# Patient Record
Sex: Female | Born: 1960 | Race: Black or African American | Hispanic: No | Marital: Married | Smoking: Former smoker
Health system: Southern US, Community
[De-identification: ages and names within clinical notes are randomized; demographics above are authoritative.]

## PROBLEM LIST (undated history)

## (undated) DIAGNOSIS — T7840XA Allergy, unspecified, initial encounter: Secondary | ICD-10-CM

## (undated) DIAGNOSIS — Z8701 Personal history of pneumonia (recurrent): Secondary | ICD-10-CM

## (undated) DIAGNOSIS — Z202 Contact with and (suspected) exposure to infections with a predominantly sexual mode of transmission: Secondary | ICD-10-CM

## (undated) DIAGNOSIS — D126 Benign neoplasm of colon, unspecified: Secondary | ICD-10-CM

## (undated) DIAGNOSIS — I38 Endocarditis, valve unspecified: Secondary | ICD-10-CM

## (undated) DIAGNOSIS — R21 Rash and other nonspecific skin eruption: Secondary | ICD-10-CM

## (undated) DIAGNOSIS — I10 Essential (primary) hypertension: Secondary | ICD-10-CM

## (undated) DIAGNOSIS — R609 Edema, unspecified: Secondary | ICD-10-CM

## (undated) DIAGNOSIS — L293 Anogenital pruritus, unspecified: Secondary | ICD-10-CM

## (undated) DIAGNOSIS — J189 Pneumonia, unspecified organism: Secondary | ICD-10-CM

## (undated) DIAGNOSIS — M1990 Unspecified osteoarthritis, unspecified site: Secondary | ICD-10-CM

## (undated) DIAGNOSIS — G4730 Sleep apnea, unspecified: Secondary | ICD-10-CM

## (undated) DIAGNOSIS — K219 Gastro-esophageal reflux disease without esophagitis: Secondary | ICD-10-CM

## (undated) DIAGNOSIS — K5730 Diverticulosis of large intestine without perforation or abscess without bleeding: Secondary | ICD-10-CM

## (undated) DIAGNOSIS — D649 Anemia, unspecified: Secondary | ICD-10-CM

## (undated) DIAGNOSIS — N949 Unspecified condition associated with female genital organs and menstrual cycle: Secondary | ICD-10-CM

## (undated) HISTORY — DX: Essential (primary) hypertension: I10

## (undated) HISTORY — DX: Edema, unspecified: R60.9

## (undated) HISTORY — DX: Benign neoplasm of colon, unspecified: D12.6

## (undated) HISTORY — PX: ROTATOR CUFF REPAIR: SHX139

## (undated) HISTORY — DX: Endocarditis, valve unspecified: I38

## (undated) HISTORY — DX: Unspecified condition associated with female genital organs and menstrual cycle: N94.9

## (undated) HISTORY — DX: Anogenital pruritus, unspecified: L29.3

## (undated) HISTORY — DX: Contact with and (suspected) exposure to infections with a predominantly sexual mode of transmission: Z20.2

## (undated) HISTORY — DX: Rash and other nonspecific skin eruption: R21

## (undated) HISTORY — DX: Allergy, unspecified, initial encounter: T78.40XA

## (undated) HISTORY — DX: Gastro-esophageal reflux disease without esophagitis: K21.9

## (undated) HISTORY — PX: KNEE ARTHROSCOPY: SUR90

## (undated) HISTORY — PX: WRIST SURGERY: SHX841

## (undated) HISTORY — DX: Sleep apnea, unspecified: G47.30

## (undated) HISTORY — PX: DIAGNOSTIC LAPAROSCOPY: SUR761

## (undated) HISTORY — DX: Unspecified osteoarthritis, unspecified site: M19.90

## (undated) HISTORY — PX: PARTIAL HYSTERECTOMY: SHX80

## (undated) HISTORY — PX: POLYPECTOMY: SHX149

## (undated) HISTORY — DX: Personal history of pneumonia (recurrent): Z87.01

## (undated) HISTORY — DX: Diverticulosis of large intestine without perforation or abscess without bleeding: K57.30

## (undated) HISTORY — DX: Anemia, unspecified: D64.9

---

## 1996-03-08 HISTORY — PX: CARDIAC VALVE SURGERY: SHX40

## 1998-06-11 ENCOUNTER — Ambulatory Visit (HOSPITAL_COMMUNITY): Admission: RE | Admit: 1998-06-11 | Discharge: 1998-06-11 | Payer: Self-pay | Admitting: *Deleted

## 1998-07-24 ENCOUNTER — Ambulatory Visit (HOSPITAL_COMMUNITY): Admission: RE | Admit: 1998-07-24 | Discharge: 1998-07-24 | Payer: Self-pay | Admitting: Gastroenterology

## 1998-08-08 ENCOUNTER — Encounter: Payer: Self-pay | Admitting: Gastroenterology

## 1998-08-08 ENCOUNTER — Ambulatory Visit (HOSPITAL_COMMUNITY): Admission: RE | Admit: 1998-08-08 | Discharge: 1998-08-08 | Payer: Self-pay | Admitting: Gastroenterology

## 1999-03-29 ENCOUNTER — Encounter: Payer: Self-pay | Admitting: Emergency Medicine

## 1999-03-29 ENCOUNTER — Emergency Department (HOSPITAL_COMMUNITY): Admission: EM | Admit: 1999-03-29 | Discharge: 1999-03-29 | Payer: Self-pay | Admitting: Emergency Medicine

## 2000-11-22 ENCOUNTER — Encounter: Payer: Self-pay | Admitting: Family Medicine

## 2000-11-22 ENCOUNTER — Ambulatory Visit (HOSPITAL_COMMUNITY): Admission: RE | Admit: 2000-11-22 | Discharge: 2000-11-22 | Payer: Self-pay | Admitting: Family Medicine

## 2001-07-16 ENCOUNTER — Emergency Department (HOSPITAL_COMMUNITY): Admission: EM | Admit: 2001-07-16 | Discharge: 2001-07-16 | Payer: Self-pay | Admitting: Emergency Medicine

## 2001-09-05 HISTORY — PX: COLONOSCOPY: SHX174

## 2001-09-15 ENCOUNTER — Encounter: Payer: Self-pay | Admitting: Internal Medicine

## 2001-09-15 ENCOUNTER — Emergency Department (HOSPITAL_COMMUNITY): Admission: EM | Admit: 2001-09-15 | Discharge: 2001-09-15 | Payer: Self-pay | Admitting: Emergency Medicine

## 2002-10-30 ENCOUNTER — Encounter: Payer: Self-pay | Admitting: Family Medicine

## 2002-10-30 ENCOUNTER — Ambulatory Visit (HOSPITAL_COMMUNITY): Admission: RE | Admit: 2002-10-30 | Discharge: 2002-10-30 | Payer: Self-pay | Admitting: Family Medicine

## 2002-12-07 HISTORY — PX: KNEE SURGERY: SHX244

## 2005-02-01 ENCOUNTER — Ambulatory Visit: Payer: Self-pay | Admitting: Family Medicine

## 2005-02-08 ENCOUNTER — Ambulatory Visit: Payer: Self-pay | Admitting: Family Medicine

## 2005-04-15 ENCOUNTER — Ambulatory Visit: Payer: Self-pay | Admitting: Family Medicine

## 2005-04-15 ENCOUNTER — Encounter: Payer: Self-pay | Admitting: Family Medicine

## 2005-04-15 ENCOUNTER — Other Ambulatory Visit: Admission: RE | Admit: 2005-04-15 | Discharge: 2005-04-15 | Payer: Self-pay | Admitting: Family Medicine

## 2005-04-15 LAB — CONVERTED CEMR LAB: Pap Smear: NORMAL

## 2005-04-19 ENCOUNTER — Ambulatory Visit (HOSPITAL_COMMUNITY): Admission: RE | Admit: 2005-04-19 | Discharge: 2005-04-19 | Payer: Self-pay | Admitting: Family Medicine

## 2005-07-09 ENCOUNTER — Ambulatory Visit: Payer: Self-pay | Admitting: Family Medicine

## 2005-10-06 ENCOUNTER — Ambulatory Visit: Payer: Self-pay | Admitting: Gastroenterology

## 2005-10-06 HISTORY — PX: RECTAL POLYPECTOMY: SHX2309

## 2005-10-26 ENCOUNTER — Ambulatory Visit: Payer: Self-pay | Admitting: Gastroenterology

## 2005-10-26 ENCOUNTER — Encounter (INDEPENDENT_AMBULATORY_CARE_PROVIDER_SITE_OTHER): Payer: Self-pay | Admitting: Gastroenterology

## 2005-10-26 LAB — HM COLONOSCOPY

## 2005-12-25 ENCOUNTER — Ambulatory Visit: Payer: Self-pay | Admitting: Family Medicine

## 2006-03-15 ENCOUNTER — Ambulatory Visit: Payer: Self-pay | Admitting: Family Medicine

## 2006-03-22 ENCOUNTER — Ambulatory Visit: Payer: Self-pay | Admitting: Family Medicine

## 2006-03-22 LAB — CONVERTED CEMR LAB
Albumin: 3.7 g/dL (ref 3.5–5.2)
BUN: 11 mg/dL (ref 6–23)
Chloride: 103 meq/L (ref 96–112)
Creatinine, Ser: 1 mg/dL (ref 0.4–1.2)
GFR calc non Af Amer: 64 mL/min
MCHC: 33.3 g/dL (ref 30.0–36.0)
Monocytes Absolute: 0.4 10*3/uL (ref 0.2–0.7)
Monocytes Relative: 6.2 % (ref 3.0–11.0)
Phosphorus: 3.7 mg/dL (ref 2.3–4.6)
RBC: 4.23 M/uL (ref 3.87–5.11)
RDW: 12 % (ref 11.5–14.6)
TSH: 0.75 microintl units/mL (ref 0.35–5.50)

## 2006-04-18 ENCOUNTER — Ambulatory Visit: Payer: Self-pay | Admitting: Family Medicine

## 2006-04-18 LAB — CONVERTED CEMR LAB
Ferritin: 112.4 ng/mL (ref 10.0–291.0)
Hemoglobin: 12.8 g/dL (ref 12.0–15.0)
RBC: 4.34 M/uL (ref 3.87–5.11)

## 2006-04-20 ENCOUNTER — Ambulatory Visit (HOSPITAL_COMMUNITY): Admission: RE | Admit: 2006-04-20 | Discharge: 2006-04-20 | Payer: Self-pay | Admitting: Family Medicine

## 2006-05-16 ENCOUNTER — Ambulatory Visit: Payer: Self-pay | Admitting: Family Medicine

## 2006-10-26 ENCOUNTER — Encounter: Payer: Self-pay | Admitting: Family Medicine

## 2006-10-26 DIAGNOSIS — Z860100 Personal history of colon polyps, unspecified: Secondary | ICD-10-CM | POA: Insufficient documentation

## 2006-10-26 DIAGNOSIS — K5730 Diverticulosis of large intestine without perforation or abscess without bleeding: Secondary | ICD-10-CM

## 2006-10-26 DIAGNOSIS — K219 Gastro-esophageal reflux disease without esophagitis: Secondary | ICD-10-CM | POA: Insufficient documentation

## 2006-10-26 DIAGNOSIS — D649 Anemia, unspecified: Secondary | ICD-10-CM | POA: Insufficient documentation

## 2006-10-26 DIAGNOSIS — I10 Essential (primary) hypertension: Secondary | ICD-10-CM | POA: Insufficient documentation

## 2006-10-27 ENCOUNTER — Telehealth (INDEPENDENT_AMBULATORY_CARE_PROVIDER_SITE_OTHER): Payer: Self-pay | Admitting: *Deleted

## 2006-11-03 ENCOUNTER — Telehealth (INDEPENDENT_AMBULATORY_CARE_PROVIDER_SITE_OTHER): Payer: Self-pay | Admitting: *Deleted

## 2007-01-17 ENCOUNTER — Ambulatory Visit: Payer: Self-pay | Admitting: Family Medicine

## 2007-01-17 DIAGNOSIS — N949 Unspecified condition associated with female genital organs and menstrual cycle: Secondary | ICD-10-CM | POA: Insufficient documentation

## 2007-01-17 DIAGNOSIS — J019 Acute sinusitis, unspecified: Secondary | ICD-10-CM

## 2007-02-01 ENCOUNTER — Telehealth: Payer: Self-pay | Admitting: Family Medicine

## 2007-04-03 ENCOUNTER — Encounter (INDEPENDENT_AMBULATORY_CARE_PROVIDER_SITE_OTHER): Payer: Self-pay | Admitting: *Deleted

## 2007-05-19 ENCOUNTER — Telehealth: Payer: Self-pay | Admitting: Family Medicine

## 2007-09-25 ENCOUNTER — Telehealth (INDEPENDENT_AMBULATORY_CARE_PROVIDER_SITE_OTHER): Payer: Self-pay | Admitting: *Deleted

## 2007-10-03 ENCOUNTER — Ambulatory Visit: Payer: Self-pay | Admitting: Family Medicine

## 2007-10-03 ENCOUNTER — Ambulatory Visit: Payer: Self-pay | Admitting: *Deleted

## 2007-10-03 DIAGNOSIS — L293 Anogenital pruritus, unspecified: Secondary | ICD-10-CM

## 2007-10-03 LAB — CONVERTED CEMR LAB: KOH Prep: NEGATIVE

## 2007-10-04 LAB — CONVERTED CEMR LAB: GC Probe Amp, Genital: NEGATIVE

## 2007-11-16 ENCOUNTER — Telehealth (INDEPENDENT_AMBULATORY_CARE_PROVIDER_SITE_OTHER): Payer: Self-pay | Admitting: *Deleted

## 2008-10-11 ENCOUNTER — Ambulatory Visit: Payer: Self-pay | Admitting: Family Medicine

## 2008-10-11 DIAGNOSIS — R609 Edema, unspecified: Secondary | ICD-10-CM

## 2008-10-18 ENCOUNTER — Ambulatory Visit: Payer: Self-pay | Admitting: Family Medicine

## 2009-11-06 ENCOUNTER — Ambulatory Visit: Payer: Self-pay | Admitting: Family Medicine

## 2009-11-06 DIAGNOSIS — R21 Rash and other nonspecific skin eruption: Secondary | ICD-10-CM | POA: Insufficient documentation

## 2010-02-04 ENCOUNTER — Encounter (INDEPENDENT_AMBULATORY_CARE_PROVIDER_SITE_OTHER): Payer: Self-pay | Admitting: *Deleted

## 2010-02-04 ENCOUNTER — Ambulatory Visit: Payer: Self-pay | Admitting: Family Medicine

## 2010-02-04 ENCOUNTER — Telehealth: Payer: Self-pay | Admitting: Internal Medicine

## 2010-02-04 DIAGNOSIS — K921 Melena: Secondary | ICD-10-CM

## 2010-02-05 ENCOUNTER — Ambulatory Visit: Payer: Self-pay | Admitting: Gastroenterology

## 2010-02-05 DIAGNOSIS — K625 Hemorrhage of anus and rectum: Secondary | ICD-10-CM

## 2010-02-05 LAB — CONVERTED CEMR LAB
Alkaline Phosphatase: 46 units/L (ref 39–117)
Basophils Absolute: 0 10*3/uL (ref 0.0–0.1)
Bilirubin, Direct: 0.1 mg/dL (ref 0.0–0.3)
Calcium: 9 mg/dL (ref 8.4–10.5)
Creatinine, Ser: 1 mg/dL (ref 0.4–1.2)
Eosinophils Absolute: 0.1 10*3/uL (ref 0.0–0.7)
GFR calc non Af Amer: 78.23 mL/min (ref >60)
HCT: 33.9 % — ABNORMAL LOW (ref 36.0–46.0)
Lymphs Abs: 3.4 10*3/uL (ref 0.7–4.0)
MCHC: 34 g/dL (ref 30.0–36.0)
MCV: 85.4 fL (ref 78.0–100.0)
Monocytes Absolute: 0.1 10*3/uL (ref 0.1–1.0)
Monocytes Relative: 1 % — ABNORMAL LOW (ref 3.0–12.0)
Platelets: 257 10*3/uL (ref 150.0–400.0)
RDW: 12.7 % (ref 11.5–14.6)
Sodium: 141 meq/L (ref 135–145)
Total Bilirubin: 0.4 mg/dL (ref 0.3–1.2)

## 2010-02-06 ENCOUNTER — Ambulatory Visit: Payer: Self-pay | Admitting: Gastroenterology

## 2010-02-10 ENCOUNTER — Encounter: Payer: Self-pay | Admitting: Gastroenterology

## 2010-04-07 NOTE — Miscellaneous (Signed)
Summary: anusol supp  Clinical Lists Changes  Medications: Added new medication of ANUSOL-HC 25 MG  SUPP (HYDROCORTISONE ACETATE) use daily as needed - Signed Rx of ANUSOL-HC 25 MG  SUPP (HYDROCORTISONE ACETATE) use daily as needed;  #10 x 1;  Signed;  Entered by: Oda Cogan RN;  Authorized by: Louis Meckel MD;  Method used: [facility] to [facility] [phone]*, [address], [place], [place], [place]  [address], Ph: [phone] or [phone], Fax: [phone]    Prescriptions: ANUSOL-HC 25 MG  SUPP (HYDROCORTISONE ACETATE) use daily as needed  #10 x 1   Entered by:   Oda Cogan RN   Authorized by:   Louis Meckel MD   Signed by:   Oda Cogan RN on 02/06/2010   Method used:   [facility]ronically to        [facility] [phone]* (retail)       [address]       [place], [place]  [address]       Ph: [phone] or [phone]       Fax: [phone]   RxID:   [id number]

## 2010-04-07 NOTE — Letter (Signed)
Summary: [facility] Instructions  [facility]  [address] [place], [place] [address]   Phone: [phone]  Fax: [phone]       Crystal Tucker    1960-08-02    MRN: [id number]        Procedure Day /Date: 02-06-2010     Arrival Time: 8:00 AM      Procedure Time: 9:00 AM     Location of Procedure:                    X      [facility] (4th Floor)                       PREPARATION FOR COLONOSCOPY WITH MOVIPREP     THE DAY BEFORE YOUR PROCEDURE         DATE: 02-05-2010 DAY: Thursday  1.  Drink clear liquids the entire day-NO SOLID FOOD  2.  Do not drink anything colored red or purple.  Avoid juices with pulp.  No orange juice.  3.  Drink at least 64 oz. (8 glasses) of fluid/clear liquids during the day to prevent dehydration and help the prep work efficiently.  CLEAR LIQUIDS INCLUDE: Water Jello Ice Popsicles Tea (sugar ok, no milk/cream) Powdered fruit flavored drinks Coffee (sugar ok, no milk/cream) Gatorade Juice: apple, white grape, white cranberry  Lemonade Clear bullion, consomm, broth Carbonated beverages (any kind) Strained chicken noodle soup Hard Candy                             4.  In the morning, mix first dose of MoviPrep solution:    Empty 1 Pouch A and 1 Pouch B into the disposable container    Add lukewarm drinking water to the top line of the container. Mix to dissolve    Refrigerate (mixed solution should be used within 24 hrs)  5.  Begin drinking the prep at 5:00 p.m. The MoviPrep container is divided by 4 marks.   Every 15 minutes drink the solution down to the next mark (approximately 8 oz) until the full liter is complete.   6.  Follow completed prep with 16 oz of clear liquid of your choice (Nothing red or purple).  Continue to drink clear liquids until bedtime.  7.  Before going to bed, mix second dose of MoviPrep solution:    Empty 1 Pouch A and 1 Pouch B into the disposable container    Add  lukewarm drinking water to the top line of the container. Mix to dissolve    Refrigerate  THE DAY OF YOUR PROCEDURE      DATE: 02-06-2010 DAY: Friday  Beginning at 5:30 AM  (5 hours before procedure):         1. Every 15 minutes, drink the solution down to the next mark (approx 8 oz) until the full liter is complete.  2. Follow completed prep with 16 oz. of clear liquid of your choice.    3. You may drink clear liquids until 8:30 AM  (2 HOURS BEFORE PROCEDURE).    MEDICATION INSTRUCTIONS  Unless otherwise instructed, you should take regular prescription medications with a small sip of water   as early as possible the morning of your procedure.         OTHER INSTRUCTIONS  You will need a responsible adult at least 50 years of age to accompany you and drive  you home.   This person must remain in the waiting room during your procedure.  Wear loose fitting clothing that is easily removed.  Leave jewelry and other valuables at home.  However, you may wish to bring a book to read or  an iPod/MP3 player to listen to music as you wait for your procedure to start.  Remove all body piercing jewelry and leave at home.  Total time from sign-in until discharge is approximately 2-3 hours.  You should go home directly after your procedure and rest.  You can resume normal activities the  day after your procedure.  The day of your procedure you should not:   Drive   Make legal decisions   Operate machinery   Drink alcohol   Return to work  You will receive specific instructions about eating, activities and medications before you leave.    The above instructions have been reviewed and explained to me by   _______________________    I fully understand and can verbalize these instructions _____________________________ Date _________  Appended Document: Colonoscopy     Procedures Next Due Date:    Colonoscopy: 02/2015

## 2010-04-07 NOTE — Letter (Signed)
Summary: Patient Notice- Polyp Results  [facility]  [address] [place], [place] [address]   Phone: [phone]  Fax: [phone]        February 10, 2010 MRN: [id number]    CAMARIE MCTIGUE [address] [place], [place]  [address]    Dear Ms. Dipierro,  I am pleased to inform you that the colon polyp(s) removed during your recent colonoscopy was (were) found to be benign (no cancer detected) upon pathologic examination.  I recommend you have a repeat colonoscopy examination in 5_ years to look for recurrent polyps, as having colon polyps increases your risk for having recurrent polyps or even colon cancer in the future.  Should you develop new or worsening symptoms of abdominal pain, bowel habit changes or bleeding from the rectum or bowels, please schedule an evaluation with either your primary care physician or with me.  Additional information/recommendations:  __ No further action with gastroenterology is needed at this time. Please      follow-up with your primary care physician for your other healthcare      needs.  __ Please call [phone] to schedule a return visit to review your      situation.  __ Please keep your follow-up visit as already scheduled.  __x Continue treatment plan as outlined the day of your exam.  Please call us if you are having persistent problems or have questions about your condition that have not been fully answered at this time.  Sincerely,  Louis Meckel MD  This letter has been electronically signed by your physician.  Appended Document: Patient Notice- Polyp Results Letter mailed

## 2010-04-07 NOTE — Progress Notes (Signed)
Summary: Doc of the day   Phone Note Call from Patient   Caller: [facility]  [phone] Call For: Dr. Marina Goodell Summary of Call: 1 day rectal bleeding and blood in stool. Hx of polyps. Requesting pt. be seen in 1-2 days Initial call taken by: Karna Christmas,  February 04, 2010 12:16 PM  Follow-up for Phone Call        Pt's last colon done in Aug2007 by Dr.Kress . Message left for Shirlee Limerick to call back and we can get pt. in with N.P. tomorrow and the assigned supervising Dr.will become her dr. Follow-up by: Teryl Lucy RN,  February 04, 2010 1:36 PM  Additional Follow-up for Phone Call Additional follow up Details #1::        Pt. given appt. with N.P. for tomorrow. Shirlee Limerick will notify pt. Additional Follow-up by: Teryl Lucy RN,  February 04, 2010 2:28 PM

## 2010-04-07 NOTE — Assessment & Plan Note (Signed)
Summary: BLOOD IN STOOL/CLE   Vital Signs:  Patient profile:   50 year old female Height:      67.5 inches Weight:      227.50 pounds BMI:     35.23 Temp:     98.4 degrees F oral Pulse rate:   76 / minute Pulse rhythm:   regular BP sitting:   160 / 92  (left arm) Cuff size:   large  Vitals Entered By: Selena Batten Dance CMA Kaiea Esselman Dull) (February 04, 2010 11:25 AM) CC: Blood in stool Comments Noted bright red blood this A.M.   History of Present Illness: CC: blood in stool  This am noted bright red blood in stool.  + clots, + stool, looser stool than normal.  No black tarry stool.  Blood all over commode.  Had second stool and still blood, + blood with wiping.  No pain with stooling.  Never had anything like this before.  Slight constipation 1 wk ago with minimal blood with wiping, nothing like today.    No change in diet recently.  + gaining weight.  No fevers/chills, NS, abd pain, n/v.  No melena.  No recent NSAID use.  no feelings of incomplete emptying.  h/o rectal polyp (2007) and colon polyps, last colonoscopy pt unsure but in chart seems to be 2003.  Current Medications (verified): 1)  Omeprazole 20 Mg  Cpdr (Omeprazole) .Marland Kitchen.. 1 By Mouth Every Day. 2)  Nasonex 50 Mcg/act  Susp (Mometasone Furoate) .... Use As Directed 3)  Hydrochlorothiazide 25 Mg  Tabs (Hydrochlorothiazide) .... Take One By Mouth Daily 4)  Tylenol Arthritis Pain 650 Mg Cr-Tabs (Acetaminophen) .... Otc As Directed. 5)  Potassium Gluconate 595 Mg Tabs (Potassium Gluconate) .... Otc Takes One Daily  Allergies: 1)  ! 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  Physical Exam  General:  Well-developed,well-nourished,in no acute distress; alert,appropriate and cooperative throughout examination Eyes:  PERRLA, EOMI, + injection, no conjunctival pallor Mouth:  MMM, no pharyngeal erythema Lungs:  Normal respiratory effort, chest expands symmetrically. Lungs are clear to auscultation, no crackles or wheezes. Heart:  Normal rate and regular rhythm. S1 and S2 normal without gallop, murmur, click, rub or other extra sounds. Abdomen:  Bowel sounds positive,abdomen soft and non-tender without masses, organomegaly or hernias noted. Rectal:  Normal sphincter tone. No rectal masses or tenderness. + inferior external hemorrhoid, noninflammed.  No stool in vault.  + apparent dry red blood on gloved finger on rectal exam Pulses:  2+ rad pulses Extremities:  no pedal edema Skin:  Intact without suspicious lesions or rashes   Impression & Recommendations:  Problem # 1:  HEMATOCHEZIA (ICD-578.1) in 50 yo with h/o colon polyps, rectal polyp, diverticulosis.  no fever/abd pain to suggest diverticulitis.  Looks stable currently, vitals stable (hypertensive but anxious).  referral to GI for eval, seems to  be due for colonoscopy.  Advised red flags to go to ER.  Check CBC, if Hgb low, send to ER.  rectal with ext hemorrhoid, but no bleeding evident on exam.  ? AVM vs diverticulosis bleed.  Orders: TLB-CBC Platelet - w/Differential (85025-CBCD) TLB-BMP (Basic Metabolic Panel-BMET) (80048-METABOL) TLB-Hepatic/Liver Function Pnl  (80076-HEPATIC) Gastroenterology Referral (GI)  Complete Medication List: 1)  Omeprazole 20 Mg Cpdr (Omeprazole) .Marland Kitchen.. 1 by mouth every day. 2)  Nasonex 50 Mcg/act Susp (Mometasone furoate) .... Use as directed 3)  Hydrochlorothiazide 25 Mg Tabs (Hydrochlorothiazide) .... Take one by mouth daily 4)  Tylenol Arthritis Pain 650 Mg Cr-tabs (Acetaminophen) .... Otc as directed. 5)  Potassium Gluconate 595 Mg Tabs (Potassium gluconate) .... Otc takes one daily  Patient Instructions: 1)  We will send you to see GI within next few days. 2)  Blood work today. 3)  Pass by Marion's office to make appointment. 4)  If bleeding continues, or you start feeling ill, dizzy, short of breath or fatigued, please go to ER for evaluation. 5)  Good to meet you today.   Orders Added: 1)  TLB-CBC Platelet - w/Differential [85025-CBCD] 2)  TLB-BMP (Basic Metabolic Panel-BMET) [80048-METABOL] 3)  TLB-Hepatic/Liver Function Pnl [80076-HEPATIC] 4)  Est. Patient Level IV [54098] 5)  Gastroenterology Referral [GI]    Current Allergies (reviewed today): ! * CITRUS FRUIT AND WATERMELON

## 2010-04-07 NOTE — Procedures (Signed)
Summary: Colonoscopy    Colonoscopy  Procedure date:  10/26/2005  Findings:      Results: Polyp.  Results: Diverticulosis.       Location:  [facility].    Comments:      Repeat colonoscopy in 5 years.   Procedures Next Due Date:    Colonoscopy: 11/2010   Patient Name: Crystal Tucker, Crystal Tucker. MRN:  Procedure Procedures: Colonoscopy CPT: [phone].  Personnel: Endoscopist: Ulyess Mort, MD.  Exam Location: Exam performed in Outpatient Clinic. Outpatient  Patient Consent: Procedure, Alternatives, Risks and Benefits discussed, consent obtained, from patient. Consent was obtained by the RN.  Indications  Evaluation of: Anemia  Surveillance of: Adenomatous Polyp(s).  History  Current Medications: Patient is not currently taking Coumadin.  Pre-Exam Physical: Performed Sep 28, 2001. Entire physical exam was normal. Rectal exam, Abdominal exam, Extremity exam, Mental status exam WNL.  Comments: Pt. history reviewed/updated, physical exam performed prior to initiation of sedation? Exam Exam: Extent of exam reached: Cecum, extent intended: Cecum.  The cecum was identified by appendiceal orifice and IC valve. Colon retroflexion performed. Images taken. ASA Classification: II. Tolerance: good.  Monitoring: Pulse and BP monitoring, Oximetry used. Supplemental O2 given.  Sedation Meds: Patient assessed and found to be appropriate for moderate (conscious) sedation. Fentanyl given IV. Versed given IV.  Findings - DIVERTICULOSIS: Transverse Colon to Sigmoid Colon. ICD9: Diverticulosis, Colon: 562.10. Comments: mild.  POLYP: Rectum, Maximum size: 6 mm. sessile polyp. Procedure:  snare with cautery, removed, retrieved, Polyp sent to pathology. ICD9: Colon Polyps: 211.3.   Assessment Abnormal examination, see findings above.  Diagnoses: 211.3: Colon Polyps.  562.10: Diverticulosis, Colon.   Events  Unplanned Interventions: No intervention was required.    Unplanned Events: There were no complications. Plans Medication Plan: Await pathology. Continue current medications.  Patient Education: Patient given standard instructions for: Polyps. Diverticulosis. Patient instructed to get routine colonoscopy every 5 years.  Disposition: After procedure patient sent to recovery. After recovery patient sent home.    cc: Roxy Manns, MD  This report was created from the original endoscopy report, which was reviewed and signed by the above listed endoscopist.

## 2010-04-07 NOTE — Assessment & Plan Note (Signed)
Summary: RASH   Vital Signs:  Patient profile:   50 year old female Temp:     98.1 degrees F oral Pulse rate:   82 / minute Pulse rhythm:   regular BP sitting:   138 / 80  (left arm) Cuff size:   large  Vitals Entered By: Selena Batten Dance CMA (AAMA) (November 06, 2009 1:48 PM) CC: Rash/ ? chicken pox   History of Present Illness: No known h/o varicella.  Rash started/noticed on Sunday.  Was working and church and had some tea with lemon- h/o fruit allergy.  This was differnent from typical reaction per patient. Not SOB.   No FCNAV.  Itches, worse if patient gets hot.  Using calamine with some relief.  No new soaps, detergents.  No others with similar symptoms.   Current Medications (verified): 1)  Omeprazole 20 Mg  Cpdr (Omeprazole) .... 1 By Mouth Every Day. 2)  Nasonex 50 Mcg/act  Susp (Mometasone Furoate) .... Use As Directed 3)  Hydrochlorothiazide 25 Mg  Tabs (Hydrochlorothiazide) .... Take One By Mouth Daily 4)  Tylenol Arthritis Pain 650 Mg Cr-Tabs (Acetaminophen) .... Otc As Directed. 5)  Potassium Gluconate 595 Mg Tabs (Potassium Gluconate) .... Otc Takes One Daily  Allergies: 1)  ! * Citrus Fruit and Watermelon  Review of Systems       See HPI.  Otherwise negative.    Physical Exam  General:  GEN: nad, alert and oriented HEENT: mucous membranes moist NECK: supple w/o LA CV: rrr.  midline scar noted PULM: ctab, no inc wob ABD: soft, +bs EXT: no edema SKIN: excoriated lesion on arms/legs.  I lesion with blister intact on dorum of r foot between 4th and 5th digit.      Impression & Recommendations:  Problem # 1:  SKIN RASH (ICD-782.1) LIkely scabies.  Will treat presumptively and have patient follow up as needed .  Routine instructions given re:the meds and laundry/clothes.  She agrees/understands.   Complete Medication List: 1)  Omeprazole 20 Mg Cpdr (Omeprazole) .... 1 by mouth every day. 2)  Nasonex 50 Mcg/act Susp (Mometasone furoate) .... Use as directed 3)   Hydrochlorothiazide 25 Mg Tabs (Hydrochlorothiazide) .... Take one by mouth daily 4)  Tylenol Arthritis Pain 650 Mg Cr-tabs (Acetaminophen) .... Otc as directed. 5)  Potassium Gluconate 595 Mg Tabs (Potassium gluconate) .... Otc takes one daily 6)  Permethrin 5 % Crea (Permethrin) .... Massage into skin from head to feet, make sure to get medicine between the toes and fingers. may repeat in 14 days  Patient Instructions: 1)  Take benadryl for the itching and use the cream from head to toe.  Wash it off after 12 hours.  Wash your sheets and recently worn clothing in the washing machine.  You can repeat the cream in about 10-14 days if needed.  Prescriptions: PERMETHRIN 5 % CREA (PERMETHRIN) massage into skin from head to feet, make sure to get medicine between the toes and fingers. may repeat in 14 days  #60g x 1   Entered and Authorized by:   Graham Duncan MD   Signed by:   Graham Duncan MD on 11/06/2009   Method used:   Electronically to        [facility]  [address] (retail)       [address]       [place], [place]  [address]       Ph: [phone]  Fax: [phone]   RxID:   [id number]   Current Allergies (reviewed today): ! * CITRUS FRUIT AND WATERMELON

## 2010-04-09 NOTE — Procedures (Signed)
Summary: Colonoscopy  Patient: Crystal Tucker Note: All result statuses are Final unless otherwise noted.  Tests: (1) Colonoscopy (COL)   COL Colonoscopy           DONE     [facility]     [address]     [place], [place]  [address]           COLONOSCOPY PROCEDURE REPORT           PATIENT:  Crystal Tucker, Crystal Tucker  MR#:  [id number]     BIRTHDATE:  Jul 28, 1960, 49 yrs. old  GENDER:  female           ENDOSCOPIST:  Barbette Hair. Arlyce Dice, MD     Referred by:           PROCEDURE DATE:  02/06/2010     PROCEDURE:  Colonoscopy with snare polypectomy     ASA CLASS:  Class II     INDICATIONS:  1) rectal bleeding           MEDICATIONS:   Fentanyl 75 mcg IV, Versed 8 mg IV           DESCRIPTION OF PROCEDURE:   After the risks benefits and     alternatives of the procedure were thoroughly explained, informed     consent was obtained.  Digital rectal exam was performed and     revealed external hemorrhoids.   The LB 180AL [id number] endoscope     was introduced through the anus and advanced to the cecum, which     was identified by both the appendix and ileocecal valve, without     limitations.  The quality of the prep was good, using MoviPrep.     The instrument was then slowly withdrawn as the colon was fully     examined.     <<PROCEDUREIMAGES>>           FINDINGS:  A sessile polyp was found in the ascending colon. It     was 3 mm in size. Polyp was snared without cautery. Retrieval was     successful (see image2). snare polyp  A sessile polyp was found in     the descending colon. It was 3 mm in size. Polyp was snared     without cautery. Retrieval was successful (see image11). snare     polyp  Moderate diverticulosis was found in the ascending colon     (see image8).  Moderate diverticulosis was found in the sigmoid     colon (see image7).  This was otherwise a normal examination of     the colon (see image4, image10, and image15).   Retroflexed views     in the rectum revealed no  abnormalities.    The time to cecum =     6.0  minutes. The scope was then withdrawn (time =  6.25  min) from     the patient and the procedure completed.           COMPLICATIONS:  None           ENDOSCOPIC IMPRESSION:     1) 3 mm sessile polyp in the ascending colon     2) 3 mm sessile polyp in the descending colon     3) Moderate diverticulosis in the ascending colon     4) Moderate diverticulosis in the sigmoid colon     5) Otherwise normal examination           Rectal bleeding secondary to  hemorrhoids           RECOMMENDATIONS:     1) If the polyp(s) removed today are proven to be adenomatous     (pre-cancerous) polyps, you will need a repeat colonoscopy in 5     years. Otherwise you should continue to follow colorectal cancer     screening guidelines for "routine risk" patients with colonoscopy     in 10 years.     2) Anusol HC supp. prn     3) Sedation with MAC for future procedures           REPEAT EXAM:   You will receive a letter from Dr. Arlyce Dice in 1-2     weeks, after reviewing the final pathology, with followup     recommendations.           ______________________________     Barbette Hair Arlyce Dice, MD           CC: Judy Pimple, MD           n.     Rosalie DoctorBarbette Hair. Kaplan at 02/06/2010 11:46 AM           Metta Clines, [id number]  Note: An exclamation mark (!) indicates a result that was not dispersed into the flowsheet. Document Creation Date: 02/06/2010 11:47 AM _______________________________________________________________________  (1) Order result status: Final Collection or observation date-time: 02/06/2010 11:34 Requested date-time:  Receipt date-time:  Reported date-time:  Referring Physician:   Ordering Physician: Melvia Heaps [phone]) Specimen Source:  Source: Launa Grill Order Number: [id number] Lab site:   Appended Document: Colonoscopy     Procedures Next Due Date:    Colonoscopy: 02/2015

## 2010-04-09 NOTE — Assessment & Plan Note (Signed)
Summary: rectal bleeding xiday and blood in stool    History of Present Illness Visit Type: new patient  Primary GI MD: Melvia Heaps MD [facility] Primary Provider: Judith Part MD Requesting Provider: na Chief Complaint: BRB when patient wipes and in the stool after BMs, bloating, and GERD  History of Present Illness:   Last seen in 2007 for colonoscopy which she had done for history of polyps. She is is for evaluation of painless rectal bleeding. Yesterday she had two episodes of bleeding within 15 minutes. Her stool was soft and she had not been constipated at any point prior. Weight is up. She takes Tylenol Arthritis.  She also complains of frequent GERD symptoms. Takes Omeprazole OTC when she can afftord it. Heartburn usually depends on what she eats.   GI Review of Systems    Reports acid reflux, bloating, and  heartburn.      Denies abdominal pain, belching, chest pain, dysphagia with liquids, dysphagia with solids, loss of appetite, nausea, vomiting, vomiting blood, weight loss, and  weight gain.      Reports rectal bleeding.     Denies anal fissure, black tarry stools, change in bowel habit, constipation, diarrhea, diverticulosis, fecal incontinence, heme positive stool, hemorrhoids, irritable bowel syndrome, jaundice, light color stool, liver problems, and  rectal pain.    Current Medications (verified): 1)  Cvs Omeprazole 20 Mg Tbec (Omeprazole) .... One By Mouth As Needed 2)  Nasonex 50 Mcg/act  Susp (Mometasone Furoate) .... Use As Directed 3)  Hydrochlorothiazide 25 Mg  Tabs (Hydrochlorothiazide) .... Take One By Mouth Daily 4)  Tylenol Arthritis Pain 650 Mg Cr-Tabs (Acetaminophen) .... Otc As Directed. 5)  Potassium Gluconate 595 Mg Tabs (Potassium Gluconate) .... Otc Takes One Daily  Allergies (verified): 1)  ! * Citrus Fruit and Watermelon  Past History:  Past Medical History: Anemia-NOS GERD Hypertension 211.3: Colon Polyps 562.10: Diverticulosis, Colon SKIN  RASH (ICD-782.1) DEPENDENT EDEMA, LEGS, BILATERAL (ICD-782.3) SEXUALLY TRANSMITTED DISEASE, EXPOSURE TO (ICD-V01.6) PRURITUS, VAGINAL (ICD-698.1) UNSPEC SYMPTOM ASSOC W/FEMALE GENITAL ORGANS (ICD-625.9) SINUSITIS- ACUTE-NOS (ICD-461.9) HYPERTENSION (ICD-401.9) Arthritis Hx of Pneumonia   Past Surgical History: Reviewed history from 10/26/2006 and no changes required. 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  Alcohol Use - yes: 1 daily  Daily Caffeine Use: 1 daily  Illicit Drug Use - no Drug Use:  no  Review of Systems       The patient complains of allergy/sinus, arthritis/joint pain, back pain, headaches-new, night sweats, sleeping problems, sore throat, and swelling of feet/legs.  The patient denies anemia, anxiety-new, blood in urine, breast changes/lumps, change in vision, confusion, cough, coughing up blood, depression-new, fainting, fatigue, fever, hearing problems, heart murmur, heart rhythm changes, itching, menstrual pain, muscle pains/cramps, nosebleeds, pregnancy symptoms, shortness of breath, skin rash, swollen lymph glands, thirst - excessive , urination - excessive , urination changes/pain, urine leakage, vision changes, and voice change.    Vital Signs:  Patient profile:   50 year old female Height:      67.5 inches Weight:      227 pounds BMI:     35.16 BSA:     2.15 Pulse rate:   96 / minute Pulse rhythm:   regular BP sitting:  142 / 84  (left arm) Cuff size:   large  Vitals Entered By: Ok Anis CMA (February 05, 2010 8:55 AM)  Physical Exam  General:  Well developed, well nourished, no acute distress. Head:  Normocephalic and  atraumatic. Eyes:  Conjunctiva pink, no icterus.  Neck:  no obvious masses  Lungs:  Clear throughout to auscultation. Heart:  Regular rate and rhythm; no murmurs, rubs,  or bruits. Abdomen:  Soft, nontender and nondistended. No masses, hepatosplenomegaly or hernias noted. Normal bowel sounds. Rectal:  Soft, dark red stool in vautl. Unable to see much on anoscopy because of the stool  Msk:  Symmetrical with no gross deformities. Normal posture. Extremities:  No palmar erythema, no edema.  Neurologic:  Alert and  oriented x4;  grossly normal neurologically. Skin:  Intact without significant lesions or rashes. Psych:  Alert and cooperative. Normal mood and affect.   Impression & Recommendations:  Problem # 1:  RECTAL BLEEDING (ICD-569.3) Assessment New Painless rectal bleeding, two episodes yesterday, none since. Dark red stool in vault. Could be internal hemorrhoids, unable to see anything anoscopically because of the stool. Could be diverticular or hemorrhoidal. For further evaluation the patient will be scheduled for a colonoscopy with biopsies/polypectomy (if indicated).  The risks and benefits of the procedure, as well as alternatives were discussed with the patient and she agrees to proceed.  Orders: Colonoscopy (Colon)  Problem # 2:  COLONIC POLYPS, HX OF (ICD-V12.72) Assessment: Comment Only  Problem # 3:  GERD (ICD-530.81) Samples of PPI given.   Patient Instructions: 1)  We have  given you a sample ofPrilosec OTC  once daily, 30 min prior to breakfast. 2)  We schedueld you for the Colonoscopy with Dr. Arlyce Dice, for tomorrow 02-06-10. 3)  Directions and brochure provided. 4)  [facility] Patient Information Guide given to patient. 5)  Copy sent to : Judith Part, MD 6)  The medication list was reviewed and reconciled.  All changed / newly prescribed medications were explained.  A complete medication list was provided to the patient / caregiver.

## 2010-06-03 ENCOUNTER — Other Ambulatory Visit: Payer: Self-pay | Admitting: Family Medicine

## 2010-06-05 ENCOUNTER — Other Ambulatory Visit: Payer: Self-pay | Admitting: *Deleted

## 2010-06-05 NOTE — Telephone Encounter (Signed)
Note opened in error.

## 2010-07-24 NOTE — Assessment & Plan Note (Signed)
[facility]                                   ON-CALL NOTE   NAME:Crystal Tucker, Crystal Tucker                       MRN:          [id number]  DATE:12/25/2005                            DOB:          1960-12-28    PRIMARY CARE DOCTOR:  Dr. Milinda Antis.   PHONE NUMBER:  [phone]   SUBJECTIVE:  The patient is shaking all over, light-headed.  It started this  morning.  The patient also feels dizzy and weak.  Has no headache.  No  fevers or chills.   ASSESSMENT AND PLAN:  The patient come in to [facility].       Kerby Nora, MD      AB/MedQ  DD:  12/26/2005  DT:  12/27/2005  Job #:  [id number]

## 2010-10-04 ENCOUNTER — Other Ambulatory Visit: Payer: Self-pay | Admitting: Family Medicine

## 2010-10-05 NOTE — Telephone Encounter (Signed)
[facility] request refill HCTZ 25mg  #30 x1 given with note pt needs to call for appt.

## 2010-11-05 ENCOUNTER — Encounter: Payer: Self-pay | Admitting: Family Medicine

## 2010-11-06 ENCOUNTER — Ambulatory Visit (INDEPENDENT_AMBULATORY_CARE_PROVIDER_SITE_OTHER): Payer: Self-pay | Admitting: Family Medicine

## 2010-11-06 ENCOUNTER — Encounter: Payer: Self-pay | Admitting: Family Medicine

## 2010-11-06 DIAGNOSIS — J019 Acute sinusitis, unspecified: Secondary | ICD-10-CM | POA: Insufficient documentation

## 2010-11-06 DIAGNOSIS — I10 Essential (primary) hypertension: Secondary | ICD-10-CM

## 2010-11-06 LAB — CBC WITH DIFFERENTIAL/PLATELET
Basophils Absolute: 0.1 10*3/uL (ref 0.0–0.1)
HCT: 38.1 % (ref 36.0–46.0)
Lymphocytes Relative: 55.1 % — ABNORMAL HIGH (ref 12.0–46.0)
Lymphs Abs: 3.4 10*3/uL (ref 0.7–4.0)
Monocytes Relative: 4.9 % (ref 3.0–12.0)
Neutrophils Relative %: 37.2 % — ABNORMAL LOW (ref 43.0–77.0)
Platelets: 255 10*3/uL (ref 150.0–400.0)
RDW: 13.9 % (ref 11.5–14.6)
WBC: 6.2 10*3/uL (ref 4.5–10.5)

## 2010-11-06 LAB — COMPREHENSIVE METABOLIC PANEL
ALT: 19 U/L (ref 0–35)
Albumin: 4.1 g/dL (ref 3.5–5.2)
Alkaline Phosphatase: 43 U/L (ref 39–117)
CO2: 29 mEq/L (ref 19–32)
GFR: 92.08 mL/min (ref >60.00)
Potassium: 3.4 mEq/L — ABNORMAL LOW (ref 3.5–5.1)
Sodium: 142 mEq/L (ref 135–145)
Total Bilirubin: 0.4 mg/dL (ref 0.3–1.2)
Total Protein: 7.1 g/dL (ref 6.0–8.3)

## 2010-11-06 LAB — LIPID PANEL
HDL: 62.3 mg/dL (ref >39.00)
Total CHOL/HDL Ratio: 3
VLDL: 20.2 mg/dL (ref 0.0–40.0)

## 2010-11-06 MED ORDER — AMOXICILLIN-POT CLAVULANATE 875-125 MG PO TABS: 1.0000 | ORAL_TABLET | Freq: Two times a day (BID) | ORAL | Status: AC

## 2010-11-06 MED ORDER — HYDROCHLOROTHIAZIDE 25 MG PO TABS: 25.0000 mg | ORAL_TABLET | Freq: Every day | ORAL | Status: DC

## 2010-11-06 NOTE — Patient Instructions (Signed)
Use nasal saline spray for congestion  Breathe steam  Drink lots of fluids Try mucinex for congestion Take augmentin for sinus infection- update me if not improving in a week Schedule follow up nurse bp check in 1 month please Lab today Keep up the good job with weight loss

## 2010-11-06 NOTE — Progress Notes (Signed)
Subjective:    Patient ID: Crystal Tucker, female    DOB: [birth year], 50 y.o.   MRN: [id number]  HPI Here for HTN follow up and also has sinus infection too  On Hctz Last labs nov 11  Sinus symptoms  Last Thursday - eyes felt runny and itchy with some discharge  Very irritated  Then started Saturday with some dizziness (mild room spinning)  Then post nasal drainage Now sluggish  Head aches -- pain in face between the eyes - now more in the back  Congestion with green d/c - and a little dry cough  Lots of sweats but no chills - ? If fever   Is having hot flashes and going through [employer] is down 2 lb  bp is 148/90 today Took her med Was higher than that first of the week  Fine until she got sick  No cp or palpitations or dizziness No more edema than usual  Patient Active Problem List  Diagnoses  . ANEMIA-NOS  . HYPERTENSION  . SINUSITIS- ACUTE-NOS  . GERD  . DIVERTICULOSIS, COLON  . RECTAL BLEEDING  . HEMATOCHEZIA  . UNSPEC SYMPTOM ASSOC W/FEMALE GENITAL ORGANS  . PRURITUS, VAGINAL  . SKIN RASH  . DEPENDENT EDEMA, LEGS, BILATERAL  . COLONIC POLYPS, HX OF  . Acute sinusitis   Past Medical History  Diagnosis Date  . Anemia     NOS  . GERD (gastroesophageal reflux disease)   . Hypertension   . Benign neoplasm of colon   . Diverticulosis of colon (without mention of hemorrhage)   . Rash and other nonspecific skin eruption   . Edema   . Contact with or exposure to venereal diseases   . Pruritus of genital organs   . Unspecified symptom associated with female genital organs   . Arthritis   . H/O: pneumonia    Past Surgical History  Procedure Date  . Knee arthroscopy     right  . Knee surgery 10/04  . Partial hysterectomy   . Cardiac valve surgery   . Rectal polypectomy 8/07  . Colonoscopy 7/03    polyps and tics   History  Substance Use Topics  . Smoking status: Former Games developer  . Smokeless tobacco: Not on file  . Alcohol Use: Yes     1  daily   Family History  Problem Relation Age of Onset  . Colon cancer Neg Hx    No Known Allergies Current Outpatient Prescriptions on File Prior to Visit  Medication Sig Dispense Refill  . acetaminophen (TYLENOL) 650 MG CR tablet Take 650 mg by mouth every 8 (eight) hours as needed.        . mometasone (NASONEX) 50 MCG/ACT nasal spray Place 2 sprays into the nose daily.        . potassium gluconate 595 MG TABS Take 595 mg by mouth daily.        . hydrocortisone (ANUSOL-HC) 25 MG suppository Place 25 mg rectally 2 (two) times daily.        Marland Kitchen omeprazole (PRILOSEC) 20 MG capsule Take 20 mg by mouth daily.           Review of Systems Review of Systems  Constitutional: Negative for fever, appetite change, fatigue and unexpected weight change.  Eyes: Negative for pain and visual disturbance.  ENT pos for congestion and sinus pain , neg for sore throat  Respiratory: Negative for  shortness of breath.  Pos for cough and throat clearing  Cardiovascular: Negative for cp or palpitations    Gastrointestinal: Negative for nausea, diarrhea and constipation.  Genitourinary: Negative for urgency and frequency.  Skin: Negative for pallor or rash   Neurological: Negative for weakness, light-headedness, numbness and pos for headache  Hematological: Negative for adenopathy. Does not bruise/bleed easily.  Psychiatric/Behavioral: Negative for dysphoric mood. The patient is not nervous/anxious.          Objective:   Physical Exam  Constitutional: She appears well-developed and well-nourished.  HENT:  Head: Normocephalic and atraumatic.  Right Ear: External ear normal.  Left Ear: External ear normal.  Mouth/Throat: Oropharynx is clear and moist.       Nares congested and injected with tenderness in frontal and maxillary sinuses   Eyes: Conjunctivae and EOM are normal. Pupils are equal, round, and reactive to light. Right eye exhibits no discharge. Left eye exhibits no discharge.  Neck: Normal  range of motion. Neck supple. No JVD present. Carotid bruit is not present. No thyromegaly present.  Cardiovascular: Normal rate, regular rhythm, normal heart sounds and intact distal pulses.  Exam reveals no gallop and no friction rub.   Pulmonary/Chest: Breath sounds normal. No respiratory distress. She has no wheezes.  Abdominal: Soft. Bowel sounds are normal. She exhibits no abdominal bruit.  Musculoskeletal: Normal range of motion. She exhibits edema. She exhibits no tenderness.       Trace pedal edema   Lymphadenopathy:    She has no cervical adenopathy.  Neurological: She is alert. No cranial nerve deficit.  Skin: Skin is warm and dry. No rash noted. No erythema. No pallor.  Psychiatric: She has a normal mood and affect.          Assessment & Plan:

## 2010-11-06 NOTE — Assessment & Plan Note (Signed)
bp up today from malaise and pseudoephedrine Adv to stop it  F/u 1 mo when better to re check it  Lab today Commended on wt loss !

## 2010-11-06 NOTE — Assessment & Plan Note (Signed)
After uri with facial pain and purulent d/c tx with augmentin Disc sympt care- see inst Update if not imp 1 wk

## 2010-11-10 ENCOUNTER — Telehealth: Payer: Self-pay | Admitting: *Deleted

## 2010-11-10 MED ORDER — FLUCONAZOLE 150 MG PO TABS: 150.0000 mg | ORAL_TABLET | Freq: Once | ORAL | Status: AC

## 2010-11-10 NOTE — Telephone Encounter (Signed)
Patient called and requested a rx for a yeast infection. Patient was given an antibiotic Friday and she always gets a yeast infection when she takes an antibiotic. Pharmacy-[facility]/[address]

## 2010-11-10 NOTE — Telephone Encounter (Signed)
Patient advised as instructed via telephone. 

## 2010-11-10 NOTE — Telephone Encounter (Signed)
Diflucan sent electronically  Please follow up if not improved

## 2010-11-11 ENCOUNTER — Other Ambulatory Visit: Payer: Self-pay | Admitting: Family Medicine

## 2010-11-11 DIAGNOSIS — E876 Hypokalemia: Secondary | ICD-10-CM

## 2010-12-08 ENCOUNTER — Ambulatory Visit (INDEPENDENT_AMBULATORY_CARE_PROVIDER_SITE_OTHER): Payer: Self-pay | Admitting: *Deleted

## 2010-12-08 ENCOUNTER — Ambulatory Visit: Payer: Self-pay

## 2010-12-08 VITALS — BP 160/82 | HR 74

## 2010-12-08 DIAGNOSIS — I10 Essential (primary) hypertension: Secondary | ICD-10-CM

## 2010-12-08 NOTE — Progress Notes (Signed)
Patient was here for nurse visit BP check.  BP was 160/82, patient stated that she is still having sinus pressure, drainage, headache and cough.  No fever, no ear pain.  She has already completed a course 12 day course of Amoxicillin.  Spoke with Dr. Milinda Antis and she wants patient to schedule a f/u appt with her, first available.  Advised patient to stop at front desk and schedule appt on her way out.

## 2010-12-15 ENCOUNTER — Encounter: Payer: Self-pay | Admitting: Family Medicine

## 2010-12-15 ENCOUNTER — Ambulatory Visit (INDEPENDENT_AMBULATORY_CARE_PROVIDER_SITE_OTHER): Payer: Self-pay | Admitting: Family Medicine

## 2010-12-15 ENCOUNTER — Other Ambulatory Visit: Payer: Self-pay

## 2010-12-15 DIAGNOSIS — I10 Essential (primary) hypertension: Secondary | ICD-10-CM

## 2010-12-15 DIAGNOSIS — E876 Hypokalemia: Secondary | ICD-10-CM

## 2010-12-15 DIAGNOSIS — J019 Acute sinusitis, unspecified: Secondary | ICD-10-CM

## 2010-12-15 LAB — POTASSIUM: Potassium: 3.5 mEq/L (ref 3.5–5.1)

## 2010-12-15 MED ORDER — LISINOPRIL 10 MG PO TABS: 10.0000 mg | ORAL_TABLET | Freq: Every day | ORAL | Status: DC

## 2010-12-15 MED ORDER — AMOXICILLIN-POT CLAVULANATE 875-125 MG PO TABS: 1.0000 | ORAL_TABLET | Freq: Two times a day (BID) | ORAL | Status: AC

## 2010-12-15 NOTE — Progress Notes (Signed)
Subjective:    Patient ID: Crystal Tucker, female    DOB: 25-Jan-1961, 50 y.o.   MRN: [id number]  HPI Here for f/u of elevated blood pressure  Last visit 160/82 on sudafed and sick Today 148/94 first check  Has a headache too -- and has been having them - nagging little  Has not been checking it  bmi 35- gained 3 lb (had lost some previously)  Thinks she still has a sinus infection  Still blowing green and spitting a lot of mucous  Headaches but no fever Improved but still lingering   Lots of stress Husband cheated / never apoligized  Needs to plan a divorce    K 3.4-- has been eating high K foods for that  No cramping or other symptoms Is on diuretic   Lipids LDL 111 Lab Results  Component Value Date   CHOL 193 11/06/2010   Lab Results  Component Value Date   HDL 62.30 11/06/2010   Lab Results  Component Value Date   LDLCALC 111* 11/06/2010   Lab Results  Component Value Date   TRIG 101.0 11/06/2010   Lab Results  Component Value Date   CHOLHDL 3 11/06/2010   No results found for this basename: LDLDIRECT   not bad -- is in exercise class/ x box -- different programs and kickboxing  and working on diet  Doing weigh ins and has health coach- 3 times per week    Gluc 100  tsh normal   No insurance  Wants to hold off on flu shot - perhaps get cheaper at the health dept   Patient Active Problem List  Diagnoses  . ANEMIA-NOS  . HYPERTENSION  . GERD  . DIVERTICULOSIS, COLON  . HEMATOCHEZIA  . DEPENDENT EDEMA, LEGS, BILATERAL  . COLONIC POLYPS, HX OF  . Acute sinusitis  . Hypokalemia   Past Medical History  Diagnosis Date  . Anemia     NOS  . GERD (gastroesophageal reflux disease)   . Hypertension   . Benign neoplasm of colon   . Diverticulosis of colon (without mention of hemorrhage)   . Rash and other nonspecific skin eruption   . Edema   . Contact with or exposure to venereal diseases   . Pruritus of genital organs   . Unspecified symptom  associated with female genital organs   . Arthritis   . H/O: pneumonia    Past Surgical History  Procedure Date  . Knee arthroscopy     right  . Knee surgery 10/04  . Partial hysterectomy   . Cardiac valve surgery   . Rectal polypectomy 8/07  . Colonoscopy 7/03    polyps and tics   History  Substance Use Topics  . Smoking status: Former Games developer  . Smokeless tobacco: Not on file  . Alcohol Use: Yes     1 daily   Family History  Problem Relation Age of Onset  . Colon cancer Neg Hx    No Known Allergies Current Outpatient Prescriptions on File Prior to Visit  Medication Sig Dispense Refill  . acetaminophen (TYLENOL) 650 MG CR tablet Take 650 mg by mouth every 8 (eight) hours as needed.        . hydrochlorothiazide 25 MG tablet Take 1 tablet (25 mg total) by mouth daily.  30 tablet  11  . mometasone (NASONEX) 50 MCG/ACT nasal spray Place 2 sprays into the nose daily.        Marland Kitchen omeprazole (PRILOSEC) 20 MG capsule Take  20 mg by mouth daily.        . potassium gluconate 595 MG TABS Take 595 mg by mouth daily.        . hydrocortisone (ANUSOL-HC) 25 MG suppository Place 25 mg rectally 2 (two) times daily.        [place] Lanius CAPS Take 1 capsule by mouth daily.            Review of Systems Review of Systems  Constitutional: Negative for fever, appetite change, fatigue and unexpected weight change.  Eyes: Negative for pain and visual disturbance.  ENT pos for nasal congestion and sinus pain - improved some, no ear pain or ST Respiratory: Negative for cough and shortness of breath.   Cardiovascular: Negative for cp or palpitations    Gastrointestinal: Negative for nausea, diarrhea and constipation.  Genitourinary: Negative for urgency and frequency.  Skin: Negative for pallor or rash   Neurological: Negative for weakness, light-headedness, numbness and pos for headaches   Hematological: Negative for adenopathy. Does not bruise/bleed easily.  Psychiatric/Behavioral: Negative for  dysphoric mood. The patient is not nervous/anxious.          Objective:   Physical Exam  Constitutional: She appears well-developed and well-nourished. No distress.       overwt and well appearing   HENT:  Head: Normocephalic and atraumatic.  Right Ear: External ear normal.  Left Ear: External ear normal.  Nose: Nose normal.  Mouth/Throat: Oropharynx is clear and moist.       Mild nasal congestion Mild maxillary sinus tenderness  Eyes: Conjunctivae and EOM are normal. Pupils are equal, round, and reactive to light. Right eye exhibits no discharge. Left eye exhibits no discharge.  Neck: Normal range of motion. Neck supple. No JVD present. Carotid bruit is not present. No thyromegaly present.  Cardiovascular: Normal rate, regular rhythm, normal heart sounds and intact distal pulses.   Pulmonary/Chest: Effort normal and breath sounds normal. No respiratory distress. She has no wheezes.  Abdominal: Soft. Bowel sounds are normal. She exhibits no abdominal bruit and no mass.  Lymphadenopathy:    She has no cervical adenopathy.  Neurological: She is alert. She has normal reflexes. No cranial nerve deficit. Coordination normal.  Skin: Skin is warm and dry. No rash noted. No erythema. No pallor.  Psychiatric:       Pt is tearful at times discussing problems with marriage and stressors Good eye contact and comm skills          Assessment & Plan:

## 2010-12-15 NOTE — Assessment & Plan Note (Signed)
Improved but not resolved Will tx with additional 7 days of augmentin and update if not improved  sympt care disc

## 2010-12-15 NOTE — Patient Instructions (Signed)
Take 7 more days of augmentin for sinus infection  Update me if that does not help  Continue hctz Checking potassium level today Start lisinopril 10 mg daily- update if any problems or side effects  Follow up in 4-6 weeks for hypertension visit  Get flu shot at the health department  I sent px to your pharmacy  Keep up the good diet and exercise

## 2010-12-15 NOTE — Assessment & Plan Note (Signed)
Still elevated  Urged to work further on lifestyle change Stress may also play a role - long disc about that - cannot afford counseling at this time  Will add ace and continue hct  Plan labs and f/u  Pt has no ins currently so will try to stay generic

## 2010-12-15 NOTE — Assessment & Plan Note (Signed)
Check this today - after inc K in diet Suspect from hct

## 2011-01-12 ENCOUNTER — Ambulatory Visit: Payer: Self-pay | Admitting: Family Medicine

## 2011-05-18 ENCOUNTER — Other Ambulatory Visit: Payer: Self-pay | Admitting: Family Medicine

## 2011-05-18 DIAGNOSIS — Z1231 Encounter for screening mammogram for malignant neoplasm of breast: Secondary | ICD-10-CM

## 2011-06-15 ENCOUNTER — Ambulatory Visit (HOSPITAL_COMMUNITY)
Admission: RE | Admit: 2011-06-15 | Discharge: 2011-06-15 | Disposition: A | Discharge status: Home / Self Care | Payer: BC Managed Care – PPO | Source: Ambulatory Visit | Attending: Family Medicine | Admitting: Family Medicine

## 2011-06-15 DIAGNOSIS — Z1231 Encounter for screening mammogram for malignant neoplasm of breast: Secondary | ICD-10-CM | POA: Insufficient documentation

## 2011-06-17 ENCOUNTER — Encounter: Payer: Self-pay | Admitting: *Deleted

## 2011-06-24 ENCOUNTER — Telehealth: Payer: Self-pay | Admitting: Family Medicine

## 2011-06-24 ENCOUNTER — Ambulatory Visit (INDEPENDENT_AMBULATORY_CARE_PROVIDER_SITE_OTHER): Payer: BC Managed Care – PPO | Admitting: Family Medicine

## 2011-06-24 ENCOUNTER — Encounter: Payer: Self-pay | Admitting: Family Medicine

## 2011-06-24 VITALS — BP 130/80 | HR 91 | Temp 98.7°F | Wt 227.0 lb

## 2011-06-24 DIAGNOSIS — R05 Cough: Secondary | ICD-10-CM

## 2011-06-24 MED ORDER — ALBUTEROL SULFATE HFA 108 (90 BASE) MCG/ACT IN AERS: 2.0000 | INHALATION_SPRAY | Freq: Four times a day (QID) | RESPIRATORY_TRACT | Status: DC | PRN

## 2011-06-24 NOTE — Telephone Encounter (Signed)
If severe symptoms or trouble breathing go to UC tonight - otherwise fit in with first avail tommorrow

## 2011-06-24 NOTE — Patient Instructions (Signed)
I think you have a virus.  This should get better.  Use the inhaler every 6 hours if needed for cough or wheeze.  Take care.

## 2011-06-24 NOTE — Progress Notes (Signed)
duration of symptoms: 2 weeks Rhinorrhea: yes Congestion: yes ear pain: no but stuffy sore throat: mild Cough: yes, minimal sputum in AM Myalgias:yes, diffuse other concerns: known sick contact Chest is aching.  Wheeze with the cough.    ROS: See HPI.  Otherwise negative.    Meds, vitals, and allergies reviewed.   GEN: nad, alert and oriented HEENT: mucous membranes moist, TM w/o erythema, nasal epithelium injected, OP with cobblestoning, B SOM noted.  NECK: supple w/o LA CV: rrr. PULM: ctab, no inc wob, dry cough better with SABA use here in office (sample) ABD: soft, +bs EXT: no edema

## 2011-06-24 NOTE — Telephone Encounter (Signed)
Caller: Crystal Tucker/Patient; PCP: Roxy Manns A.; CB#: [phone]; ; ; Call regarding Cough/Congestion;  Has cough since "last week". Has "slight" shortness of breath/wheezing. Cough is productive with green mucus. Afebrile. No available appointments. Office/Phyllis notified and advised appointment at 1545.

## 2011-06-25 DIAGNOSIS — R05 Cough: Secondary | ICD-10-CM | POA: Insufficient documentation

## 2011-06-25 NOTE — Assessment & Plan Note (Signed)
Likely viral, lungs clear and cough improved with SABA.  No need for abx.  F/u prn.  Nontoxic.  D/w pt.  Routine SABA use/instrutions d/w pt, understood.

## 2011-06-25 NOTE — Telephone Encounter (Signed)
Patient was seen by dr Para March yesterday

## 2011-07-13 ENCOUNTER — Telehealth: Payer: Self-pay | Admitting: Family Medicine

## 2011-07-13 NOTE — Telephone Encounter (Signed)
Caller: Sanskriti/Patient; Phone Number: [phone]; Message from caller: Has concerns with Snoring and wants to know what can be done for this.

## 2011-07-13 NOTE — Telephone Encounter (Signed)
Please have her f/u to discuss

## 2011-07-13 NOTE — Telephone Encounter (Signed)
Spoke with patient appt scheduled for her to see Dr. Milinda Antis on 07/16/2011.

## 2011-07-16 ENCOUNTER — Encounter: Payer: Self-pay | Admitting: Family Medicine

## 2011-07-16 ENCOUNTER — Ambulatory Visit (INDEPENDENT_AMBULATORY_CARE_PROVIDER_SITE_OTHER): Payer: BC Managed Care – PPO | Admitting: Family Medicine

## 2011-07-16 VITALS — BP 128/78 | HR 80 | Temp 98.2°F | Wt 230.5 lb

## 2011-07-16 DIAGNOSIS — R0989 Other specified symptoms and signs involving the circulatory and respiratory systems: Secondary | ICD-10-CM

## 2011-07-16 DIAGNOSIS — G4733 Obstructive sleep apnea (adult) (pediatric): Secondary | ICD-10-CM | POA: Insufficient documentation

## 2011-07-16 DIAGNOSIS — H6980 Other specified disorders of Eustachian tube, unspecified ear: Secondary | ICD-10-CM

## 2011-07-16 DIAGNOSIS — R0683 Snoring: Secondary | ICD-10-CM

## 2011-07-16 DIAGNOSIS — J309 Allergic rhinitis, unspecified: Secondary | ICD-10-CM

## 2011-07-16 NOTE — Progress Notes (Signed)
Subjective:    Patient ID: Crystal Tucker, female    DOB: 06-19-1960, 51 y.o.   MRN: [id number]  HPI Here for visit for snoring and ear problem   R ear has been hurting - and a lot of sinus congestion  Fluid behind it too  Post nasal drip  Has had a virus too - several times  Pollen allergy- worse after working in the yard  Had some wheezing   Has albuterol inhaler too   Is using nasonex nasal spray  Also saline spray Also vics   No antihistamines  Snoring issues- going on for a while  Husband complains about it a lot  Recently stayed with family- she kept them up all night long   Also has pauses in breathing  Also stays tired  Does not wake up very refreshed - does not think she sleeps well in general Tosses and turns Brain keeps going too  Has gained a lot of weight - this may play a role   Patient Active Problem List  Diagnoses  . ANEMIA-NOS  . HYPERTENSION  . GERD  . DIVERTICULOSIS, COLON  . HEMATOCHEZIA  . DEPENDENT EDEMA, LEGS, BILATERAL  . COLONIC POLYPS, HX OF  . Hypokalemia  . Cough  . ETD (eustachian tube dysfunction)  . Allergic rhinitis  . Snoring   Past Medical History  Diagnosis Date  . Anemia     NOS  . GERD (gastroesophageal reflux disease)   . Hypertension   . Benign neoplasm of colon   . Diverticulosis of colon (without mention of hemorrhage)   . Rash and other nonspecific skin eruption   . Edema   . Contact with or exposure to venereal diseases   . Pruritus of genital organs   . Unspecified symptom associated with female genital organs   . Arthritis   . H/O: pneumonia    Past Surgical History  Procedure Date  . Knee arthroscopy     right  . Knee surgery 10/04  . Partial hysterectomy   . Cardiac valve surgery   . Rectal polypectomy 8/07  . Colonoscopy 7/03    polyps and tics   History  Substance Use Topics  . Smoking status: Former Games developer  . Smokeless tobacco: Not on file  . Alcohol Use: Yes     1 daily   Family  History  Problem Relation Age of Onset  . Colon cancer Neg Hx    Allergies  Allergen Reactions  . Lisinopril     cough   Current Outpatient Prescriptions on File Prior to Visit  Medication Sig Dispense Refill  . acetaminophen (TYLENOL) 650 MG CR tablet Take 650 mg by mouth every 8 (eight) hours as needed.        Marland Kitchen albuterol (PROVENTIL HFA;VENTOLIN HFA) 108 (90 BASE) MCG/ACT inhaler Inhale 2 puffs into the lungs every 6 (six) hours as needed for wheezing.  1 Inhaler  0  . hydrochlorothiazide 25 MG tablet Take 1 tablet (25 mg total) by mouth daily.  30 tablet  11  . mometasone (NASONEX) 50 MCG/ACT nasal spray Place 2 sprays into the nose daily.        Marland Kitchen omeprazole (PRILOSEC) 20 MG capsule Take 20 mg by mouth daily.        . potassium gluconate 595 MG TABS Take 595 mg by mouth daily.               Review of Systems Review of Systems  Constitutional: Negative for fever,  appetite change, fatigue and unexpected weight change.  Eyes: Negative for pain and visual disturbance.  ENT pos for nasal cong/ sneezing and post nasal drip and snoring and plugged feeling ears  Respiratory: Negative for cough and shortness of breath.   Cardiovascular: Negative for cp or palpitations    Gastrointestinal: Negative for nausea, diarrhea and constipation.  Genitourinary: Negative for urgency and frequency.  Skin: Negative for pallor or rash   Neurological: Negative for weakness, light-headedness, numbness and headaches.  Hematological: Negative for adenopathy. Does not bruise/bleed easily.  Psychiatric/Behavioral: Negative for dysphoric mood. The patient is not nervous/anxious.          Objective:   Physical Exam  Constitutional: She appears well-developed and well-nourished. No distress.       Obese and well appearing   HENT:  Head: Normocephalic and atraumatic.  Mouth/Throat: Oropharynx is clear and moist. No oropharyngeal exudate.       Nares are injected and congested  bilat TM effusions,  no erythema  No sinus tenderness   Eyes: Conjunctivae and EOM are normal. Pupils are equal, round, and reactive to light. Right eye exhibits no discharge. Left eye exhibits no discharge.  Neck: Normal range of motion. Neck supple. No JVD present. Carotid bruit is not present. No thyromegaly present.  Cardiovascular: Normal rate, regular rhythm, normal heart sounds and intact distal pulses.  Exam reveals no gallop.   Pulmonary/Chest: Effort normal and breath sounds normal. No respiratory distress. She has no wheezes. She has no rales.  Abdominal: Soft. Bowel sounds are normal. She exhibits no distension, no abdominal bruit and no mass. There is no tenderness.  Musculoskeletal: She exhibits no edema.  Lymphadenopathy:    She has no cervical adenopathy.  Neurological: She is alert. She has normal reflexes. No cranial nerve deficit. She exhibits normal muscle tone. Coordination normal.  Skin: Skin is warm and dry. No rash noted. No erythema. No pallor.  Psychiatric: She has a normal mood and affect.          Assessment & Plan:

## 2011-07-16 NOTE — Patient Instructions (Signed)
Continue nasonex twice daily Add zyrtec 10 mg at bedtime though the allergy season  Ear should start to clear- if it does not , let me know  Work on weight loss with diet and exercise We will do referral to pulmonary at check out

## 2011-07-17 NOTE — Assessment & Plan Note (Signed)
Suspect from allergic rhinitis- will add zyrtec and update if worse or no further imp

## 2011-07-17 NOTE — Assessment & Plan Note (Signed)
For the heavy pollen season, recommended adding zyrtec to the nasonex daily  Update if not starting to improve in a week or if worsening  (or if s/s of bacterial inf) This may also help the snoring some

## 2011-07-17 NOTE — Assessment & Plan Note (Signed)
With day time somnolence/ witnessed apnea/ unrefreshed ams and obesity Suspect sleep apnea Ref to sleep clinic for further eval

## 2011-08-12 ENCOUNTER — Encounter: Payer: Self-pay | Admitting: Pulmonary Disease

## 2011-08-12 ENCOUNTER — Ambulatory Visit (INDEPENDENT_AMBULATORY_CARE_PROVIDER_SITE_OTHER): Payer: BC Managed Care – PPO | Admitting: Pulmonary Disease

## 2011-08-12 VITALS — BP 138/82 | HR 68 | Temp 97.9°F | Ht 69.0 in | Wt 230.4 lb

## 2011-08-12 DIAGNOSIS — R0683 Snoring: Secondary | ICD-10-CM

## 2011-08-12 DIAGNOSIS — R0989 Other specified symptoms and signs involving the circulatory and respiratory systems: Secondary | ICD-10-CM

## 2011-08-12 DIAGNOSIS — G4733 Obstructive sleep apnea (adult) (pediatric): Secondary | ICD-10-CM

## 2011-08-12 NOTE — Assessment & Plan Note (Signed)
The patient's history is very suggestive of clinically significant sleep apnea.  I have had a long discussion with her about the pathophysiology of sleep apnea, including its impact to her quality of life cardiovascular health.  At this point, I would highly recommend a sleep study for evaluation.  The patient is agreeable to this approach.  We'll arrange followup once the results are available.

## 2011-08-12 NOTE — Patient Instructions (Signed)
Will schedule for a sleep study, and arrange followup once results are available. Work on weight loss.  

## 2011-08-12 NOTE — Progress Notes (Signed)
  Subjective:    Patient ID: Crystal Tucker, female    DOB: 13-Sep-1960, 51 y.o.   MRN: [id number]  HPI The patient is a 50 year old female who I've been asked to see for possible obstructive sleep apnea.  The patient has been noted to have loud snoring, as well as an abnormal breathing pattern during sleep.  She has frequent awakenings at night, and does not feel rested upon arising in the mornings.  She notes slight pressure on occasion during the day with periods of inactivity, and will sometimes take naps during the day.  She also has some sleepiness in the evenings before 8 PM if she sits down to watch television or read.  She then gets her "second wind" for the rest of the night.  The patient denies any sleepiness with driving.  Of note, the patient's weight is up 40 pounds over the last 2 years, and her Epworth score today is only 7.  Sleep Questionnaire: What time do you typically go to bed?( Between what hours) 9-11 pm How long does it take you to fall asleep? 30 minutes How many times during the night do you wake up? 4 What time do you get out of bed to start your day? 0500 Do you drive or operate heavy machinery in your occupation? No How much has your weight changed (up or down) over the past two years? (In pounds) 40 lb (18.144 kg) Have you ever had a sleep study before? No Do you currently use CPAP? No Do you wear oxygen at any time? No    Review of Systems  Constitutional: Positive for unexpected weight change. Negative for fever.  HENT: Positive for ear pain, congestion, sore throat and sneezing. Negative for nosebleeds, rhinorrhea, trouble swallowing, dental problem, postnasal drip and sinus pressure.        Change in color of mucus  Eyes: Negative.  Negative for redness and itching.  Respiratory: Positive for shortness of breath. Negative for cough, chest tightness and wheezing.   Cardiovascular: Negative.  Negative for palpitations and leg swelling.  Gastrointestinal: Negative for  nausea and vomiting.       Heartburn Indigestion   Genitourinary: Negative.  Negative for dysuria.  Musculoskeletal: Positive for arthralgias. Negative for joint swelling.  Skin: Negative.  Negative for rash.  Neurological: Negative.  Negative for headaches.  Hematological: Negative.  Does not bruise/bleed easily.  Psychiatric/Behavioral: Negative.  Negative for dysphoric mood. The patient is not nervous/anxious.        Objective:   Physical Exam Constitutional:  Overweight female, no acute distress  HENT:  Nares patent without discharge, but large turbinates bilat.   Oropharynx without exudate, palate and uvula are elongated.  +tonsillar hypertrophy.  Eyes:  Perrla, eomi, no scleral icterus  Neck:  No JVD, no TMG  Cardiovascular:  Normal rate, regular rhythm, no rubs or gallops.  No murmurs        Intact distal pulses  Pulmonary :  Normal breath sounds, no stridor or respiratory distress   No rales, rhonchi, or wheezing  Abdominal:  Soft, nondistended, bowel sounds present.  No tenderness noted.   Musculoskeletal:  No lower extremity edema noted.  Lymph Nodes:  No cervical lymphadenopathy noted  Skin:  No cyanosis noted  Neurologic:  Alert, appropriate, moves all 4 extremities without obvious deficit.         Assessment & Plan:

## 2011-08-25 ENCOUNTER — Ambulatory Visit (HOSPITAL_BASED_OUTPATIENT_CLINIC_OR_DEPARTMENT_OTHER): Payer: BC Managed Care – PPO | Attending: Pulmonary Disease | Admitting: Radiology

## 2011-08-25 VITALS — Ht 69.0 in | Wt 230.0 lb

## 2011-08-25 DIAGNOSIS — G4733 Obstructive sleep apnea (adult) (pediatric): Secondary | ICD-10-CM | POA: Insufficient documentation

## 2011-08-25 DIAGNOSIS — R0683 Snoring: Secondary | ICD-10-CM

## 2011-09-11 DIAGNOSIS — G4733 Obstructive sleep apnea (adult) (pediatric): Secondary | ICD-10-CM

## 2011-09-11 NOTE — Procedures (Signed)
Crystal Tucker, MOSHIER NO.:  [id number]  MEDICAL RECORD NO.:  [id number]          PATIENT TYPE:  OUT  LOCATION:  SLEEP CENTER                 FACILITY:  [facility]  PHYSICIAN:  Barbaraann Share, MD,FCCPDATE OF BIRTH:  05/18/1960  DATE OF STUDY:  08/25/2011                           NOCTURNAL POLYSOMNOGRAM  REFERRING PHYSICIAN:  Barbaraann Share, MD,FCCP  REFERRING PHYSICIAN:  Barbaraann Share, MD,FCCP  INDICATION FOR STUDY:  Hypersomnia with sleep apnea.  EPWORTH SLEEPINESS SCORE:  10.  SLEEP ARCHITECTURE:  The patient had a total sleep time of 362 minutes with no slow-wave sleep and adequate quantity of REM.  Sleep onset latency was normal at 12 minutes, and REM onset was fairly rapid at 3 minutes.  Sleep efficiency was adequate at 91%.  RESPIRATORY DATA:  The patient was found to have 27 apneas and 33 obstructive hypopneas, giving her an apnea-hypopnea index of 10 events per hour.  The events occurred primarily in the supine position, and during REM, and there was moderate to loud snoring noted throughout.  OXYGEN DATA:  There were O2 desaturation as low as 82% with the patient's obstructive events.  CARDIAC DATA:  Rare PAC noted, but no clinically significant arrhythmias were seen.  MOVEMENTS/PARASOMNIA:  The patient had no significant leg jerks or other abnormal behaviors noted.  IMPRESSION/RECOMMENDATION: 1. Mild obstructive sleep apnea/hypopnea syndrome with an AHI of 10     events per hour and O2 desaturation as low as 82%.  Treatment for     this degree of sleep apnea can include a trial of weight loss     alone, upper airway surgery, dental appliance, and also CPAP.     Clinical correlation is suggested. 2. Rare PAC, but no clinically significant arrhythmias were seen.     Barbaraann Share, MD,FCCP Diplomate, American Board of Sleep Medicine   [facility]MED[facility]  D:  09/11/2011 15:30:10  T:  09/11/2011 21:35:12  Job:  [id number]

## 2011-09-16 ENCOUNTER — Telehealth: Payer: Self-pay | Admitting: Pulmonary Disease

## 2011-09-16 NOTE — Telephone Encounter (Signed)
Pt is scheduled to come in and see [facility] 09/23/11 at 3:15 for sleep results

## 2011-09-23 ENCOUNTER — Encounter: Payer: Self-pay | Admitting: Pulmonary Disease

## 2011-09-23 ENCOUNTER — Ambulatory Visit (INDEPENDENT_AMBULATORY_CARE_PROVIDER_SITE_OTHER): Payer: BC Managed Care – PPO | Admitting: Pulmonary Disease

## 2011-09-23 VITALS — BP 156/100 | HR 72 | Temp 98.1°F | Ht 69.0 in | Wt 231.6 lb

## 2011-09-23 DIAGNOSIS — G4733 Obstructive sleep apnea (adult) (pediatric): Secondary | ICD-10-CM

## 2011-09-23 NOTE — Assessment & Plan Note (Signed)
The patient has only mild obstructive sleep apnea, but is symptomatic.  This really is not severe enough to represent a significant increase in cardiovascular risk, that can negatively impact her quality of life.  I have discussed with her the options of weight loss alone, upper airway surgery, dental appliance, and also CPAP.  She is most interested in possible surgery, especially since her tonsils and uvula are quite large and bothersome to her.  She intends to work on weight loss, but feels CPAP is her last option.  She would like to be referred to ENT for an upper airway exam and to answer her questions.  I have also encouraged her to work aggressively on weight loss.

## 2011-09-23 NOTE — Patient Instructions (Addendum)
Will refer to ENT to consider surgery for treatment of your sleep apnea, large tonsils, and sinus issues. Work hard on losing weight. If you decide to treat your sleep apnea conservatively, please call so that we can start cpap until you lose weight.

## 2011-09-23 NOTE — Progress Notes (Signed)
  Subjective:    Patient ID: Crystal Tucker, female    DOB: 1960/06/26, 51 y.o.   MRN: [id number]  HPI The patient comes in today for followup of her recent sleep study.  She was found to have mild obstructive sleep apnea with an AHI of 10 events per hour, and transient oxygen desaturation as low as 82%.  I have reviewed this study with her in detail, and answered all of her questions.   Review of Systems  Constitutional: Negative for fever and unexpected weight change.  HENT: Positive for rhinorrhea, postnasal drip and sinus pressure. Negative for ear pain, nosebleeds, congestion, sore throat, sneezing, trouble swallowing and dental problem.   Eyes: Negative for redness and itching.  Respiratory: Negative for cough, chest tightness, shortness of breath and wheezing.   Cardiovascular: Positive for leg swelling. Negative for palpitations.  Gastrointestinal: Negative for nausea and vomiting.  Genitourinary: Negative for dysuria.  Musculoskeletal: Negative for joint swelling.  Skin: Negative for rash.  Neurological: Negative for headaches.  Hematological: Does not bruise/bleed easily.  Psychiatric/Behavioral: Negative for dysphoric mood. The patient is not nervous/anxious.   All other systems reviewed and are negative.       Objective:   Physical Exam Obese female in no acute distress Nose without purulent discharge noted Oropharynx with very large tonsils, and elongation of her uvula. Lower extremities without significant edema, no cyanosis Alert and oriented, moves all 4 extremities.       Assessment & Plan:

## 2011-11-01 ENCOUNTER — Telehealth: Payer: Self-pay | Admitting: Family Medicine

## 2011-11-01 DIAGNOSIS — I10 Essential (primary) hypertension: Secondary | ICD-10-CM

## 2011-11-01 DIAGNOSIS — Z0001 Encounter for general adult medical examination with abnormal findings: Secondary | ICD-10-CM | POA: Insufficient documentation

## 2011-11-01 DIAGNOSIS — E876 Hypokalemia: Secondary | ICD-10-CM

## 2011-11-01 DIAGNOSIS — Z0000 Encounter for general adult medical examination without abnormal findings: Secondary | ICD-10-CM

## 2011-11-01 DIAGNOSIS — K219 Gastro-esophageal reflux disease without esophagitis: Secondary | ICD-10-CM

## 2011-11-01 NOTE — Telephone Encounter (Signed)
Message copied by Judy Pimple on Mon Nov 01, 2011  9:49 PM ------      Message from: Alvina Chou      Created: Tue Oct 26, 2011 12:45 PM      Regarding: Lab orders for Wednesday, 8.28.13       appt is for fasting labs, a K+ is ordered. Please order whatever else you may want. Thanks, T

## 2011-11-03 ENCOUNTER — Other Ambulatory Visit (INDEPENDENT_AMBULATORY_CARE_PROVIDER_SITE_OTHER): Payer: BC Managed Care – PPO

## 2011-11-03 DIAGNOSIS — Z0000 Encounter for general adult medical examination without abnormal findings: Secondary | ICD-10-CM

## 2011-11-03 DIAGNOSIS — E876 Hypokalemia: Secondary | ICD-10-CM

## 2011-11-03 LAB — CBC WITH DIFFERENTIAL/PLATELET
Basophils Absolute: 0 10*3/uL (ref 0.0–0.1)
Eosinophils Relative: 2 % (ref 0.0–5.0)
Monocytes Relative: 5.6 % (ref 3.0–12.0)
Neutrophils Relative %: 40.6 % — ABNORMAL LOW (ref 43.0–77.0)
Platelets: 227 10*3/uL (ref 150.0–400.0)
RDW: 13.7 % (ref 11.5–14.6)
WBC: 5.2 10*3/uL (ref 4.5–10.5)

## 2011-11-03 LAB — LIPID PANEL
HDL: 61.3 mg/dL (ref >39.00)
LDL Cholesterol: 119 mg/dL — ABNORMAL HIGH (ref 0–99)
Total CHOL/HDL Ratio: 3
VLDL: 15.8 mg/dL (ref 0.0–40.0)

## 2011-11-03 LAB — COMPREHENSIVE METABOLIC PANEL
ALT: 20 U/L (ref 0–35)
AST: 19 U/L (ref 0–37)
Alkaline Phosphatase: 46 U/L (ref 39–117)
Chloride: 102 mEq/L (ref 96–112)
Creatinine, Ser: 0.9 mg/dL (ref 0.4–1.2)
Total Bilirubin: 0.6 mg/dL (ref 0.3–1.2)

## 2011-11-03 LAB — TSH: TSH: 1.4 u[IU]/mL (ref 0.35–5.50)

## 2011-11-10 ENCOUNTER — Ambulatory Visit (INDEPENDENT_AMBULATORY_CARE_PROVIDER_SITE_OTHER): Payer: BC Managed Care – PPO | Admitting: Family Medicine

## 2011-11-10 ENCOUNTER — Encounter: Payer: Self-pay | Admitting: Family Medicine

## 2011-11-10 VITALS — BP 120/68 | HR 79 | Temp 98.3°F | Ht 66.0 in | Wt 228.5 lb

## 2011-11-10 DIAGNOSIS — E669 Obesity, unspecified: Secondary | ICD-10-CM | POA: Insufficient documentation

## 2011-11-10 DIAGNOSIS — E876 Hypokalemia: Secondary | ICD-10-CM

## 2011-11-10 DIAGNOSIS — Z0000 Encounter for general adult medical examination without abnormal findings: Secondary | ICD-10-CM

## 2011-11-10 DIAGNOSIS — Z23 Encounter for immunization: Secondary | ICD-10-CM

## 2011-11-10 DIAGNOSIS — I10 Essential (primary) hypertension: Secondary | ICD-10-CM

## 2011-11-10 NOTE — Progress Notes (Signed)
Subjective:    Patient ID: Crystal Tucker, female    DOB: 1960-11-17, 51 y.o.   MRN: [id number]  HPI Here for health maintenance exam and to review chronic medical problems   Is feeling ok overall   A lot of sinus drainage lately  Also had sleep study- and apnea is not as bad as they thought Disc cpap  Thought tonsils were the bigger problem  Will see ENT when she has copay Also needs to work on wt loss -- needs 60 lb  Wants to check ears today- stays light headed occas   Wt is down 3 lb- is working  She may want to go to a weight loss lifestyle clinic  Not really exercising much right now    K 3.4- she is taking K gluconate    Chemistry      Component Value Date/Time   NA 138 11/03/2011 0839   K 3.4* 11/03/2011 0839   CL 102 11/03/2011 0839   CO2 28 11/03/2011 0839   BUN 15 11/03/2011 0839   CREATININE 0.9 11/03/2011 0839      Component Value Date/Time   CALCIUM 9.2 11/03/2011 0839   ALKPHOS 46 11/03/2011 0839   AST 19 11/03/2011 0839   ALT 20 11/03/2011 0839   BILITOT 0.6 11/03/2011 0839     bp is stable today  No cp or palpitations or headaches or edema  No side effects to medicines  BP Readings from Last 3 Encounters:  11/10/11 120/68  09/23/11 156/100  08/12/11 138/82      Partial hyst in past - uterine prolapse  Does not have gyn Is having hot flashes now - but doing ok with it    Tdap- needs today Flu-- will get this fall   mammo 4/13- normal  Self exam-- no lumps or changes   colonosc 2/11- and had polyp and hemorrhoids - re check 5 years   Lab Results  Component Value Date   CHOL 196 11/03/2011   CHOL 193 11/06/2010   Lab Results  Component Value Date   HDL 61.30 11/03/2011   HDL 78.29 11/06/2010   Lab Results  Component Value Date   LDLCALC 119* 11/03/2011   LDLCALC 111* 11/06/2010   Lab Results  Component Value Date   TRIG 79.0 11/03/2011   TRIG 101.0 11/06/2010   Lab Results  Component Value Date   CHOLHDL 3 11/03/2011   CHOLHDL 3  11/06/2010   No results found for this basename: LDLDIRECT   overall pretty good - could be better Is eating more fish and chicken lately    Patient Active Problem List  Diagnosis  . ANEMIA-NOS  . HYPERTENSION  . GERD  . DIVERTICULOSIS, COLON  . HEMATOCHEZIA  . DEPENDENT EDEMA, LEGS, BILATERAL  . COLONIC POLYPS, HX OF  . Hypokalemia  . Cough  . ETD (eustachian tube dysfunction)  . Allergic rhinitis  . OSA (obstructive sleep apnea)  . Routine general medical examination at a health care facility   Past Medical History  Diagnosis Date  . Anemia     NOS  . GERD (gastroesophageal reflux disease)   . Hypertension   . Benign neoplasm of colon   . Diverticulosis of colon (without mention of hemorrhage)   . Rash and other nonspecific skin eruption   . Edema   . Contact with or exposure to venereal diseases   . Pruritus of genital organs   . Unspecified symptom associated with female genital organs   .  Arthritis   . H/O: pneumonia    Past Surgical History  Procedure Date  . Knee arthroscopy     right  . Knee surgery 10/04  . Partial hysterectomy   . Cardiac valve surgery   . Rectal polypectomy 8/07  . Colonoscopy 7/03    polyps and tics   History  Substance Use Topics  . Smoking status: Former Smoker -- 0.8 packs/day for 30 years    Types: Cigarettes    Quit date: 03/01/2002  . Smokeless tobacco: Never Used  . Alcohol Use: Yes     1 daily   Family History  Problem Relation Age of Onset  . Colon cancer Neg Hx   . Heart disease Maternal Grandmother   . Cancer Paternal Aunt     type unknown   Allergies  Allergen Reactions  . Lisinopril     cough   Current Outpatient Prescriptions on File Prior to Visit  Medication Sig Dispense Refill  . acetaminophen (TYLENOL) 650 MG CR tablet Take 650 mg by mouth every 8 (eight) hours as needed.        Marland Kitchen albuterol (PROVENTIL HFA;VENTOLIN HFA) 108 (90 BASE) MCG/ACT inhaler Inhale 2 puffs into the lungs every 6 (six) hours  as needed for wheezing.  1 Inhaler  0  . hydrochlorothiazide 25 MG tablet Take 1 tablet (25 mg total) by mouth daily.  30 tablet  11  . mometasone (NASONEX) 50 MCG/ACT nasal spray Place 2 sprays into the nose daily.        Marland Kitchen omeprazole (PRILOSEC) 20 MG capsule Take 20 mg by mouth daily.        . potassium gluconate 595 MG TABS Take 595 mg by mouth daily.             Review of Systems Review of Systems  Constitutional: Negative for fever, appetite change, fatigue and unexpected weight change.  Eyes: Negative for pain and visual disturbance.  ENT pos for some sinus congestion  Respiratory: Negative for cough and shortness of breath.   Cardiovascular: Negative for cp or palpitations    Gastrointestinal: Negative for nausea, diarrhea and constipation.  Genitourinary: Negative for urgency and frequency.  Skin: Negative for pallor or rash   Neurological: Negative for weakness, , numbness and headaches.  Hematological: Negative for adenopathy. Does not bruise/bleed easily.  Psychiatric/Behavioral: Negative for dysphoric mood. The patient is not nervous/anxious.         Objective:   Physical Exam  Constitutional: She appears well-developed and well-nourished. No distress.       obese and well appearing   HENT:  Head: Normocephalic and atraumatic.  Mouth/Throat: Oropharynx is clear and moist.  Eyes: Conjunctivae and EOM are normal. Pupils are equal, round, and reactive to light. Right eye exhibits no discharge. Left eye exhibits no discharge.  Neck: Normal range of motion. Neck supple. No JVD present. Carotid bruit is not present. No thyromegaly present.  Cardiovascular: Normal rate, regular rhythm, normal heart sounds and intact distal pulses.  Exam reveals no gallop.   Pulmonary/Chest: Effort normal and breath sounds normal. No respiratory distress. She has no wheezes.  Abdominal: Soft. Bowel sounds are normal. She exhibits no distension, no abdominal bruit and no mass. There is no  tenderness.  Genitourinary: No breast swelling, tenderness, discharge or bleeding.       Breast exam: No mass, nodules, thickening, tenderness, bulging, retraction, inflamation, nipple discharge or skin changes noted.  No axillary or clavicular LA.  Chaperoned exam.  Musculoskeletal: Normal range of motion. She exhibits no edema and no tenderness.  Lymphadenopathy:    She has no cervical adenopathy.  Neurological: She is alert. She has normal reflexes. No cranial nerve deficit. She exhibits normal muscle tone. Coordination normal.  Skin: Skin is warm and dry. No rash noted. No erythema. No pallor.  Psychiatric: She has a normal mood and affect.          Assessment & Plan:

## 2011-11-10 NOTE — Patient Instructions (Addendum)
Tetanus shot today  Work on diet and exercise for weight loss Get enough potassium in diet

## 2011-11-11 NOTE — Assessment & Plan Note (Signed)
bp in fair control at this time  No changes needed  Disc lifstyle change with low sodium diet and exercise   Disc need for K in diet due to diuretic

## 2011-11-11 NOTE — Assessment & Plan Note (Signed)
Discussed how this problem influences overall health and the risks it imposes  Reviewed plan for weight loss with lower calorie diet (via better food choices and also portion control or program like weight watchers) and exercise building up to or more than 30 minutes 5 days per week including some aerobic activity    

## 2011-11-11 NOTE — Assessment & Plan Note (Signed)
Very mild Lab Results  Component Value Date   K 3.4* 11/03/2011   Will work in more K in diet-disc sources

## 2011-11-11 NOTE — Assessment & Plan Note (Signed)
Reviewed health habits including diet and exercise and skin cancer prevention Also reviewed health mt list, fam hx and immunizations  Rev wellness lab in detail 

## 2011-11-23 ENCOUNTER — Telehealth: Payer: Self-pay | Admitting: Family Medicine

## 2011-11-23 ENCOUNTER — Ambulatory Visit: Payer: BC Managed Care – PPO | Admitting: Family Medicine

## 2011-11-23 NOTE — Telephone Encounter (Signed)
Caller: Bonni/Patient; Patient Name: Crystal Tucker; PCP: Roxy Manns [facility]); Best Callback Phone Number: [phone].  Patient states she mowed the yard on Friday 11/19/11. She states several hours later after going to bed she began to have itching on her left foot.  She got up and it appeared to be mosquito bites or some type of reaction  over her foot.  she put alcohol,  and calamine lotion on her foot.  She states it now appears to be blistering. Red in color, water blister. +itching.  Size smaller than a pencil eraser .  She has 30-40 bites or bumps on her left foot. She has now x2 on right foot, x2 left calf and under her chin.   The foot is mildley swollen.  Treated at home clorox, epsom salt, vingear . Area appears to be worsening worsening blisters/clear fluid, warm.  Emergent s/sx ruled out per Allergic Reaction protocol with exception to "Persistant or recurrent symptoms that do not respond to treatement". See provider in 24 hours. Appointment scheduled today 11/23/11 at 4:15 with Dr. Alroy Bailiff MD for evaluation. Home care reviewed with patient. Benadryl 50mg  every 6 hours PRN itching but must have a driver. Understanding expressed. She will call back with questions,changes or concerns.

## 2011-11-23 NOTE — Telephone Encounter (Signed)
Will see then. 

## 2011-11-24 ENCOUNTER — Encounter: Payer: Self-pay | Admitting: Family Medicine

## 2011-11-24 ENCOUNTER — Ambulatory Visit (INDEPENDENT_AMBULATORY_CARE_PROVIDER_SITE_OTHER): Payer: BC Managed Care – PPO | Admitting: Family Medicine

## 2011-11-24 VITALS — BP 158/102 | HR 62 | Temp 98.4°F | Ht 67.5 in | Wt 226.8 lb

## 2011-11-24 DIAGNOSIS — L089 Local infection of the skin and subcutaneous tissue, unspecified: Secondary | ICD-10-CM

## 2011-11-24 DIAGNOSIS — R21 Rash and other nonspecific skin eruption: Secondary | ICD-10-CM

## 2011-11-24 MED ORDER — PREDNISONE 10 MG PO TABS: ORAL_TABLET | ORAL | Status: DC

## 2011-11-24 NOTE — Assessment & Plan Note (Signed)
Vesicular rash - with some pustules mainly on L ankle - but now some on other leg and chin Wound cx obt Suspect more likely allergic- poss plant pred taper 40mg - disc side eff Antihistamine prn Update if no imp

## 2011-11-24 NOTE — Patient Instructions (Addendum)
I think this is some type of allergic rash  Keep are very clean  Cool compresses may help Try zyrtec 10 mg once daily - otc  Benadryl as needed  Take the prednisone as directed I will alert you when wound culture returns Update if not starting to improve in a week or if worsening

## 2011-11-24 NOTE — Progress Notes (Signed)
Subjective:    Patient ID: Crystal Tucker, female    DOB: 02-16-1961, 51 y.o.   MRN: [id number]  HPI Started rash on 9/14 on her L ankle  It woke her up -- little blisters and bumps -- they are running Also spreading to other leg/ chin / ear   Has tried clorox/ vinigar/epsom salt/ rubbing alcohol / calamine lotion/ benadryl/ hydrocortisone cream otc  ? What she has been in  [facility] the yard Friday -- ? If could have com into contact with poison ivy   Has a dog-no fleas that she knows of - she treats the dog and the yard   Patient Active Problem List  Diagnosis  . ANEMIA-NOS  . HYPERTENSION  . GERD  . DIVERTICULOSIS, COLON  . HEMATOCHEZIA  . DEPENDENT EDEMA, LEGS, BILATERAL  . COLONIC POLYPS, HX OF  . Hypokalemia  . Cough  . ETD (eustachian tube dysfunction)  . Allergic rhinitis  . OSA (obstructive sleep apnea)  . Routine general medical examination at a health care facility  . Obesity   Past Medical History  Diagnosis Date  . Anemia     NOS  . GERD (gastroesophageal reflux disease)   . Hypertension   . Benign neoplasm of colon   . Diverticulosis of colon (without mention of hemorrhage)   . Rash and other nonspecific skin eruption   . Edema   . Contact with or exposure to venereal diseases   . Pruritus of genital organs   . Unspecified symptom associated with female genital organs   . Arthritis   . H/O: pneumonia    Past Surgical History  Procedure Date  . Knee arthroscopy     right  . Knee surgery 10/04  . Partial hysterectomy   . Cardiac valve surgery   . Rectal polypectomy 8/07  . Colonoscopy 7/03    polyps and tics   History  Substance Use Topics  . Smoking status: Former Smoker -- 0.8 packs/day for 30 years    Types: Cigarettes    Quit date: 03/01/2002  . Smokeless tobacco: Never Used  . Alcohol Use: Yes     social   Family History  Problem Relation Age of Onset  . Colon cancer Neg Hx   . Heart disease Maternal Grandmother   . Cancer  Paternal Aunt     type unknown   Allergies  Allergen Reactions  . Lisinopril     cough   Current Outpatient Prescriptions on File Prior to Visit  Medication Sig Dispense Refill  . acetaminophen (TYLENOL) 650 MG CR tablet Take 650 mg by mouth every 8 (eight) hours as needed.        Marland Kitchen albuterol (PROVENTIL HFA;VENTOLIN HFA) 108 (90 BASE) MCG/ACT inhaler Inhale 2 puffs into the lungs every 6 (six) hours as needed for wheezing.  1 Inhaler  0  . diphenhydrAMINE (BENADRYL) 50 MG capsule Take 50 mg by mouth as needed.      . hydrochlorothiazide 25 MG tablet Take 1 tablet (25 mg total) by mouth daily.  30 tablet  11  . mometasone (NASONEX) 50 MCG/ACT nasal spray Place 2 sprays into the nose daily.        Marland Kitchen omeprazole (PRILOSEC) 20 MG capsule Take 20 mg by mouth daily.        . potassium gluconate 595 MG TABS Take 595 mg by mouth daily.            Review of Systems Review of Systems  Constitutional: Negative  for fever, appetite change, fatigue and unexpected weight change.  Eyes: Negative for pain and visual disturbance.  Respiratory: Negative for cough and shortness of breath.   Cardiovascular: Negative for cp or palpitations    Gastrointestinal: Negative for nausea, diarrhea and constipation.  Genitourinary: Negative for urgency and frequency.  Skin: Negative for pallor and pos for rash and itching  Neurological: Negative for weakness, light-headedness, numbness and headaches.  Hematological: Negative for adenopathy. Does not bruise/bleed easily.  Psychiatric/Behavioral: Negative for dysphoric mood. The patient is not nervous/anxious.        Objective:   Physical Exam  Constitutional: She appears well-developed and well-nourished. No distress.  HENT:  Head: Normocephalic and atraumatic.       No mouth or throat swelling   Eyes: Conjunctivae normal and EOM are normal. Right eye exhibits no discharge. Left eye exhibits no discharge.  Neck: Normal range of motion. Neck supple. No  thyromegaly present.  Cardiovascular: Normal rate and regular rhythm.   Pulmonary/Chest: Effort normal and breath sounds normal. No respiratory distress. She has no wheezes.  Musculoskeletal: She exhibits no tenderness.  Lymphadenopathy:    She has no cervical adenopathy.  Neurological: She is alert. She has normal reflexes.  Skin: Skin is dry. Rash noted. There is erythema. No pallor.       Rash - vesicular with a few pustules, erythema and few excoriatoins Primarily on L ankle, smaller areas on arms , back of R leg   Psychiatric: She has a normal mood and affect.          Assessment & Plan:

## 2011-11-27 LAB — WOUND CULTURE
Gram Stain: NONE SEEN
Gram Stain: NONE SEEN
Organism ID, Bacteria: NO GROWTH

## 2011-12-13 ENCOUNTER — Telehealth: Payer: Self-pay | Admitting: Family Medicine

## 2011-12-13 MED ORDER — TRIAMCINOLONE ACETONIDE 0.5 % EX OINT: TOPICAL_OINTMENT | Freq: Two times a day (BID) | CUTANEOUS | Status: DC

## 2011-12-13 NOTE — Telephone Encounter (Signed)
I'm glad she is improving  We can try kenalog ointment (I pref ointment to cream in this instance)- I will send that to her pharmacy If no further improvement- please f/u

## 2011-12-13 NOTE — Telephone Encounter (Signed)
Caller: Afia/Patient; Patient Name: Crystal Tucker; PCP: Roxy Manns [facility]); Best Callback Phone Number: [phone] Rash/little bites on L foot that are severely itchy, have blistered and scabbed. She took Prednisone for 12 days  (finished one week ago) and rash and blisters have dried up but still itchy. She is wondering if she can try Kenalog Cream. She is using Hydrocortisone Cream and only helps for short time (and has tried a Publishing copy Cream and Calamine).. Triage and Care advice per Bites and Stings Protocol. She uses [facility] in [place]. Please note that she cannot take antibiotic without using Diflucan for yeast. She did have drainage at the time foot checked in the office. No drainage now, just itchy and scabbed.

## 2011-12-14 NOTE — Telephone Encounter (Signed)
Notified pt Rx was sent in to Pharm and to call to f/u if no further improvement

## 2012-01-02 ENCOUNTER — Other Ambulatory Visit: Payer: Self-pay | Admitting: Family Medicine

## 2012-10-16 ENCOUNTER — Other Ambulatory Visit: Payer: Self-pay | Admitting: Family Medicine

## 2013-01-16 ENCOUNTER — Other Ambulatory Visit: Payer: Self-pay | Admitting: Family Medicine

## 2013-01-16 MED ORDER — HYDROCHLOROTHIAZIDE 25 MG PO TABS: ORAL_TABLET | ORAL | Status: DC

## 2013-02-14 ENCOUNTER — Other Ambulatory Visit: Payer: Self-pay | Admitting: Family Medicine

## 2013-02-14 ENCOUNTER — Telehealth: Payer: Self-pay | Admitting: Family Medicine

## 2013-02-14 NOTE — Telephone Encounter (Signed)
Per Cornell Barman, RN  Patient Information: Caller Name: Betul Phone: [phone] Patient: Crystal Tucker, Crystal Tucker Gender: Female DOB: 27-Oct-1960 Age: 52 Years PCP: [facility] [employer] [facility]) Pregnant: No  Office Follow Up: Does the office need to follow up with this patient?: No Instructions For The Office: N/A  RN Note: Contacted the office and spoke with staff. Lyla Son in the office spoke with Carlena Sax referred to [facility] or see Nicki Reaper tomorrow 02/15/13 for evaluation at 11:45  Symptoms Reason For Call & Symptoms: Patient states she is having lightheaded dizzy spells.  Onset  01/31/13. Intermittent, every few days.  decreased appetite. Urinary frequency, +pressure.  Patient has multiple complaints- Reviewed Health History In EMR: Yes Reviewed Medications In EMR: Yes Reviewed Allergies In EMR: Yes Reviewed Surgeries / Procedures: Yes Date of Onset of Symptoms: 01/31/2013 OB / GYN: LMP: Unknown  Guideline(s) Used: Dizziness Urination Pain - Female  Disposition Per Guideline:   See Today in Office  Reason For Disposition Reached:   Age > 50 years  Advice Given: Fluids:   Drink extra fluids. Drink 8-10 glasses of liquids a day (Reason: to produce a dilute, non-irritating urine). Cranberry Juice:   Some people think that drinking cranberry juice may help in fighting urinary tract infections. However, there is no good research that has ever proved this. Stand Up Slowly: In the mornings, sit up for a few minutes before you stand up. That will help your blood flow make the adjustment. If you have to stand up for long periods of time, contract and relax your leg muscles to help pump the blood back to the heart. Sit down or lie down if you feel dizzy. Cool Off: If the weather is hot, apply a cold compress to the forehead or take a cool shower or bath. Drink Fluids: Drink several glasses of fruit juice, other clear fluids, or water. This will improve hydration and blood  glucose. If you have a fever or have had heat exposure, make sure the fluids are cold. Call Back If: Still feel dizzy after 2 hours of rest and fluids Passes out (faints) You become worse.  Patient Will Follow Care Advice: YES  Appointment Scheduled: 02/15/2013 11:45:00 Appointment Scheduled Provider: Nicki Reaper

## 2013-02-15 ENCOUNTER — Ambulatory Visit (INDEPENDENT_AMBULATORY_CARE_PROVIDER_SITE_OTHER): Payer: No Typology Code available for payment source | Admitting: Internal Medicine

## 2013-02-15 ENCOUNTER — Encounter: Payer: Self-pay | Admitting: Internal Medicine

## 2013-02-15 VITALS — BP 142/80 | HR 65 | Temp 98.3°F | Wt 235.0 lb

## 2013-02-15 DIAGNOSIS — R350 Frequency of micturition: Secondary | ICD-10-CM

## 2013-02-15 DIAGNOSIS — I10 Essential (primary) hypertension: Secondary | ICD-10-CM

## 2013-02-15 DIAGNOSIS — R300 Dysuria: Secondary | ICD-10-CM

## 2013-02-15 DIAGNOSIS — N898 Other specified noninflammatory disorders of vagina: Secondary | ICD-10-CM

## 2013-02-15 LAB — POCT URINALYSIS DIPSTICK
Nitrite, UA: NEGATIVE
Spec Grav, UA: 1.025
Urobilinogen, UA: NEGATIVE

## 2013-02-15 MED ORDER — ALBUTEROL SULFATE HFA 108 (90 BASE) MCG/ACT IN AERS: 2.0000 | INHALATION_SPRAY | Freq: Four times a day (QID) | RESPIRATORY_TRACT | Status: DC | PRN

## 2013-02-15 MED ORDER — MECLIZINE HCL 25 MG PO TABS: 25.0000 mg | ORAL_TABLET | Freq: Three times a day (TID) | ORAL | Status: DC | PRN

## 2013-02-15 MED ORDER — HYDROCHLOROTHIAZIDE 25 MG PO TABS: 25.0000 mg | ORAL_TABLET | Freq: Every day | ORAL | Status: DC

## 2013-02-15 NOTE — Progress Notes (Signed)
HPI  Pt presents to the clinic today with c/o urinary urgency and dysuria x 1 week. She has not noticed any blood or odor to her urine. She denies fever, chills or back pain. She has not taken anything OTC. Additionally, she also c/o a thin, white discharge x 1 week. This discharge has no odor. She thinks it is the same as her normal vaginal discharge. She is sexually active. She would not like to be tested for STD's today. She also reports that she needs a refill of her BP medication. She is out. She has a followup appt with Dr. Milinda Antis on Tuesday. She also c/o dizziness. This has occurred twice in the last 2 weeks. She reports that she gets very hot, then dizzy and has to sit down before the feelings go away. It can take 30 minutes to 6 hours to start feeling better. She denies chest pain, chest tightness or shortness of breath. She does feel a sense of imbalance and does feel like the room is spinning.   Review of Systems  Past Medical History  Diagnosis Date  . Anemia     NOS  . GERD (gastroesophageal reflux disease)   . Hypertension   . Benign neoplasm of colon   . Diverticulosis of colon (without mention of hemorrhage)   . Rash and other nonspecific skin eruption   . Edema   . Contact with or exposure to venereal diseases(V01.6)   . Pruritus of genital organs   . Unspecified symptom associated with female genital organs   . Arthritis   . H/O: pneumonia     Family History  Problem Relation Age of Onset  . Colon cancer Neg Hx   . Heart disease Maternal Grandmother   . Cancer Paternal Aunt     type unknown    History   Social History  . Marital Status: Married    Spouse Name: N/A    Number of Children: 1  . Years of Education: N/A   Occupational History  . 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 Smoking status: Former Smoker -- 0.80 packs/day for 30 years    Types: Cigarettes    Quit date: 03/01/2002  . Smokeless tobacco: Never Used  . Alcohol Use:  Yes     Comment: social  . Drug Use: No  . Sexual Activity: Not on file   Other Topics Concern  . 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 Lisinopril     cough      Constitutional: Denies fever, malaise, fatigue, headache or abrupt weight changes.  HEENT: Denies eye pain, eye redness, ear pain, ringing in the ears, wax buildup, runny nose, nasal congestion, bloody nose, or sore throat. Respiratory: Denies difficulty breathing, shortness of breath, cough or sputum production.   Cardiovascular: Denies chest pain, chest tightness, palpitations or swelling in the hands or feet.  GU: Denies urgency, burning sensation, blood in urine, odor.  Neurological: Denies  difficulty with memory, difficulty with speech or problems with balance and coordination.   No other specific complaints in a complete review of systems (except as listed in HPI above).     Objective:   Physical Exam  BP 142/80  Pulse 65  Temp(Src) 98.3 F (36.8 C) (Oral)  Wt 235 lb (106.595 kg)  SpO2 99% Wt Readings from Last 3 Encounters:  02/15/13 235 lb (106.595 kg)  11/24/11 226 lb 12 oz (102.853 kg)  11/10/11 228 lb 8 oz (103.647 kg)     General: Appears her stated age, overweight but well developed, well nourished in NAD. HEENT: Head: normal shape and size; Eyes: sclera white, no icterus, conjunctiva pink, PERRLA and EOMs intact; Ears: Tm's gray and intact, normal light reflex; Nose: mucosa pink and moist, septum midline; Throat/Mouth: Teeth present, mucosa pink and moist, no exudate, lesions or ulcerations noted.  Cardiovascular: Normal rate and rhythm. S1,S2 noted.  No murmur, rubs or gallops noted. No JVD or BLE edema. No carotid bruits noted. Pulmonary/Chest: Normal effort and positive vesicular breath sounds. No respiratory distress. No wheezes, rales or  ronchi noted.  Neurological: Alert and oriented. Cranial nerves II-XII intact. Coordination normal. +DTRs bilaterally. Negative Romberg.    BMET    Component Value Date/Time   NA 138 11/03/2011 0839   K 3.4* 11/03/2011 0839   CL 102 11/03/2011 0839   CO2 28 11/03/2011 0839   GLUCOSE 97 11/03/2011 0839   BUN 15 11/03/2011 0839   CREATININE 0.9 11/03/2011 0839   CALCIUM 9.2 11/03/2011 0839   GFRNONAA 78.23 02/04/2010 1150   GFRAA 77 03/22/2006 1700    Lipid Panel     Component Value Date/Time   CHOL 196 11/03/2011 0839   TRIG 79.0 11/03/2011 0839   HDL 61.30 11/03/2011 0839   CHOLHDL 3 11/03/2011 0839   VLDL 15.8 11/03/2011 0839   LDLCALC 119* 11/03/2011 0839    CBC    Component Value Date/Time   WBC 5.2 11/03/2011 0839   RBC 4.51 11/03/2011 0839   HGB 12.3 11/03/2011 0839   HCT 37.9 11/03/2011 0839   PLT 227.0 11/03/2011 0839   MCV 84.2 11/03/2011 0839   MCHC 32.3 11/03/2011 0839   RDW 13.7 11/03/2011 0839   LYMPHSABS 2.7 11/03/2011 0839   MONOABS 0.3 11/03/2011 0839   EOSABS 0.1 11/03/2011 0839   BASOSABS 0.0 11/03/2011 0839    Hgb A1C No results found for this basename: HGBA1C         Assessment & Plan:   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 Milinda Antis on Tuesday  RTC as needed or if symptoms persist.

## 2013-02-15 NOTE — Assessment & Plan Note (Signed)
Elevated today but she has been out of her meds x 2 days She does have an appt on Tuesday to followup with Dr. Milinda Antis We will give her a 30 day supply of HCTZ until that time

## 2013-02-15 NOTE — Telephone Encounter (Signed)
This pt has 1145 appt today and is wanting refill

## 2013-02-15 NOTE — Patient Instructions (Signed)
Vertigo Vertigo means you feel like you or your surroundings are moving when they are not. Vertigo can be dangerous if it occurs when you are at work, driving, or performing difficult activities.  CAUSES  Vertigo occurs when there is a conflict of signals sent to your brain from the visual and sensory systems in your body. There are many different causes of vertigo, including:  Infections, especially in the inner ear.  A bad reaction to a drug or misuse of alcohol and medicines.  Withdrawal from drugs or alcohol.  Rapidly changing positions, such as lying down or rolling over in bed.  A migraine headache.  Decreased blood flow to the brain.  Increased pressure in the brain from a head injury, infection, tumor, or bleeding. SYMPTOMS  You may feel as though the world is spinning around or you are falling to the ground. Because your balance is upset, vertigo can cause nausea and vomiting. You may have involuntary eye movements (nystagmus). DIAGNOSIS  Vertigo is usually diagnosed by physical exam. If the cause of your vertigo is unknown, your caregiver may perform imaging tests, such as an MRI scan (magnetic resonance imaging). TREATMENT  Most cases of vertigo resolve on their own, without treatment. Depending on the cause, your caregiver may prescribe certain medicines. If your vertigo is related to body position issues, your caregiver may recommend movements or procedures to correct the problem. In rare cases, if your vertigo is caused by certain inner ear problems, you may need surgery. HOME CARE INSTRUCTIONS   Follow your caregiver's instructions.  Avoid driving.  Avoid operating heavy machinery.  Avoid performing any tasks that would be dangerous to you or others during a vertigo episode.  Tell your caregiver if you notice that certain medicines seem to be causing your vertigo. Some of the medicines used to treat vertigo episodes can actually make them worse in some people. SEEK  IMMEDIATE MEDICAL CARE IF:   Your medicines do not relieve your vertigo or are making it worse.  You develop problems with talking, walking, weakness, or using your arms, hands, or legs.  You develop severe headaches.  Your nausea or vomiting continues or gets worse.  You develop visual changes.  A family member notices behavioral changes.  Your condition gets worse. MAKE SURE YOU:  Understand these instructions.  Will watch your condition.  Will get help right away if you are not doing well or get worse. Document Released: 12/02/2004 Document Revised: 05/17/2011 Document Reviewed: 09/10/2010 ExitCare Patient Information 2014 [employer].  

## 2013-02-15 NOTE — Progress Notes (Signed)
Pre-visit discussion using our clinic review tool. No additional management support is needed unless otherwise documented below in the visit note.  

## 2013-02-20 ENCOUNTER — Ambulatory Visit (INDEPENDENT_AMBULATORY_CARE_PROVIDER_SITE_OTHER): Payer: No Typology Code available for payment source | Admitting: Family Medicine

## 2013-02-20 ENCOUNTER — Encounter: Payer: Self-pay | Admitting: Family Medicine

## 2013-02-20 VITALS — BP 140/86 | HR 69 | Temp 97.9°F | Ht 67.75 in | Wt 237.5 lb

## 2013-02-20 DIAGNOSIS — H8110 Benign paroxysmal vertigo, unspecified ear: Secondary | ICD-10-CM | POA: Insufficient documentation

## 2013-02-20 DIAGNOSIS — E669 Obesity, unspecified: Secondary | ICD-10-CM

## 2013-02-20 DIAGNOSIS — I10 Essential (primary) hypertension: Secondary | ICD-10-CM

## 2013-02-20 DIAGNOSIS — J019 Acute sinusitis, unspecified: Secondary | ICD-10-CM | POA: Insufficient documentation

## 2013-02-20 DIAGNOSIS — E876 Hypokalemia: Secondary | ICD-10-CM

## 2013-02-20 DIAGNOSIS — Z23 Encounter for immunization: Secondary | ICD-10-CM

## 2013-02-20 LAB — CBC WITH DIFFERENTIAL/PLATELET
Basophils Absolute: 0.1 10*3/uL (ref 0.0–0.1)
Eosinophils Absolute: 0.3 10*3/uL (ref 0.0–0.7)
Eosinophils Relative: 4.6 % (ref 0.0–5.0)
Hemoglobin: 12.1 g/dL (ref 12.0–15.0)
Lymphocytes Relative: 58 % — ABNORMAL HIGH (ref 12.0–46.0)
MCHC: 32.5 g/dL (ref 30.0–36.0)
Monocytes Relative: 4.3 % (ref 3.0–12.0)
Neutro Abs: 2.2 10*3/uL (ref 1.4–7.7)
RBC: 4.49 Mil/uL (ref 3.87–5.11)
RDW: 13.8 % (ref 11.5–14.6)
WBC: 6.7 10*3/uL (ref 4.5–10.5)

## 2013-02-20 LAB — COMPREHENSIVE METABOLIC PANEL
ALT: 21 U/L (ref 0–35)
CO2: 31 mEq/L (ref 19–32)
Calcium: 9.1 mg/dL (ref 8.4–10.5)
Chloride: 101 mEq/L (ref 96–112)
Creatinine, Ser: 0.8 mg/dL (ref 0.4–1.2)
GFR: 97.95 mL/min (ref >60.00)
Potassium: 3.1 mEq/L — ABNORMAL LOW (ref 3.5–5.1)
Total Protein: 6.6 g/dL (ref 6.0–8.3)

## 2013-02-20 LAB — LIPID PANEL
LDL Cholesterol: 130 mg/dL — ABNORMAL HIGH (ref 0–99)
Total CHOL/HDL Ratio: 4
VLDL: 19 mg/dL (ref 0.0–40.0)

## 2013-02-20 MED ORDER — AMLODIPINE BESYLATE 2.5 MG PO TABS: 2.5000 mg | ORAL_TABLET | Freq: Every day | ORAL | Status: DC

## 2013-02-20 MED ORDER — AMOXICILLIN-POT CLAVULANATE 875-125 MG PO TABS: 1.0000 | ORAL_TABLET | Freq: Two times a day (BID) | ORAL | Status: DC

## 2013-02-20 NOTE — Progress Notes (Signed)
Pre-visit discussion using our clinic review tool. No additional management support is needed unless otherwise documented below in the visit note.  

## 2013-02-20 NOTE — Patient Instructions (Addendum)
Call your insurance co. And find out if they pay for screening (mri/ mra) for cerebral aneurysm in family Find out what kind of cancers are in your family as well  Lab today Take the augmentin for sinus infection  Also add amlodipine to get blood pressure a little better  Follow up with me in 2-3 weeks Keep taking care of yourself  Flu vaccine today

## 2013-02-20 NOTE — Progress Notes (Signed)
Subjective:    Patient ID: Crystal Tucker, female    DOB: 01/10/1961, 52 y.o.   MRN: [id number]  HPI Here for HTN and also dizzy spells   When she gets dizzy spells - the room spins and she feels nauseated Going on since thanksgiving  She takes meclizine for it - cannot tell if it is really working  She thinks she has a sinus problem- drainage/ painful sinuses around ethmoid area  No fever   Has had a nagging headache - top of her head/ dull and not severe  Not throbbing    BP Readings from Last 3 Encounters:  02/20/13 140/86  02/15/13 142/80  11/24/11 158/102  other than HA no other symptoms  She takes hctz  Also hx of low K - takes that otc    She is overdue for lab and f/u   Stress may play a role Menopause may play a role also- a lot of hot flashes   Patient Active Problem List   Diagnosis Date Noted  . Obesity 11/10/2011  . Routine general medical examination at a health care facility 11/01/2011  . Allergic rhinitis 07/16/2011  . OSA (obstructive sleep apnea) 07/16/2011  . Hypokalemia 12/15/2010  . DEPENDENT EDEMA, LEGS, BILATERAL 10/11/2008  . ANEMIA-NOS 10/26/2006  . HYPERTENSION 10/26/2006  . GERD 10/26/2006  . DIVERTICULOSIS, COLON 10/26/2006  . COLONIC POLYPS, HX OF 10/26/2006   Past Medical History  Diagnosis Date  . Anemia     NOS  . GERD (gastroesophageal reflux disease)   . Hypertension   . Benign neoplasm of colon   . Diverticulosis of colon (without mention of hemorrhage)   . Rash and other nonspecific skin eruption   . Edema   . Contact with or exposure to venereal diseases(V01.6)   . Pruritus of genital organs   . Unspecified symptom associated with female genital organs   . Arthritis   . H/O: pneumonia    Past Surgical History  Procedure Laterality Date  . Knee arthroscopy      right  . Knee surgery  10/04  . Partial hysterectomy    . Cardiac valve surgery    . Rectal polypectomy  8/07  . Colonoscopy  7/03    polyps and tics     History  Substance Use Topics  . Smoking status: Former Smoker -- 0.80 packs/day for 30 years    Types: Cigarettes    Quit date: 03/01/2002  . Smokeless tobacco: Never Used  . Alcohol Use: Yes     Comment: social   Family History  Problem Relation Age of Onset  . Colon cancer Neg Hx   . Heart disease Maternal Grandmother   . Cancer Paternal Aunt     type unknown   Allergies  Allergen Reactions  . Lisinopril     cough   Current Outpatient Prescriptions on File Prior to Visit  Medication Sig Dispense Refill  . acetaminophen (TYLENOL) 650 MG CR tablet Take 650 mg by mouth every 8 (eight) hours as needed.        Marland Kitchen albuterol (PROVENTIL HFA;VENTOLIN HFA) 108 (90 BASE) MCG/ACT inhaler Inhale 2 puffs into the lungs every 6 (six) hours as needed for wheezing.  1 Inhaler  0  . albuterol (PROVENTIL HFA;VENTOLIN HFA) 108 (90 BASE) MCG/ACT inhaler Inhale 2 puffs into the lungs every 6 (six) hours as needed for wheezing.      . diphenhydrAMINE (BENADRYL) 50 MG capsule Take 50 mg by mouth as needed.      Marland Kitchen  hydrochlorothiazide (HYDRODIURIL) 25 MG tablet Take 1 tablet (25 mg total) by mouth daily.  30 tablet  3  . meclizine (ANTIVERT) 25 MG tablet Take 1 tablet (25 mg total) by mouth 3 (three) times daily as needed for dizziness.  30 tablet  0  . mometasone (NASONEX) 50 MCG/ACT nasal spray Place 2 sprays into the nose daily.        Marland Kitchen omeprazole (PRILOSEC) 20 MG capsule Take 20 mg by mouth daily.        . potassium gluconate 595 MG TABS Take 595 mg by mouth daily.        Marland Kitchen triamcinolone ointment (KENALOG) 0.5 % Apply topically 2 (two) times daily. To affected area  30 g  0   No current facility-administered medications on file prior to visit.     Review of Systems Review of Systems  Constitutional: Negative for fever, appetite change, fatigue and unexpected weight change.  Eyes: Negative for pain and visual disturbance.  ENT pos for congestion/ ear fullness/ sinus pain  Respiratory:  Negative for wheeze  and shortness of breath.   Cardiovascular: Negative for cp or palpitations    Gastrointestinal: Negative for nausea, diarrhea and constipation.  Genitourinary: Negative for urgency and frequency.  Skin: Negative for pallor or rash   Neurological: Negative for weakness,  numbness and pos for  headaches.  Hematological: Negative for adenopathy. Does not bruise/bleed easily.  Psychiatric/Behavioral: Negative for dysphoric mood. The patient is not nervous/anxious.         Objective:   Physical Exam  Constitutional: She appears well-developed and well-nourished. No distress.  obese and well appearing   HENT:  Head: Normocephalic and atraumatic.  Right Ear: External ear normal.  Mouth/Throat: Oropharynx is clear and moist. No oropharyngeal exudate.  Nares are injected and congested  bilat ethmoid and maxillary sinus tenderness Yellow nasal drainage   Eyes: Conjunctivae and EOM are normal. Pupils are equal, round, and reactive to light. Right eye exhibits no discharge. Left eye exhibits no discharge. No scleral icterus.  2-3 beats of horizontal nystagmus bilat   Neck: Normal range of motion. Neck supple. No JVD present. Carotid bruit is not present. No thyromegaly present.  Cardiovascular: Normal rate, regular rhythm and intact distal pulses.  Exam reveals no gallop.   Pulmonary/Chest: Effort normal and breath sounds normal. No respiratory distress. She has no wheezes.  Abdominal: Soft. Bowel sounds are normal. She exhibits no distension and no mass. There is no tenderness.  Musculoskeletal: She exhibits no edema.  Lymphadenopathy:    She has no cervical adenopathy.  Neurological: She is alert. She has normal reflexes. She displays no atrophy. No cranial nerve deficit or sensory deficit. She exhibits normal muscle tone. Coordination normal.  No focal cerebellar signs   Skin: Skin is warm and dry. No rash noted.  Psychiatric: She has a normal mood and affect.           Assessment & Plan:

## 2013-02-21 ENCOUNTER — Telehealth: Payer: Self-pay | Admitting: Family Medicine

## 2013-02-21 MED ORDER — POTASSIUM CHLORIDE CRYS ER 20 MEQ PO TBCR: 20.0000 meq | EXTENDED_RELEASE_TABLET | Freq: Once | ORAL | Status: DC

## 2013-02-21 NOTE — Assessment & Plan Note (Signed)
This may be related to sinus symptoms/ ha Non focal exam  Meclizine prn  F/u 2-3 wk - hope for resolution of sinusitis and better bp control

## 2013-02-21 NOTE — Assessment & Plan Note (Signed)
Lab today.

## 2013-02-21 NOTE — Telephone Encounter (Signed)
Rx sent to pharmacy and pt notified of Dr. Royden Purl comments/recommendations regarding her labs, pt verbalized understanding

## 2013-02-21 NOTE — Assessment & Plan Note (Signed)
Cover with augmentin  Disc symptomatic care - see instructions on AVS  Update if not starting to improve in a week or if worsening   

## 2013-02-21 NOTE — Telephone Encounter (Signed)
K is low despite her current dose  Please call in Kdur 20 meq - she can stop what she is already taking  Chol is up a bit  Other labs ok  We will disc in detail at her f/u and re check the K

## 2013-02-21 NOTE — Assessment & Plan Note (Signed)
BP Readings from Last 3 Encounters:  02/20/13 140/86  02/15/13 142/80  11/24/11 158/102  with headache Will add low dose amlodipine Continue hctz  Annual lab today F/u 2-3 wk Disc lifestyle change and DASH diet

## 2013-02-21 NOTE — Assessment & Plan Note (Signed)
Discussed how this problem influences overall health and the risks it imposes  Reviewed plan for weight loss with lower calorie diet (via better food choices and also portion control or program like weight watchers) and exercise building up to or more than 30 minutes 5 days per week including some aerobic activity    

## 2013-02-23 ENCOUNTER — Telehealth: Payer: Self-pay

## 2013-02-23 MED ORDER — FLUCONAZOLE 150 MG PO TABS: 150.0000 mg | ORAL_TABLET | Freq: Once | ORAL | Status: DC

## 2013-02-23 NOTE — Telephone Encounter (Signed)
Pt left /vm was seen on 02/20/13 and given an antibiotic, pt forgot to ask for med for yeast infection at that visit. Pt has started with vaginal iltching and request med sent to [facility].Please advise.

## 2013-02-23 NOTE — Telephone Encounter (Signed)
Diflucan sent

## 2013-02-26 NOTE — Telephone Encounter (Signed)
Pt.notified

## 2013-03-20 ENCOUNTER — Encounter: Payer: Self-pay | Admitting: Family Medicine

## 2013-03-20 ENCOUNTER — Ambulatory Visit (INDEPENDENT_AMBULATORY_CARE_PROVIDER_SITE_OTHER): Payer: No Typology Code available for payment source | Admitting: Family Medicine

## 2013-03-20 VITALS — BP 142/84 | HR 71 | Temp 98.6°F | Ht 67.75 in | Wt 235.5 lb

## 2013-03-20 DIAGNOSIS — H8110 Benign paroxysmal vertigo, unspecified ear: Secondary | ICD-10-CM

## 2013-03-20 DIAGNOSIS — I10 Essential (primary) hypertension: Secondary | ICD-10-CM

## 2013-03-20 DIAGNOSIS — E876 Hypokalemia: Secondary | ICD-10-CM

## 2013-03-20 DIAGNOSIS — J019 Acute sinusitis, unspecified: Secondary | ICD-10-CM

## 2013-03-20 MED ORDER — AMLODIPINE BESYLATE 5 MG PO TABS
5.0000 mg | ORAL_TABLET | Freq: Every day | ORAL | Status: DC
Start: 2013-03-20 — End: 2014-04-06

## 2013-03-20 NOTE — Patient Instructions (Signed)
Increase amlodipine for blood pressure to 5 mg once daily    (the pills you have left are 2.5 mg so take 2 pills once daily)- the new px is for 5 mg -hold on to that  Update me next week with how blood pressure is doing  Stop up front for referral for CT of the sinuses - we will call you with a result   Work on healthy lifestyle and weight loss Labs for potassium level today   Follow up with me in about 4-6 weeks

## 2013-03-20 NOTE — Progress Notes (Signed)
Pre-visit discussion using our clinic review tool. No additional management support is needed unless otherwise documented below in the visit note.  

## 2013-03-20 NOTE — Progress Notes (Signed)
Subjective:    Patient ID: Crystal Tucker, female    DOB: 05/31/1960, 53 y.o.   MRN: [id number]  HPI Here for f/u of HTN and other problems   Dizziness is improved but not totally gone yet  Sinus symptoms (s/p tx for sinusitis) - is so/ so - post nasal drip now  Still has facial pain and tenderness Still a bit dizzy   Low K -went up on dose    Chemistry      Component Value Date/Time   NA 138 02/20/2013 1501   K 3.1* 02/20/2013 1501   CL 101 02/20/2013 1501   CO2 31 02/20/2013 1501   BUN 12 02/20/2013 1501   CREATININE 0.8 02/20/2013 1501      Component Value Date/Time   CALCIUM 9.1 02/20/2013 1501   ALKPHOS 51 02/20/2013 1501   AST 19 02/20/2013 1501   ALT 21 02/20/2013 1501   BILITOT 0.4 02/20/2013 1501    She did have a little cramping in feet and hands - better now  Will re check this     Added amlodipine bp no change - here or at home on 2.5 mg  BP Readings from Last 3 Encounters:  03/20/13 142/84  02/20/13 140/86  02/15/13 142/80     Wt is down 2 lb with bmi of 36- is working on that - avoiding starchy foods and breads She has a hard time avoiding sugar   Lipids: Lab Results  Component Value Date   CHOL 200 02/20/2013   HDL 50.90 02/20/2013   LDLCALC 130* 02/20/2013   TRIG 95.0 02/20/2013   CHOLHDL 4 02/20/2013   disc goal of lower LDL and diet     Review of Systems Review of Systems  Constitutional: Negative for fever, appetite change, fatigue and unexpected weight change.  ENT pos for congestion and facial pain  Eyes: Negative for pain and visual disturbance.  Respiratory: Negative for wheeze  and shortness of breath.   Cardiovascular: Negative for cp or palpitations    Gastrointestinal: Negative for nausea, diarrhea and constipation.  Genitourinary: Negative for urgency and frequency.  Skin: Negative for pallor or rash   Neurological: Negative for weakness, , numbness and headaches.  Hematological: Negative for adenopathy. Does not  bruise/bleed easily.  Psychiatric/Behavioral: Negative for dysphoric mood. The patient is not nervous/anxious.         Objective:   Physical Exam  Constitutional: She appears well-developed and well-nourished. No distress.  obese and well appearing   HENT:  Head: Normocephalic and atraumatic.  Right Ear: External ear normal.  Left Ear: External ear normal.  Mouth/Throat: Oropharynx is clear and moist. No oropharyngeal exudate.  Nares are injected and congested  Tender frontal and maxillary sinuses  Throat clear   Eyes: Conjunctivae and EOM are normal. Pupils are equal, round, and reactive to light. Right eye exhibits no discharge. Left eye exhibits no discharge. No scleral icterus.  Neck: Normal range of motion. Neck supple. Carotid bruit is not present.  Cardiovascular: Normal rate and regular rhythm.   Pulmonary/Chest: Effort normal and breath sounds normal. No respiratory distress. She has no wheezes. She has no rales.  Musculoskeletal: She exhibits no edema.  Lymphadenopathy:    She has no cervical adenopathy.  Neurological: She is alert. She has normal reflexes. No cranial nerve deficit. She exhibits normal muscle tone. Coordination normal.  Skin: Skin is warm and dry. No rash noted. No erythema.  Psychiatric: She has a normal mood and affect.  Assessment & Plan:

## 2013-03-21 ENCOUNTER — Other Ambulatory Visit: Payer: Self-pay | Admitting: Family Medicine

## 2013-03-21 DIAGNOSIS — Z1231 Encounter for screening mammogram for malignant neoplasm of breast: Secondary | ICD-10-CM

## 2013-03-21 LAB — POTASSIUM: Potassium: 3.4 mEq/L — ABNORMAL LOW (ref 3.5–5.1)

## 2013-03-21 NOTE — Assessment & Plan Note (Signed)
This is slt imp ? Rel to sinus prob CT sinuses ordered

## 2013-03-21 NOTE — Assessment & Plan Note (Signed)
Re check today on inc dose

## 2013-03-21 NOTE — Assessment & Plan Note (Signed)
bp not improved with amlodipine 2.5 Will inc to 5 - check here and at home  DASH diet/ exercise / wt loss adv  F/u planned

## 2013-03-21 NOTE — Assessment & Plan Note (Signed)
Not improving after augmentin  CT sinuses planned

## 2013-03-22 ENCOUNTER — Encounter: Payer: Self-pay | Admitting: *Deleted

## 2013-03-29 ENCOUNTER — Ambulatory Visit (INDEPENDENT_AMBULATORY_CARE_PROVIDER_SITE_OTHER)
Admission: RE | Admit: 2013-03-29 | Discharge: 2013-03-29 | Disposition: A | Discharge status: Home / Self Care | Payer: No Typology Code available for payment source | Source: Ambulatory Visit | Attending: Family Medicine | Admitting: Family Medicine

## 2013-03-29 ENCOUNTER — Ambulatory Visit (HOSPITAL_COMMUNITY)
Admission: RE | Admit: 2013-03-29 | Discharge: 2013-03-29 | Disposition: A | Discharge status: Home / Self Care | Payer: No Typology Code available for payment source | Source: Ambulatory Visit | Attending: Family Medicine | Admitting: Family Medicine

## 2013-03-29 DIAGNOSIS — J019 Acute sinusitis, unspecified: Secondary | ICD-10-CM

## 2013-03-29 DIAGNOSIS — Z1231 Encounter for screening mammogram for malignant neoplasm of breast: Secondary | ICD-10-CM | POA: Insufficient documentation

## 2013-03-30 ENCOUNTER — Telehealth: Payer: Self-pay

## 2013-03-30 ENCOUNTER — Telehealth: Payer: Self-pay | Admitting: Family Medicine

## 2013-03-30 DIAGNOSIS — J019 Acute sinusitis, unspecified: Secondary | ICD-10-CM

## 2013-03-30 DIAGNOSIS — J3489 Other specified disorders of nose and nasal sinuses: Secondary | ICD-10-CM | POA: Insufficient documentation

## 2013-03-30 LAB — HM MAMMOGRAPHY: HM Mammogram: NORMAL

## 2013-03-30 MED ORDER — PREDNISONE 10 MG PO TABS: ORAL_TABLET | ORAL | Status: DC

## 2013-03-30 NOTE — Telephone Encounter (Signed)
Pt notified Dr. Glori Bickers reviewed her BP readings. Comments of CT are in results notes

## 2013-03-30 NOTE — Telephone Encounter (Signed)
Pt left v/m wanting Dr Glori Bickers to know BP was 132/89 on 03/28/13. Today BP is 120/85. Pt is feeling good; pt is taking BP med as prescribed (pt cannot remember names of med), pt request cb and would also like CT of sinus result.

## 2013-03-30 NOTE — Telephone Encounter (Signed)
Message copied by Abner Greenspan on Fri Mar 30, 2013  4:53 PM ------      Message from: Earleen Reaper M      Created: Fri Mar 30, 2013  4:20 PM       Pt advised of CT results. Pt said her sxs are the same and haven't improved at all. Pt still has congestion/drainage, and sinus pressure, please advise ------

## 2013-03-30 NOTE — Telephone Encounter (Signed)
Pt notified Dr. Glori Bickers referred her to ENT, also advise to use aleve bid prn, nasal saline, and nasonex, and also prednisone sent to pharmacy

## 2013-03-30 NOTE — Telephone Encounter (Signed)
I'm glad bp looks good  My comments are attached to the CT - how are her sinus

## 2013-03-30 NOTE — Telephone Encounter (Signed)
If she can take nsaid without stomach upset - aleve 1-2 pills with food bid may help pressure and pain  Use nasal saline as needed  Make sure to use nasonex daily  I am going to refer her to ENT for further eval  Please call in prednisone taper (this may make her hyper and hungry-common side effects )

## 2013-04-03 ENCOUNTER — Encounter: Payer: Self-pay | Admitting: *Deleted

## 2013-04-03 ENCOUNTER — Encounter: Payer: Self-pay | Admitting: Family Medicine

## 2013-04-11 ENCOUNTER — Telehealth: Payer: Self-pay | Admitting: Family Medicine

## 2013-04-11 NOTE — Telephone Encounter (Signed)
Relevant patient education assigned to patient using Emmi. ° °

## 2013-04-23 ENCOUNTER — Ambulatory Visit: Payer: No Typology Code available for payment source | Admitting: Family Medicine

## 2013-06-11 ENCOUNTER — Other Ambulatory Visit: Payer: Self-pay | Admitting: Internal Medicine

## 2013-07-10 ENCOUNTER — Other Ambulatory Visit: Payer: Self-pay | Admitting: Family Medicine

## 2013-09-14 ENCOUNTER — Other Ambulatory Visit: Payer: Self-pay | Admitting: Family Medicine

## 2014-01-10 ENCOUNTER — Other Ambulatory Visit: Payer: Self-pay | Admitting: Family Medicine

## 2014-01-10 NOTE — Telephone Encounter (Signed)
Please refill for 3 months  

## 2014-01-10 NOTE — Telephone Encounter (Signed)
Fax refill request, no recent/future appt., please advise  

## 2014-01-11 MED ORDER — POTASSIUM CHLORIDE CRYS ER 20 MEQ PO TBCR: EXTENDED_RELEASE_TABLET | ORAL | Status: DC

## 2014-01-11 MED ORDER — HYDROCHLOROTHIAZIDE 25 MG PO TABS: ORAL_TABLET | ORAL | Status: DC

## 2014-01-11 NOTE — Telephone Encounter (Signed)
done

## 2014-04-06 ENCOUNTER — Other Ambulatory Visit: Payer: Self-pay | Admitting: Family Medicine

## 2014-05-09 ENCOUNTER — Telehealth: Payer: Self-pay | Admitting: Family Medicine

## 2014-05-09 NOTE — Telephone Encounter (Signed)
Pt will need to have a from filled out for her Insurance and will need a CPE visit by 08/06/14. She is requesting to be seen sometime between 07/07/14 and 08/06/14 to have this completed. There are not CPE slots open for May. Please advise

## 2014-05-09 NOTE — Telephone Encounter (Signed)
Please schedule in any 30 min slot

## 2014-05-16 ENCOUNTER — Other Ambulatory Visit: Payer: Self-pay

## 2014-05-16 NOTE — Telephone Encounter (Signed)
Please refill to get by until visit Thanks

## 2014-05-16 NOTE — Telephone Encounter (Signed)
Pt left v/m requesting refill HCTZ to [facility] [address]. Pt last seen 03/20/2013 and has appt for CPX on 07/16/14. Is it OK to refill? Pt out of med 1 week.

## 2014-05-17 ENCOUNTER — Other Ambulatory Visit: Payer: Self-pay | Admitting: Family Medicine

## 2014-05-17 MED ORDER — HYDROCHLOROTHIAZIDE 25 MG PO TABS: ORAL_TABLET | ORAL | Status: DC

## 2014-05-20 ENCOUNTER — Other Ambulatory Visit: Payer: Self-pay

## 2014-05-20 MED ORDER — POTASSIUM CHLORIDE CRYS ER 20 MEQ PO TBCR: EXTENDED_RELEASE_TABLET | ORAL | Status: DC

## 2014-07-10 ENCOUNTER — Telehealth: Payer: Self-pay | Admitting: Family Medicine

## 2014-07-10 DIAGNOSIS — Z0000 Encounter for general adult medical examination without abnormal findings: Secondary | ICD-10-CM

## 2014-07-10 NOTE — Telephone Encounter (Signed)
-----   Message from Ellamae Sia sent at 07/05/2014  2:59 PM EDT ----- Regarding: Lab orders for Thursday, 5.5.16 Patient is scheduled for CPX labs, please order future labs, Thanks , Karna Christmas

## 2014-07-11 ENCOUNTER — Other Ambulatory Visit (INDEPENDENT_AMBULATORY_CARE_PROVIDER_SITE_OTHER): Payer: Commercial Managed Care - PPO

## 2014-07-11 DIAGNOSIS — Z0000 Encounter for general adult medical examination without abnormal findings: Secondary | ICD-10-CM

## 2014-07-11 LAB — CBC WITH DIFFERENTIAL/PLATELET
Basophils Absolute: 0 10*3/uL (ref 0.0–0.1)
Basophils Relative: 1 % (ref 0.0–3.0)
EOS ABS: 0.1 10*3/uL (ref 0.0–0.7)
Eosinophils Relative: 2.2 % (ref 0.0–5.0)
HCT: 37.6 % (ref 36.0–46.0)
Hemoglobin: 12.4 g/dL (ref 12.0–15.0)
LYMPHS ABS: 2.5 10*3/uL (ref 0.7–4.0)
LYMPHS PCT: 52.2 % — AB (ref 12.0–46.0)
MCHC: 33 g/dL (ref 30.0–36.0)
MCV: 81.4 fl (ref 78.0–100.0)
MONOS PCT: 7 % (ref 3.0–12.0)
Monocytes Absolute: 0.3 10*3/uL (ref 0.1–1.0)
Neutro Abs: 1.8 10*3/uL (ref 1.4–7.7)
Neutrophils Relative %: 37.6 % — ABNORMAL LOW (ref 43.0–77.0)
PLATELETS: 313 10*3/uL (ref 150.0–400.0)
RBC: 4.62 Mil/uL (ref 3.87–5.11)
RDW: 13.7 % (ref 11.5–15.5)
WBC: 4.8 10*3/uL (ref 4.0–10.5)

## 2014-07-11 LAB — LIPID PANEL
CHOLESTEROL: 205 mg/dL — AB (ref 0–200)
HDL: 60.9 mg/dL (ref >39.00)
LDL Cholesterol: 132 mg/dL — ABNORMAL HIGH (ref 0–99)
NONHDL: 144.1
Total CHOL/HDL Ratio: 3
Triglycerides: 63 mg/dL (ref 0.0–149.0)
VLDL: 12.6 mg/dL (ref 0.0–40.0)

## 2014-07-11 LAB — COMPREHENSIVE METABOLIC PANEL
ALK PHOS: 50 U/L (ref 39–117)
ALT: 14 U/L (ref 0–35)
AST: 16 U/L (ref 0–37)
Albumin: 3.8 g/dL (ref 3.5–5.2)
BILIRUBIN TOTAL: 0.5 mg/dL (ref 0.2–1.2)
BUN: 12 mg/dL (ref 6–23)
CHLORIDE: 104 meq/L (ref 96–112)
CO2: 29 mEq/L (ref 19–32)
CREATININE: 0.72 mg/dL (ref 0.40–1.20)
Calcium: 9.3 mg/dL (ref 8.4–10.5)
GFR: 108.45 mL/min (ref >60.00)
Glucose, Bld: 103 mg/dL — ABNORMAL HIGH (ref 70–99)
Potassium: 3.7 mEq/L (ref 3.5–5.1)
Sodium: 139 mEq/L (ref 135–145)
Total Protein: 6.6 g/dL (ref 6.0–8.3)

## 2014-07-11 LAB — TSH: TSH: 0.81 u[IU]/mL (ref 0.35–4.50)

## 2014-07-16 ENCOUNTER — Encounter: Payer: Self-pay | Admitting: Family Medicine

## 2014-07-16 ENCOUNTER — Ambulatory Visit (INDEPENDENT_AMBULATORY_CARE_PROVIDER_SITE_OTHER): Payer: Commercial Managed Care - PPO | Admitting: Family Medicine

## 2014-07-16 VITALS — BP 135/80 | HR 57 | Temp 98.2°F | Ht 67.5 in | Wt 214.8 lb

## 2014-07-16 DIAGNOSIS — E669 Obesity, unspecified: Secondary | ICD-10-CM | POA: Diagnosis not present

## 2014-07-16 DIAGNOSIS — R102 Pelvic and perineal pain: Secondary | ICD-10-CM | POA: Insufficient documentation

## 2014-07-16 DIAGNOSIS — Z0000 Encounter for general adult medical examination without abnormal findings: Secondary | ICD-10-CM | POA: Diagnosis not present

## 2014-07-16 DIAGNOSIS — I10 Essential (primary) hypertension: Secondary | ICD-10-CM | POA: Diagnosis not present

## 2014-07-16 MED ORDER — ALBUTEROL SULFATE HFA 108 (90 BASE) MCG/ACT IN AERS: 2.0000 | INHALATION_SPRAY | Freq: Four times a day (QID) | RESPIRATORY_TRACT | Status: DC | PRN

## 2014-07-16 MED ORDER — AMOXICILLIN 500 MG PO TABS: ORAL_TABLET | ORAL | Status: DC

## 2014-07-16 NOTE — Patient Instructions (Addendum)
Stop at check out for referral for pelvic ultrasound  Don't forget to schedule mammogram Labs are stable Keep loosing weight   I will send your dental antibiotic to the pharmacy later today

## 2014-07-16 NOTE — Assessment & Plan Note (Signed)
Great job with wt loss effort so far- commended  Plans to begin more regular exercise  Discussed how this problem influences overall health and the risks it imposes  Reviewed plan for weight loss with lower calorie diet (via better food choices and also portion control or program like weight watchers) and exercise building up to or more than 30 minutes 5 days per week including some aerobic activity

## 2014-07-16 NOTE — Progress Notes (Signed)
Subjective:    Patient ID: Crystal Tucker, female    DOB: [birth year], 54 y.o.   MRN: [id number]  HPI Here for health maintenance exam and to review chronic medical problems    Wt is down 21 lb with bmi of 33   (by her scales gained before she lost -so lost more)  Intentional - and doing well - she is eating light /following a diet  Eating eggs and salads and some meat at night  Avoids fried food and has limited snacks (occ chocolate) and has cut out sodas (drinking lots of water)  A little bit of exercise -wants to do more =that is her next goal   Her husband left her - it was difficult on her , getting easier and ready to move on  Know she deserves better  She has good support and that has been helpful    Hep C/HIV screen- she is not worried about that at this time  Getting hep B series at work   Has had a partial hysterectomy-still has ovaries  No gyn problems - but occ has some pain in R lower abdomen - comes and goes - does not seem to correspond with bms    (she takes fiber gummy for her constipation)  Mammogram 1/15 neg - needs one -will schedule at [facility] Self exam-no lumps or changes   Flu shot  - did not get this season   colonosc 12/11 with adenomatous polyp - thinks she had a 7 year recall ?   Td 9/13  bp is stable today (up a few points on first check)  No cp or palpitations or headaches or edema  No side effects to medicines  BP Readings from Last 3 Encounters:  07/16/14 144/82  03/20/13 142/84  02/20/13 140/86     Lipids Lab Results  Component Value Date   CHOL 205* 07/11/2014   CHOL 200 02/20/2013   CHOL 196 11/03/2011   Lab Results  Component Value Date   HDL 60.90 07/11/2014   HDL 50.90 02/20/2013   HDL 61.30 11/03/2011   Lab Results  Component Value Date   LDLCALC 132* 07/11/2014   LDLCALC 130* 02/20/2013   LDLCALC 119* 11/03/2011   Lab Results  Component Value Date   TRIG 63.0 07/11/2014   TRIG 95.0 02/20/2013   TRIG 79.0  11/03/2011   Lab Results  Component Value Date   CHOLHDL 3 07/11/2014   CHOLHDL 4 02/20/2013   CHOLHDL 3 11/03/2011   No results found for: LDLDIRECT  Improved HDL - has been exercising   Results for orders placed or performed in visit on 07/11/14  CBC with Differential/Platelet  Result Value Ref Range   WBC 4.8 4.0 - 10.5 K/uL   RBC 4.62 3.87 - 5.11 Mil/uL   Hemoglobin 12.4 12.0 - 15.0 g/dL   HCT 37.6 36.0 - 46.0 %   MCV 81.4 78.0 - 100.0 fl   MCHC 33.0 30.0 - 36.0 g/dL   RDW 13.7 11.5 - 15.5 %   Platelets 313.0 150.0 - 400.0 K/uL   Neutrophils Relative % 37.6 (L) 43.0 - 77.0 %   Lymphocytes Relative 52.2 (H) 12.0 - 46.0 %   Monocytes Relative 7.0 3.0 - 12.0 %   Eosinophils Relative 2.2 0.0 - 5.0 %   Basophils Relative 1.0 0.0 - 3.0 %   Neutro Abs 1.8 1.4 - 7.7 K/uL   Lymphs Abs 2.5 0.7 - 4.0 K/uL   Monocytes Absolute 0.3 0.1 -  1.0 K/uL   Eosinophils Absolute 0.1 0.0 - 0.7 K/uL   Basophils Absolute 0.0 0.0 - 0.1 K/uL  Comprehensive metabolic panel  Result Value Ref Range   Sodium 139 135 - 145 mEq/L   Potassium 3.7 3.5 - 5.1 mEq/L   Chloride 104 96 - 112 mEq/L   CO2 29 19 - 32 mEq/L   Glucose, Bld 103 (H) 70 - 99 mg/dL   BUN 12 6 - 23 mg/dL   Creatinine, Ser 0.72 0.40 - 1.20 mg/dL   Total Bilirubin 0.5 0.2 - 1.2 mg/dL   Alkaline Phosphatase 50 39 - 117 U/L   AST 16 0 - 37 U/L   ALT 14 0 - 35 U/L   Total Protein 6.6 6.0 - 8.3 g/dL   Albumin 3.8 3.5 - 5.2 g/dL   Calcium 9.3 8.4 - 10.5 mg/dL   GFR 108.45 >60.00 mL/min  Lipid panel  Result Value Ref Range   Cholesterol 205 (H) 0 - 200 mg/dL   Triglycerides 63.0 0.0 - 149.0 mg/dL   HDL 60.90 >39.00 mg/dL   VLDL 12.6 0.0 - 40.0 mg/dL   LDL Cholesterol 132 (H) 0 - 99 mg/dL   Total CHOL/HDL Ratio 3    NonHDL 144.10   TSH  Result Value Ref Range   TSH 0.81 0.35 - 4.50 uIU/mL    Overall stable   Patient Active Problem List   Diagnosis Date Noted  . Sinus pain 03/30/2013  . Acute sinusitis 02/20/2013  .  Benign paroxysmal positional vertigo 02/20/2013  . Obesity 11/10/2011  . Routine general medical examination at a health care facility 11/01/2011  . Allergic rhinitis 07/16/2011  . OSA (obstructive sleep apnea) 07/16/2011  . Hypokalemia 12/15/2010  . DEPENDENT EDEMA, LEGS, BILATERAL 10/11/2008  . ANEMIA-NOS 10/26/2006  . Essential hypertension 10/26/2006  . GERD 10/26/2006  . DIVERTICULOSIS, COLON 10/26/2006  . COLONIC POLYPS, HX OF 10/26/2006   Past Medical History  Diagnosis Date  . Anemia     NOS  . GERD (gastroesophageal reflux disease)   . Hypertension   . Benign neoplasm of colon   . Diverticulosis of colon (without mention of hemorrhage)   . Rash and other nonspecific skin eruption   . Edema   . Contact with or exposure to venereal diseases   . Pruritus of genital organs   . Unspecified symptom associated with female genital organs   . Arthritis   . H/O: pneumonia    Past Surgical History  Procedure Laterality Date  . Knee arthroscopy      right  . Knee surgery  10/04  . Partial hysterectomy    . Cardiac valve surgery    . Rectal polypectomy  8/07  . Colonoscopy  7/03    polyps and tics   History  Substance Use Topics  . Smoking status: Former Smoker -- 0.80 packs/day for 30 years    Types: Cigarettes    Quit date: 03/01/2002  . Smokeless tobacco: Never Used  . Alcohol Use: 0.0 oz/week    0 Standard drinks or equivalent per week     Comment: social   Family History  Problem Relation Age of Onset  . Colon cancer Neg Hx   . Heart disease Maternal Grandmother   . Cancer Paternal Aunt     type unknown  . Cerebral aneurysm Paternal Aunt   . Cerebral aneurysm Cousin    Allergies  Allergen Reactions  . Lisinopril     cough   Current Outpatient Prescriptions  on File Prior to Visit  Medication Sig Dispense Refill  . acetaminophen (TYLENOL) 650 MG CR tablet Take 650 mg by mouth every 8 (eight) hours as needed.      Marland Kitchen amLODipine (NORVASC) 5 MG tablet  TAKE ONE TABLET BY MOUTH ONCE DAILY 30 tablet 1  . diphenhydrAMINE (BENADRYL) 50 MG capsule Take 50 mg by mouth as needed.    . hydrochlorothiazide (HYDRODIURIL) 25 MG tablet TAKE ONE TABLET BY MOUTH ONCE DAILY 30 tablet 1  . meclizine (ANTIVERT) 25 MG tablet Take 1 tablet (25 mg total) by mouth 3 (three) times daily as needed for dizziness. 30 tablet 0  . mometasone (NASONEX) 50 MCG/ACT nasal spray Place 2 sprays into the nose daily.      Marland Kitchen omeprazole (PRILOSEC) 20 MG capsule Take 20 mg by mouth daily.      . potassium chloride SA (KLOR-CON M20) 20 MEQ tablet TAKE ONE TABLET BY MOUTH ONCE DAILY 90 tablet 3  . albuterol (PROVENTIL HFA;VENTOLIN HFA) 108 (90 BASE) MCG/ACT inhaler Inhale 2 puffs into the lungs every 6 (six) hours as needed for wheezing. (Patient not taking: Reported on 07/16/2014) 1 Inhaler 0   No current facility-administered medications on file prior to visit.     Review of Systems Review of Systems  Constitutional: Negative for fever, appetite change, fatigue and unexpected weight change.  Eyes: Negative for pain and visual disturbance.  Respiratory: Negative for cough and shortness of breath.   Cardiovascular: Negative for cp or palpitations    Gastrointestinal: Negative for nausea, diarrhea and constipation. neg for blood in stool or dark stool  Genitourinary: Negative for urgency and frequency. pos for intermittent R pelvic pain/ cramping  Skin: Negative for pallor or rash   Neurological: Negative for weakness, light-headedness, numbness and headaches.  Hematological: Negative for adenopathy. Does not bruise/bleed easily.  Psychiatric/Behavioral: Negative for dysphoric mood. The patient is not nervous/anxious.         Objective:   Physical Exam  Constitutional: She appears well-developed and well-nourished. No distress.  obese and well appearing   HENT:  Head: Normocephalic and atraumatic.  Right Ear: External ear normal.  Left Ear: External ear normal.    Mouth/Throat: Oropharynx is clear and moist.  Eyes: Conjunctivae and EOM are normal. Pupils are equal, round, and reactive to light. No scleral icterus.  Neck: Normal range of motion. Neck supple. No JVD present. Carotid bruit is not present. No thyromegaly present.  Cardiovascular: Normal rate, regular rhythm and intact distal pulses.  Exam reveals no gallop.   Murmur heard. Pulmonary/Chest: Effort normal and breath sounds normal. No respiratory distress. She has no wheezes. She exhibits no tenderness.  Abdominal: Soft. Bowel sounds are normal. She exhibits no distension, no abdominal bruit and no mass. There is no tenderness.  Genitourinary: No breast swelling, tenderness, discharge or bleeding.  Breast exam: No mass, nodules, thickening, tenderness, bulging, retraction, inflamation, nipple discharge or skin changes noted.  No axillary or clavicular LA.     Bimanual pelvic exam- atrophic vag mucosa, mild R sided tenderness with no mass felt   Musculoskeletal: Normal range of motion. She exhibits no edema or tenderness.  Lymphadenopathy:    She has no cervical adenopathy.  Neurological: She is alert. She has normal reflexes. No cranial nerve deficit. She exhibits normal muscle tone. Coordination normal.  Skin: Skin is warm and dry. No rash noted. No erythema. No pallor.  Baseline mid sternal scar  Psychiatric: She has a normal mood and affect.  Assessment & Plan:   Problem List Items Addressed This Visit    Essential hypertension    bp in fair control at this time  BP Readings from Last 1 Encounters:  07/16/14 135/80   No changes needed Disc lifstyle change with low sodium diet and exercise  Labs reviewed       Obesity    Great job with wt loss effort so far- commended  Plans to begin more regular exercise  Discussed how this problem influences overall health and the risks it imposes  Reviewed plan for weight loss with lower calorie diet (via better food choices  and also portion control or program like weight watchers) and exercise building up to or more than 30 minutes 5 days per week including some aerobic activity         Pelvic pain in female    R sided intermittent times 2 mo -slt tender today on bimanual exam (also atrophic vag mucosa) Hx of partial hysterectomy Likely menopausal or close to it [place] to r/o ovarian cyst or other      Relevant Orders   US Pelvis Complete   US Transvaginal Non-OB   Routine general medical examination at a health care facility - Primary    Reviewed health habits including diet and exercise and skin cancer prevention Reviewed appropriate screening tests for age  Also reviewed health mt list, fam hx and immunization status , as well as social and family history   Pt will schedule her own mammogram Labs rev See HPI Enc exercise program

## 2014-07-16 NOTE — Assessment & Plan Note (Signed)
bp in fair control at this time  BP Readings from Last 1 Encounters:  07/16/14 135/80   No changes needed Disc lifstyle change with low sodium diet and exercise  Labs reviewed

## 2014-07-16 NOTE — Assessment & Plan Note (Signed)
Reviewed health habits including diet and exercise and skin cancer prevention Reviewed appropriate screening tests for age  Also reviewed health mt list, fam hx and immunization status , as well as social and family history   Pt will schedule her own mammogram Labs rev See HPI Enc exercise program

## 2014-07-16 NOTE — Assessment & Plan Note (Signed)
R sided intermittent times 2 mo -slt tender today on bimanual exam (also atrophic vag mucosa) Hx of partial hysterectomy Likely menopausal or close to it [place] to r/o ovarian cyst or other

## 2014-07-16 NOTE — Progress Notes (Signed)
Pre visit review using our clinic review tool, if applicable. No additional management support is needed unless otherwise documented below in the visit note. 

## 2014-07-25 ENCOUNTER — Other Ambulatory Visit: Payer: Self-pay | Admitting: Family Medicine

## 2014-07-25 DIAGNOSIS — Z1231 Encounter for screening mammogram for malignant neoplasm of breast: Secondary | ICD-10-CM

## 2014-07-29 ENCOUNTER — Ambulatory Visit
Admission: RE | Admit: 2014-07-29 | Discharge: 2014-07-29 | Disposition: A | Discharge status: Home / Self Care | Payer: Commercial Managed Care - PPO | Source: Ambulatory Visit | Attending: Family Medicine | Admitting: Family Medicine

## 2014-07-29 ENCOUNTER — Ambulatory Visit (HOSPITAL_COMMUNITY)
Admission: RE | Admit: 2014-07-29 | Discharge: 2014-07-29 | Disposition: A | Discharge status: Home / Self Care | Payer: Commercial Managed Care - PPO | Source: Ambulatory Visit | Attending: Family Medicine | Admitting: Family Medicine

## 2014-07-29 DIAGNOSIS — Z1231 Encounter for screening mammogram for malignant neoplasm of breast: Secondary | ICD-10-CM | POA: Diagnosis not present

## 2014-07-29 DIAGNOSIS — R102 Pelvic and perineal pain: Secondary | ICD-10-CM

## 2014-07-29 LAB — HM MAMMOGRAPHY: HM Mammogram: NORMAL

## 2014-07-30 ENCOUNTER — Telehealth: Payer: Self-pay | Admitting: Family Medicine

## 2014-07-30 ENCOUNTER — Encounter: Payer: Self-pay | Admitting: *Deleted

## 2014-07-30 ENCOUNTER — Encounter: Payer: Self-pay | Admitting: Family Medicine

## 2014-07-30 DIAGNOSIS — R609 Edema, unspecified: Secondary | ICD-10-CM

## 2014-07-30 DIAGNOSIS — N949 Unspecified condition associated with female genital organs and menstrual cycle: Secondary | ICD-10-CM

## 2014-07-30 NOTE — Telephone Encounter (Signed)
-----   Message from Tammi Sou, [place] sent at 07/30/2014 12:24 PM EDT ----- Pt notified of [place] results and Dr. Marliss Coots comments. Pt agrees with referral. I advise pt our [facility] will call her to schedule appt., please put referral in

## 2014-07-30 NOTE — Telephone Encounter (Signed)
Ref to gyn for adnexal cyst please

## 2014-07-31 ENCOUNTER — Other Ambulatory Visit: Payer: Self-pay | Admitting: Family Medicine

## 2014-08-12 ENCOUNTER — Ambulatory Visit (INDEPENDENT_AMBULATORY_CARE_PROVIDER_SITE_OTHER): Payer: Commercial Managed Care - PPO | Admitting: Family Medicine

## 2014-08-12 ENCOUNTER — Encounter: Payer: Self-pay | Admitting: Family Medicine

## 2014-08-12 ENCOUNTER — Encounter: Payer: Commercial Managed Care - PPO | Admitting: Family Medicine

## 2014-08-12 VITALS — BP 146/81 | HR 65 | Wt 216.0 lb

## 2014-08-12 DIAGNOSIS — N949 Unspecified condition associated with female genital organs and menstrual cycle: Secondary | ICD-10-CM | POA: Diagnosis not present

## 2014-08-12 NOTE — Patient Instructions (Signed)
Ovarian Cyst An ovarian cyst is a fluid-filled sac that forms on an ovary. The ovaries are small organs that produce eggs in women. Various types of cysts can form on the ovaries. Most are not cancerous. Many do not cause problems, and they often go away on their own. Some may cause symptoms and require treatment. Common types of ovarian cysts include:  Functional cysts--These cysts may occur every month during the menstrual cycle. This is normal. The cysts usually go away with the next menstrual cycle if the woman does not get pregnant. Usually, there are no symptoms with a functional cyst. Simple appearing and most certainly benign.  Endometrioma cysts--These cysts form from the tissue that lines the uterus. They are also called "chocolate cysts" because they become filled with blood that turns brown. This type of cyst can cause pain in the lower abdomen during intercourse and with your menstrual period.  Cystadenoma cysts--This type develops from the cells on the outside of the ovary. These cysts can get very big and cause lower abdomen pain and pain with intercourse. This type of cyst can twist on itself, cut off its blood supply, and cause severe pain. It can also easily rupture and cause a lot of pain.  Dermoid cysts--This type of cyst is sometimes found in both ovaries. These cysts may contain different kinds of body tissue, such as skin, teeth, hair, or cartilage. They usually do not cause symptoms unless they get very big.  Theca lutein cysts--These cysts occur when too much of a certain hormone (human chorionic gonadotropin) is produced and overstimulates the ovaries to produce an egg. This is most common after procedures used to assist with the conception of a baby (in vitro fertilization). CAUSES   Fertility drugs can cause a condition in which multiple large cysts are formed on the ovaries. This is called ovarian hyperstimulation syndrome.  A condition called polycystic ovary syndrome  can cause hormonal imbalances that can lead to nonfunctional ovarian cysts. SIGNS AND SYMPTOMS  Many ovarian cysts do not cause symptoms. If symptoms are present, they may include:  Pelvic pain or pressure.  Pain in the lower abdomen.  Pain during sexual intercourse.  Increasing girth (swelling) of the abdomen.  Abnormal menstrual periods.  Increasing pain with menstrual periods.  Stopping having menstrual periods without being pregnant. DIAGNOSIS  These cysts are commonly found during a routine or annual pelvic exam. Tests may be ordered to find out more about the cyst. These tests may include:  Ultrasound.  X-ray of the pelvis.  CT scan.  MRI.  Blood tests. TREATMENT  Many ovarian cysts go away on their own without treatment. Your health care provider may want to check your cyst regularly for 2-3 months to see if it changes. For women in menopause, it is particularly important to monitor a cyst closely because of the higher rate of ovarian cancer in menopausal women. When treatment is needed, it may include any of the following:  A procedure to drain the cyst (aspiration). This may be done using a long needle and ultrasound. It can also be done through a laparoscopic procedure. This involves using a thin, lighted tube with a tiny camera on the end (laparoscope) inserted through a small incision.  Surgery to remove the whole cyst. This may be done using laparoscopic surgery or an open surgery involving a larger incision in the lower abdomen.  Hormone treatment or birth control pills. These methods are sometimes used to help dissolve a cyst. HOME CARE  INSTRUCTIONS   Only take over-the-counter or prescription medicines as directed by your health care provider.  Follow up with your health care provider as directed.  Get regular pelvic exams and Pap tests. SEEK MEDICAL CARE IF:   Your periods are late, irregular, or painful, or they stop.  Your pelvic pain or abdominal  pain does not go away.  Your abdomen becomes larger or swollen.  You have pressure on your bladder or trouble emptying your bladder completely.  You have pain during sexual intercourse.  You have feelings of fullness, pressure, or discomfort in your stomach.  You lose weight for no apparent reason.  You feel generally ill.  You become constipated.  You lose your appetite.  You develop acne.  You have an increase in body and facial hair.  You are gaining weight, without changing your exercise and eating habits.  You think you are pregnant. SEEK IMMEDIATE MEDICAL CARE IF:   You have increasing abdominal pain.  You feel sick to your stomach (nauseous), and you throw up (vomit).  You develop a fever that comes on suddenly.  You have abdominal pain during a bowel movement.  Your menstrual periods become heavier than usual. MAKE SURE YOU:  Understand these instructions.  Will watch your condition.  Will get help right away if you are not doing well or get worse. Document Released: 02/22/2005 Document Revised: 02/27/2013 Document Reviewed: 10/30/2012 [facility] Patient Information 2015 [place], [place]. This information is not intended to replace advice given to you by your health care provider. Make sure you discuss any questions you have with your health care provider.

## 2014-08-12 NOTE — Progress Notes (Signed)
    Subjective:    Patient ID: Crystal Tucker is a 54 y.o. female presenting with right adnexal cyst  on 08/12/2014  HPI: Patient is referred today from Dr. Loura Pardon with right sided pelvic pain.  Has h/o previous hysterectomy due to falling out, after child birth. Reports painful right sided pain. Has been present for some time. Usually takes Tylenol, but it is not helping. Patient had an u/s which shows a simple cyst, which is most certainly benign. Pain is worse with stooping and bending.  Relevant portions of the PMH, PSH, FH, Social hx, meds and allergies were reviewed.  Review of Systems  Constitutional: Negative for fever and chills.  Respiratory: Negative for shortness of breath.   Cardiovascular: Negative for chest pain.  Gastrointestinal: Negative for nausea, vomiting and abdominal pain.  Genitourinary: Negative for dysuria.  Skin: Negative for rash.      Objective:    BP 146/81 mmHg  Pulse 65  Wt 216 lb (97.977 kg) Physical Exam  Constitutional: She is oriented to person, place, and time. She appears well-developed and well-nourished. No distress.  HENT:  Head: Normocephalic and atraumatic.  Eyes: No scleral icterus.  Neck: Neck supple.  Cardiovascular: Normal rate.   Pulmonary/Chest: Effort normal.  Abdominal: Soft.  Neurological: She is alert and oriented to person, place, and time.  Skin: Skin is warm and dry.  Psychiatric: She has a normal mood and affect.        Assessment & Plan:   Problem List Items Addressed This Visit      Unprioritized   Adnexal cyst - Primary    Simple cyst, may be new, may have been there for some time.  It may or may not be causing her pain.  It could be a simple inclusion cyst or a hydrosalpinx. It is not worrisome at all.  Reasons to remove it include pain. Given her previous surgery, there are risks to having a procedure. Risks include but are not limited to bleeding, infection, injury to surrounding structures, including  bowel, bladder and ureters, blood clots, and death.  Likelihood of success is medium. After careful consideration, the patient opted to discuss further with family. If we watch the cyst, we should f/u u/s in 1 year to assure no changes. She may try NSAIDS prn with food for pain control.           Return in about 3 months (around 11/12/2014).  PRATT,TANYA S 08/12/2014 3:43 PM

## 2014-08-12 NOTE — Assessment & Plan Note (Signed)
Simple cyst, may be new, may have been there for some time.  It may or may not be causing her pain.  It could be a simple inclusion cyst or a hydrosalpinx. It is not worrisome at all.  Reasons to remove it include pain. Given her previous surgery, there are risks to having a procedure. Risks include but are not limited to bleeding, infection, injury to surrounding structures, including bowel, bladder and ureters, blood clots, and death.  Likelihood of success is medium. After careful consideration, the patient opted to discuss further with family. If we watch the cyst, we should f/u u/s in 1 year to assure no changes. She may try NSAIDS prn with food for pain control.

## 2014-08-13 ENCOUNTER — Telehealth: Payer: Self-pay

## 2014-08-13 NOTE — Telephone Encounter (Signed)
I will send a flag to Dr Kennon Rounds about the pain dilemma

## 2014-08-13 NOTE — Telephone Encounter (Signed)
Pt notified of Dr. Marliss Coots comments. Pt said that you recommended a biopsy and she spoke with Dr. Kennon Rounds and she didn't feel like a biopsy was needed since it didn't look cancerous but pt is still wanting a biopsy and wanted to know what to you think she should do

## 2014-08-13 NOTE — Telephone Encounter (Signed)
Per Dr. Glori Bickers:  This is the response from Dr Kennon Rounds -it sounds like she has offered surgery if needed - she is not 100% sure this is causing her pain  They may need to discuss it again. Thanks   Patient advised.  Patient wishes to schedule an appointment with Dr. Glori Bickers to discuss because she says she hasn't been able to understand the facts about having the surgery or not having the surgery.  Patient will call back to schedule OV when she can check her work schedule.

## 2014-08-13 NOTE — Telephone Encounter (Signed)
Pt saw Dr Kennon Rounds on 08/12/14 and wants advice from Dr Glori Bickers how to proceed; Dr Kennon Rounds gave pt the option to decide to remove cyst or not and if she decides to wait Dr Kennon Rounds will do another [place] in one year. Dr Kennon Rounds gave pt pros and cons about having the surgery or not; Dr Kennon Rounds does not want to do a biopsy at this time. Pt said cyst causing pain and Motrin upsets pts stomach due to diverticulitis. Pt request cb.

## 2014-08-13 NOTE — Telephone Encounter (Signed)
If the cyst causes pain then it sounds like she should discuss removing it with her gyn

## 2014-09-06 ENCOUNTER — Ambulatory Visit (INDEPENDENT_AMBULATORY_CARE_PROVIDER_SITE_OTHER): Payer: Commercial Managed Care - PPO | Admitting: Family Medicine

## 2014-09-06 ENCOUNTER — Encounter: Payer: Self-pay | Admitting: Family Medicine

## 2014-09-06 VITALS — BP 128/80 | HR 65 | Temp 98.8°F | Ht 67.5 in | Wt 209.5 lb

## 2014-09-06 DIAGNOSIS — K219 Gastro-esophageal reflux disease without esophagitis: Secondary | ICD-10-CM | POA: Diagnosis not present

## 2014-09-06 DIAGNOSIS — N949 Unspecified condition associated with female genital organs and menstrual cycle: Secondary | ICD-10-CM

## 2014-09-06 MED ORDER — OMEPRAZOLE 20 MG PO CPDR: 20.0000 mg | DELAYED_RELEASE_CAPSULE | Freq: Every day | ORAL | Status: DC

## 2014-09-06 NOTE — Patient Instructions (Signed)
Stop at check out for referral to gyn  Continue current medications  Great job with weight loss so far

## 2014-09-06 NOTE — Progress Notes (Signed)
Pre visit review using our clinic review tool, if applicable. No additional management support is needed unless otherwise documented below in the visit note. 

## 2014-09-06 NOTE — Progress Notes (Signed)
Subjective:    Patient ID: Crystal Tucker, female    DOB: [birth year], 54 y.o.   MRN: [id number]  HPI Here for f/u of gyn visit   Saw gyn with adnexal cyst 5.7 times 4.6 times 5.0 cm   Is going through hot flashes and nt sweats - knows she is menopausal   At times she is in a lot of pain  She does want to remove it  Would also like a 2nd opinion   Would like to go to [employer] omeprazole every day for GERD- would like to see if it is less $ with insurance  This controls her symptoms well  Ins denied aciphex in the past     Patient Active Problem List   Diagnosis Date Noted  . Adnexal cyst 07/30/2014  . Pelvic pain in female 07/16/2014  . Benign paroxysmal positional vertigo 02/20/2013  . Obesity 11/10/2011  . Routine general medical examination at a health care facility 11/01/2011  . Allergic rhinitis 07/16/2011  . OSA (obstructive sleep apnea) 07/16/2011  . DEPENDENT EDEMA, LEGS, BILATERAL 10/11/2008  . ANEMIA-NOS 10/26/2006  . Essential hypertension 10/26/2006  . GERD 10/26/2006  . DIVERTICULOSIS, COLON 10/26/2006  . COLONIC POLYPS, HX OF 10/26/2006   Past Medical History  Diagnosis Date  . Anemia     NOS  . GERD (gastroesophageal reflux disease)   . Hypertension   . Benign neoplasm of colon   . Diverticulosis of colon (without mention of hemorrhage)   . Rash and other nonspecific skin eruption   . Edema   . Contact with or exposure to venereal diseases   . Pruritus of genital organs   . Unspecified symptom associated with female genital organs   . Arthritis   . H/O: pneumonia    Past Surgical History  Procedure Laterality Date  . Knee arthroscopy      right  . Knee surgery  10/04  . Partial hysterectomy    . Cardiac valve surgery    . Rectal polypectomy  8/07  . Colonoscopy  7/03    polyps and tics   History  Substance Use Topics  . Smoking status: Former Smoker -- 0.80 packs/day for 30 years    Types: Cigarettes    Quit date: 03/01/2002    . Smokeless tobacco: Never Used  . Alcohol Use: 0.0 oz/week    0 Standard drinks or equivalent per week     Comment: rare   Family History  Problem Relation Age of Onset  . Colon cancer Neg Hx   . Heart disease Maternal Grandmother   . Cancer Paternal Aunt     type unknown  . Cerebral aneurysm Paternal Aunt   . Cerebral aneurysm Cousin    Allergies  Allergen Reactions  . Lisinopril     cough   Current Outpatient Prescriptions on File Prior to Visit  Medication Sig Dispense Refill  . acetaminophen (TYLENOL) 650 MG CR tablet Take 650 mg by mouth every 8 (eight) hours as needed.      Marland Kitchen albuterol (PROVENTIL HFA;VENTOLIN HFA) 108 (90 BASE) MCG/ACT inhaler Inhale 2 puffs into the lungs every 6 (six) hours as needed for wheezing. 1 Inhaler 5  . amLODipine (NORVASC) 5 MG tablet TAKE ONE TABLET BY MOUTH ONCE DAILY 30 tablet 5  . diphenhydrAMINE (BENADRYL) 50 MG capsule Take 50 mg by mouth as needed.    . hydrochlorothiazide (HYDRODIURIL) 25 MG tablet TAKE ONE TABLET BY MOUTH ONCE DAILY 30 tablet  5  . meclizine (ANTIVERT) 25 MG tablet Take 1 tablet (25 mg total) by mouth 3 (three) times daily as needed for dizziness. 30 tablet 0  . mometasone (NASONEX) 50 MCG/ACT nasal spray Place 2 sprays into the nose daily.      Marland Kitchen omeprazole (PRILOSEC) 20 MG capsule Take 20 mg by mouth daily.      . potassium chloride SA (KLOR-CON M20) 20 MEQ tablet TAKE ONE TABLET BY MOUTH ONCE DAILY 90 tablet 3   No current facility-administered medications on file prior to visit.     Review of Systems Review of Systems  Constitutional: Negative for fever, appetite change, fatigue and unexpected weight change.  Eyes: Negative for pain and visual disturbance.  Respiratory: Negative for cough and shortness of breath.   Cardiovascular: Negative for cp or palpitations    Gastrointestinal: Negative for nausea, diarrhea and constipation.  Genitourinary: Negative for urgency and frequency. pos for R sided pelvic pain  and pressure ongoing  Skin: Negative for pallor or rash   Neurological: Negative for weakness, light-headedness, numbness and headaches.  Hematological: Negative for adenopathy. Does not bruise/bleed easily.  Psychiatric/Behavioral: Negative for dysphoric mood. The patient is not nervous/anxious.         Objective:   Physical Exam  Constitutional: She appears well-developed and well-nourished. No distress.  overwt and well app  HENT:  Head: Normocephalic and atraumatic.  Eyes: Conjunctivae and EOM are normal. Pupils are equal, round, and reactive to light. No scleral icterus.  Neck: Normal range of motion. Neck supple.  Cardiovascular: Normal rate and regular rhythm.   Pulmonary/Chest: Effort normal and breath sounds normal.  Abdominal: Soft. Bowel sounds are normal. She exhibits no distension and no mass. There is tenderness. There is no rebound and no guarding.  Tender in R pelvic area  No rebound or guarding   Musculoskeletal: She exhibits no edema.  Lymphadenopathy:    She has no cervical adenopathy.  Neurological: She is alert.  Skin: Skin is warm and dry. No rash noted.  Psychiatric: She has a normal mood and affect.          Assessment & Plan:   Problem List Items Addressed This Visit    Adnexal cyst    This causes moderate discomfort  Pt has questions and would like a 2nd opinion about getting it removed Ref to GYN Rev study- appears to likely be benign      Relevant Orders   Ambulatory referral to Gynecology   GERD - Primary    Omeprazole controls symptoms (though aciphex worked better and ins will not cover) Px for a year written Disc GERD diet       Relevant Medications   omeprazole (PRILOSEC) 20 MG capsule

## 2014-09-09 NOTE — Assessment & Plan Note (Signed)
Omeprazole controls symptoms (though aciphex worked better and ins will not cover) Px for a year written Disc GERD diet

## 2014-09-09 NOTE — Assessment & Plan Note (Signed)
This causes moderate discomfort  Pt has questions and would like a 2nd opinion about getting it removed Ref to GYN Rev study- appears to likely be benign

## 2014-10-29 NOTE — H&P (Signed)
Crystal Tucker  DICTATION # [id number] CSN# [id number]   Margarette Asal, MD 10/29/2014 9:32 AM

## 2014-10-31 NOTE — H&P (Signed)
NAMEDALILAH, CURLIN              ACCOUNT NO.:  [id number]  MEDICAL RECORD NO.:  [id number]  LOCATION:                                 FACILITY:  PHYSICIAN:  Ralene Bathe. Matthew Saras, M.D.    DATE OF BIRTH:  DATE OF ADMISSION:  11/12/2014 DATE OF DISCHARGE:                             HISTORY & PHYSICAL   CHIEF COMPLAINT:  Pelvic pain, ovarian cyst.  HISTORY OF PRESENT ILLNESS:  A 54 year old postmenopausal G2, P1.  This patient had a hysterectomy at age 89, both ovaries were conserved at that time.  She saw her medical doctor in June of this year, and because of pelvic discomfort, had a pelvic ultrasound that showed a simple cyst on the right, 5.7 x 4.6 x 5.0.  No other abnormalities.  CA-125 returned normal.  Followup ultrasound, her pain had actually worsened.  CA-125 returned a level of 7.  Due to continued pain, she prefers to proceed with a laparoscopy with RSO.  This procedure including specific risks related to bleeding, infection, adjacent organ injury, the possible need to complete the surgery by open technique, all discussed with her which she understands and accepts.  ALLERGIES:  None.  CURRENT MEDICATIONS:  Omeprazole, albuterol, KCl, amlodipine, diphenhydramine, HCTZ, meclizine.  SURGICAL HISTORY:  She has had 2 knee surgeries and hysterectomy, also had unspecified type of open heart surgery in 1998 which we will clarify.  SOCIAL HISTORY:  Denies drug or tobacco use.  She does drink alcohol socially.  She is separated.  Dr. Loura Pardon is her medical doctor.  PHYSICAL EXAMINATION:  VITAL SIGNS:  Temp 98.2, blood pressure 130/78. HEENT:  Unremarkable. NECK:  Supple without masses. LUNGS:  Clear. CARDIOVASCULAR:  Regular rate and rhythm without murmurs, rubs, gallops noted. BREASTS:  Without masses. ABDOMEN:  Soft, flat, nontender. PELVIC:  Vulva, vagina, vaginal cuff normal, slightly full on the right side.  No definite mass noted. EXTREMITIES:   Unremarkable. NEUROLOGIC:  Unremarkable.  IMPRESSION:  Persistent ovarian cyst, pelvic pain.  PLAN:  Laparoscopy with RSO procedure.  Risks discussed as above.     Latrena Benegas M. Matthew Saras, M.D.     [facility]  D:  10/29/2014  T:  10/29/2014  Job:  [id number]

## 2014-11-07 ENCOUNTER — Encounter (HOSPITAL_COMMUNITY)
Admission: RE | Admit: 2014-11-07 | Discharge: 2014-11-07 | Disposition: A | Discharge status: Home / Self Care | Payer: Commercial Managed Care - PPO | Source: Ambulatory Visit | Attending: Obstetrics and Gynecology | Admitting: Obstetrics and Gynecology

## 2014-11-07 ENCOUNTER — Other Ambulatory Visit: Payer: Self-pay

## 2014-11-07 ENCOUNTER — Encounter (HOSPITAL_COMMUNITY): Payer: Self-pay

## 2014-11-07 DIAGNOSIS — R102 Pelvic and perineal pain: Secondary | ICD-10-CM | POA: Diagnosis not present

## 2014-11-07 DIAGNOSIS — N8320 Unspecified ovarian cysts: Secondary | ICD-10-CM | POA: Insufficient documentation

## 2014-11-07 DIAGNOSIS — Z01818 Encounter for other preprocedural examination: Secondary | ICD-10-CM | POA: Insufficient documentation

## 2014-11-07 HISTORY — DX: Pneumonia, unspecified organism: J18.9

## 2014-11-07 LAB — CBC
HCT: 36.7 % (ref 36.0–46.0)
Hemoglobin: 12 g/dL (ref 12.0–15.0)
MCH: 27.1 pg (ref 26.0–34.0)
MCHC: 32.7 g/dL (ref 30.0–36.0)
MCV: 82.8 fL (ref 78.0–100.0)
PLATELETS: 293 10*3/uL (ref 150–400)
RBC: 4.43 MIL/uL (ref 3.87–5.11)
RDW: 14.3 % (ref 11.5–15.5)
WBC: 6.8 10*3/uL (ref 4.0–10.5)

## 2014-11-07 LAB — BASIC METABOLIC PANEL
Anion gap: 10 (ref 5–15)
BUN: 13 mg/dL (ref 6–20)
CO2: 31 mmol/L (ref 22–32)
CREATININE: 0.79 mg/dL (ref 0.44–1.00)
Calcium: 9.7 mg/dL (ref 8.9–10.3)
Chloride: 100 mmol/L — ABNORMAL LOW (ref 101–111)
Glucose, Bld: 131 mg/dL — ABNORMAL HIGH (ref 65–99)
POTASSIUM: 3.3 mmol/L — AB (ref 3.5–5.1)
SODIUM: 141 mmol/L (ref 135–145)

## 2014-11-07 NOTE — Pre-Procedure Instructions (Signed)
Patient does not currently have her inhaler filled due to lack of money.

## 2014-11-07 NOTE — Pre-Procedure Instructions (Signed)
EKG abnormal at PAT visit today. Dr. Gifford Shave reviewed and no new orders received.

## 2014-11-07 NOTE — Patient Instructions (Signed)
Your procedure is scheduled on: November 12, 2014  Enter through the Main Entrance of [facility] at:  6:00 am   Pick up the phone at the desk and dial [phone].  Call this number if you have problems the morning of surgery: [phone].  Remember: Do NOT eat food:  After midnight Monday Do NOT drink clear liquids after:  Midnight on Monday  Take these medicines the morning of surgery with a SIP OF WATER:  Norvasc, prilosec , hydrochlorothiazide   Do NOT wear jewelry (body piercing), metal hair clips/bobby pins, make-up, or nail polish. Do NOT wear lotions, powders, or perfumes.  You may wear deoderant. Do NOT shave for 48 hours prior to surgery. Do NOT bring valuables to the hospital. Contacts, dentures, or bridgework may not be worn into surgery. Have a responsible adult drive you home and stay with you for 24 hours after your procedure

## 2014-11-11 ENCOUNTER — Encounter (HOSPITAL_COMMUNITY): Payer: Self-pay | Admitting: Anesthesiology

## 2014-11-11 MED ORDER — DEXTROSE 5 % IV SOLN
2.0000 g | INTRAVENOUS | Status: AC
  Administered 2014-11-12: 2 g via INTRAVENOUS
  Filled 2014-11-11: qty 2

## 2014-11-11 MED ORDER — SUCCINYLCHOLINE CHLORIDE 20 MG/ML IJ SOLN
INTRAMUSCULAR | Status: AC
  Filled 2014-11-11: qty 1

## 2014-11-11 NOTE — Anesthesia Preprocedure Evaluation (Addendum)
Anesthesia Evaluation  Patient identified by MRN, date of birth, ID band Patient awake    Reviewed: Allergy & Precautions, NPO status , Patient's Chart, lab work & pertinent test results, reviewed documented beta blocker date and time   Airway Mallampati: II  TM Distance: >3 FB Neck ROM: Full    Dental no notable dental hx. (+) Teeth Intact   Pulmonary neg pulmonary ROS, sleep apnea , pneumonia -, resolved, former smoker,  breath sounds clear to auscultation  Pulmonary exam normal       Cardiovascular hypertension, Pt. on medications Normal cardiovascular examRhythm:Regular Rate:Normal     Neuro/Psych negative neurological ROS  negative psych ROS   GI/Hepatic Neg liver ROS, GERD-  Medicated and Controlled,Hx/o colon polyps   Endo/Other  obesity  Renal/GU negative Renal ROS  negative genitourinary   Musculoskeletal  (+) Arthritis -, Osteoarthritis,    Abdominal (+) + obese,   Peds  Hematology  (+) anemia ,   Anesthesia Other Findings   Reproductive/Obstetrics Adnexal cysts Pelvic pain                            Anesthesia Physical Anesthesia Plan  ASA: II  Anesthesia Plan: General   Post-op Pain Management:    Induction: Intravenous and Cricoid pressure planned  Airway Management Planned: Oral ETT  Additional Equipment:   Intra-op Plan:   Post-operative Plan: Extubation in OR  Informed Consent: I have reviewed the patients History and Physical, chart, labs and discussed the procedure including the risks, benefits and alternatives for the proposed anesthesia with the patient or authorized representative who has indicated his/her understanding and acceptance.   Dental advisory given  Plan Discussed with: CRNA, Anesthesiologist and Surgeon  Anesthesia Plan Comments:         Anesthesia Quick Evaluation

## 2014-11-12 ENCOUNTER — Ambulatory Visit (HOSPITAL_COMMUNITY): Payer: Commercial Managed Care - PPO | Admitting: Anesthesiology

## 2014-11-12 ENCOUNTER — Encounter (HOSPITAL_COMMUNITY)
Admission: RE | Disposition: A | Discharge status: Home / Self Care | Payer: Self-pay | Source: Ambulatory Visit | Attending: Obstetrics and Gynecology

## 2014-11-12 ENCOUNTER — Ambulatory Visit (HOSPITAL_COMMUNITY)
Admission: RE | Admit: 2014-11-12 | Discharge: 2014-11-12 | Disposition: A | Discharge status: Home / Self Care | Payer: Commercial Managed Care - PPO | Source: Ambulatory Visit | Attending: Obstetrics and Gynecology | Admitting: Obstetrics and Gynecology

## 2014-11-12 ENCOUNTER — Encounter (HOSPITAL_COMMUNITY): Payer: Self-pay | Admitting: *Deleted

## 2014-11-12 DIAGNOSIS — I10 Essential (primary) hypertension: Secondary | ICD-10-CM | POA: Insufficient documentation

## 2014-11-12 DIAGNOSIS — G4730 Sleep apnea, unspecified: Secondary | ICD-10-CM | POA: Diagnosis not present

## 2014-11-12 DIAGNOSIS — K219 Gastro-esophageal reflux disease without esophagitis: Secondary | ICD-10-CM | POA: Diagnosis not present

## 2014-11-12 DIAGNOSIS — Z87891 Personal history of nicotine dependence: Secondary | ICD-10-CM | POA: Diagnosis not present

## 2014-11-12 DIAGNOSIS — D649 Anemia, unspecified: Secondary | ICD-10-CM | POA: Diagnosis not present

## 2014-11-12 DIAGNOSIS — N8320 Unspecified ovarian cysts: Secondary | ICD-10-CM | POA: Diagnosis present

## 2014-11-12 DIAGNOSIS — Z86010 Personal history of colonic polyps: Secondary | ICD-10-CM | POA: Insufficient documentation

## 2014-11-12 DIAGNOSIS — Z6832 Body mass index (BMI) 32.0-32.9, adult: Secondary | ICD-10-CM | POA: Diagnosis not present

## 2014-11-12 DIAGNOSIS — Z90710 Acquired absence of both cervix and uterus: Secondary | ICD-10-CM | POA: Insufficient documentation

## 2014-11-12 DIAGNOSIS — E669 Obesity, unspecified: Secondary | ICD-10-CM | POA: Diagnosis not present

## 2014-11-12 DIAGNOSIS — N736 Female pelvic peritoneal adhesions (postinfective): Secondary | ICD-10-CM | POA: Insufficient documentation

## 2014-11-12 DIAGNOSIS — M1990 Unspecified osteoarthritis, unspecified site: Secondary | ICD-10-CM | POA: Diagnosis not present

## 2014-11-12 DIAGNOSIS — D270 Benign neoplasm of right ovary: Secondary | ICD-10-CM | POA: Diagnosis not present

## 2014-11-12 HISTORY — PX: LAPAROSCOPY: SHX197

## 2014-11-12 HISTORY — PX: SALPINGOOPHORECTOMY: SHX82

## 2014-11-12 SURGERY — LAPAROSCOPY, DIAGNOSTIC
Anesthesia: General | Site: Abdomen | Laterality: Right

## 2014-11-12 MED ORDER — GLYCOPYRROLATE 0.2 MG/ML IJ SOLN
INTRAMUSCULAR | Status: DC | PRN
  Administered 2014-11-12: .6 mg via INTRAVENOUS

## 2014-11-12 MED ORDER — PROPOFOL 10 MG/ML IV BOLUS
INTRAVENOUS | Status: AC
  Filled 2014-11-12: qty 20

## 2014-11-12 MED ORDER — MIDAZOLAM HCL 2 MG/2ML IJ SOLN
INTRAMUSCULAR | Status: DC | PRN
  Administered 2014-11-12: 2 mg via INTRAVENOUS

## 2014-11-12 MED ORDER — FENTANYL CITRATE (PF) 250 MCG/5ML IJ SOLN
INTRAMUSCULAR | Status: AC
  Filled 2014-11-12: qty 25

## 2014-11-12 MED ORDER — FENTANYL CITRATE (PF) 100 MCG/2ML IJ SOLN
25.0000 ug | INTRAMUSCULAR | Status: DC | PRN
  Administered 2014-11-12: 25 ug via INTRAVENOUS

## 2014-11-12 MED ORDER — GLYCOPYRROLATE 0.2 MG/ML IJ SOLN
INTRAMUSCULAR | Status: AC
  Filled 2014-11-12: qty 1

## 2014-11-12 MED ORDER — BUPIVACAINE HCL (PF) 0.25 % IJ SOLN
INTRAMUSCULAR | Status: DC | PRN
  Administered 2014-11-12: 30 mL

## 2014-11-12 MED ORDER — LACTATED RINGERS IV SOLN
INTRAVENOUS | Status: DC
  Administered 2014-11-12 (×2): via INTRAVENOUS

## 2014-11-12 MED ORDER — LIDOCAINE HCL (CARDIAC) 20 MG/ML IV SOLN
INTRAVENOUS | Status: DC | PRN
  Administered 2014-11-12: 60 mg via INTRAVENOUS

## 2014-11-12 MED ORDER — MEPERIDINE HCL 25 MG/ML IJ SOLN: 6.2500 mg | INTRAMUSCULAR | Status: DC | PRN

## 2014-11-12 MED ORDER — METOCLOPRAMIDE HCL 5 MG/ML IJ SOLN: 10.0000 mg | Freq: Once | INTRAMUSCULAR | Status: DC | PRN

## 2014-11-12 MED ORDER — DEXAMETHASONE SODIUM PHOSPHATE 4 MG/ML IJ SOLN
INTRAMUSCULAR | Status: DC | PRN
  Administered 2014-11-12: 4 mg via INTRAVENOUS

## 2014-11-12 MED ORDER — NEOSTIGMINE METHYLSULFATE 10 MG/10ML IV SOLN
INTRAVENOUS | Status: AC
  Filled 2014-11-12: qty 1

## 2014-11-12 MED ORDER — FENTANYL CITRATE (PF) 100 MCG/2ML IJ SOLN
INTRAMUSCULAR | Status: DC | PRN
  Administered 2014-11-12 (×2): 50 ug via INTRAVENOUS
  Administered 2014-11-12: 100 ug via INTRAVENOUS

## 2014-11-12 MED ORDER — GLYCOPYRROLATE 0.2 MG/ML IJ SOLN
INTRAMUSCULAR | Status: AC
Start: 2014-11-12 — End: 2014-11-12
  Filled 2014-11-12: qty 2

## 2014-11-12 MED ORDER — ROCURONIUM BROMIDE 100 MG/10ML IV SOLN
INTRAVENOUS | Status: DC | PRN
  Administered 2014-11-12 (×2): 5 mg via INTRAVENOUS
  Administered 2014-11-12: 40 mg via INTRAVENOUS

## 2014-11-12 MED ORDER — MIDAZOLAM HCL 2 MG/2ML IJ SOLN
INTRAMUSCULAR | Status: AC
  Filled 2014-11-12: qty 4

## 2014-11-12 MED ORDER — PROPOFOL 10 MG/ML IV BOLUS
INTRAVENOUS | Status: DC | PRN
  Administered 2014-11-12: 150 mg via INTRAVENOUS

## 2014-11-12 MED ORDER — SODIUM CHLORIDE 0.9 % IJ SOLN
INTRAMUSCULAR | Status: DC | PRN
  Administered 2014-11-12: 3 mL

## 2014-11-12 MED ORDER — LACTATED RINGERS IR SOLN
Status: DC | PRN
  Administered 2014-11-12: 3000 mL

## 2014-11-12 MED ORDER — KETOROLAC TROMETHAMINE 30 MG/ML IJ SOLN
INTRAMUSCULAR | Status: DC | PRN
  Administered 2014-11-12: 30 mg via INTRAVENOUS

## 2014-11-12 MED ORDER — ONDANSETRON HCL 4 MG/2ML IJ SOLN
INTRAMUSCULAR | Status: DC | PRN
  Administered 2014-11-12: 4 mg via INTRAVENOUS

## 2014-11-12 MED ORDER — SCOPOLAMINE 1 MG/3DAYS TD PT72
1.0000 | MEDICATED_PATCH | Freq: Once | TRANSDERMAL | Status: DC
  Administered 2014-11-12: 1.5 mg via TRANSDERMAL

## 2014-11-12 MED ORDER — LIDOCAINE HCL 1 % IJ SOLN
INTRAMUSCULAR | Status: AC
  Filled 2014-11-12: qty 20

## 2014-11-12 MED ORDER — NEOSTIGMINE METHYLSULFATE 10 MG/10ML IV SOLN
INTRAVENOUS | Status: DC | PRN
  Administered 2014-11-12: 4 mg via INTRAVENOUS

## 2014-11-12 MED ORDER — LIDOCAINE HCL (CARDIAC) 20 MG/ML IV SOLN
INTRAVENOUS | Status: AC
  Filled 2014-11-12: qty 5

## 2014-11-12 MED ORDER — FENTANYL CITRATE (PF) 100 MCG/2ML IJ SOLN
INTRAMUSCULAR | Status: AC
  Filled 2014-11-12: qty 2

## 2014-11-12 MED ORDER — DEXAMETHASONE SODIUM PHOSPHATE 4 MG/ML IJ SOLN
INTRAMUSCULAR | Status: AC
  Filled 2014-11-12: qty 1

## 2014-11-12 MED ORDER — SCOPOLAMINE 1 MG/3DAYS TD PT72
MEDICATED_PATCH | TRANSDERMAL | Status: AC
  Administered 2014-11-12: 1.5 mg via TRANSDERMAL
  Filled 2014-11-12: qty 1

## 2014-11-12 MED ORDER — ROCURONIUM BROMIDE 100 MG/10ML IV SOLN
INTRAVENOUS | Status: AC
  Filled 2014-11-12: qty 1

## 2014-11-12 MED ORDER — ONDANSETRON HCL 4 MG/2ML IJ SOLN
INTRAMUSCULAR | Status: AC
  Filled 2014-11-12: qty 2

## 2014-11-12 MED ORDER — BUPIVACAINE HCL (PF) 0.25 % IJ SOLN
INTRAMUSCULAR | Status: AC
  Filled 2014-11-12: qty 30

## 2014-11-12 SURGICAL SUPPLY — 45 items
APL SKNCLS STERI-STRIP NONHPOA (GAUZE/BANDAGES/DRESSINGS) ×3
BENZOIN TINCTURE PRP APPL 2/3 (GAUZE/BANDAGES/DRESSINGS) ×3 IMPLANT
CABLE HIGH FREQUENCY MONO STRZ (ELECTRODE) ×3 IMPLANT
CANISTER SUCT 3000ML (MISCELLANEOUS) ×5 IMPLANT
CATH ROBINSON RED A/P 16FR (CATHETERS) ×5 IMPLANT
CLOSURE WOUND 1/4X4 (GAUZE/BANDAGES/DRESSINGS) ×1
CLOTH BEACON ORANGE TIMEOUT ST (SAFETY) ×5 IMPLANT
CONTAINER PREFILL 10% NBF 60ML (FORM) ×3 IMPLANT
DECANTER SPIKE VIAL GLASS SM (MISCELLANEOUS) ×3 IMPLANT
DRSG COVADERM PLUS 2X2 (GAUZE/BANDAGES/DRESSINGS) ×7 IMPLANT
DRSG OPSITE POSTOP 3X4 (GAUZE/BANDAGES/DRESSINGS) ×3 IMPLANT
GLOVE BIO SURGEON STRL SZ7 (GLOVE) ×5 IMPLANT
GLOVE SURG SS PI 7.0 STRL IVOR (GLOVE) ×24 IMPLANT
GOWN STRL REUS W/TWL LRG LVL3 (GOWN DISPOSABLE) ×15 IMPLANT
LIQUID BAND (GAUZE/BANDAGES/DRESSINGS) ×5 IMPLANT
NEEDLE HYPO 22GX1.5 SAFETY (NEEDLE) ×3 IMPLANT
NEEDLE INSUFFLATION 120MM (ENDOMECHANICALS) ×5 IMPLANT
NS IRRIG 1000ML POUR BTL (IV SOLUTION) ×5 IMPLANT
PACK ABDOMINAL GYN (CUSTOM PROCEDURE TRAY) ×5 IMPLANT
PACK LAPAROSCOPY BASIN (CUSTOM PROCEDURE TRAY) ×5 IMPLANT
PAD OB MATERNITY 4.3X12.25 (PERSONAL CARE ITEMS) ×5 IMPLANT
PAD POSITIONING PINK XL (MISCELLANEOUS) ×5 IMPLANT
RINGERS IRRIG 1000ML POUR BTL (IV SOLUTION) ×5 IMPLANT
SEALER TISSUE RND STRG TIP 45C (ENDOMECHANICALS) ×3 IMPLANT
SET IRRIG TUBING LAPAROSCOPIC (IRRIGATION / IRRIGATOR) ×3 IMPLANT
SPONGE GAUZE 4X4 12PLY STER LF (GAUZE/BANDAGES/DRESSINGS) ×10 IMPLANT
SPONGE LAP 18X18 X RAY DECT (DISPOSABLE) ×5 IMPLANT
STAPLER VISISTAT 35W (STAPLE) ×5 IMPLANT
STRIP CLOSURE SKIN 1/4X4 (GAUZE/BANDAGES/DRESSINGS) ×4 IMPLANT
SUT CHROMIC 0 CT 1 (SUTURE) ×5 IMPLANT
SUT PDS AB 0 CT1 27 (SUTURE) ×4 IMPLANT
SUT VIC AB 0 CT1 18XCR BRD8 (SUTURE) ×2 IMPLANT
SUT VIC AB 0 CT1 8-18 (SUTURE)
SUT VIC AB 3-0 CT1 27 (SUTURE)
SUT VIC AB 3-0 CT1 TAPERPNT 27 (SUTURE) ×2 IMPLANT
SUT VICRYL 0 TIES 12 18 (SUTURE) ×2 IMPLANT
SUT VICRYL 0 UR6 27IN ABS (SUTURE) ×3 IMPLANT
SUT VICRYL RAPIDE 4/0 PS 2 (SUTURE) ×7 IMPLANT
SYR CONTROL 10ML LL (SYRINGE) ×3 IMPLANT
TOWEL OR 17X24 6PK STRL BLUE (TOWEL DISPOSABLE) ×13 IMPLANT
TRAY FOLEY CATH SILVER 14FR (SET/KITS/TRAYS/PACK) ×5 IMPLANT
TROCAR OPTI TIP 5M 100M (ENDOMECHANICALS) ×10 IMPLANT
TROCAR XCEL DIL TIP R 11M (ENDOMECHANICALS) ×5 IMPLANT
WARMER LAPAROSCOPE (MISCELLANEOUS) ×5 IMPLANT
WATER STERILE IRR 1000ML POUR (IV SOLUTION) ×5 IMPLANT

## 2014-11-12 NOTE — Anesthesia Procedure Notes (Signed)
Procedure Name: Intubation Date/Time: 11/12/2014 7:27 AM Performed by: Elenore Paddy Pre-anesthesia Checklist: Patient identified, Emergency Drugs available, Suction available, Patient being monitored and Timeout performed Patient Re-evaluated:Patient Re-evaluated prior to inductionOxygen Delivery Method: Circle system utilized Preoxygenation: Pre-oxygenation with 100% oxygen Intubation Type: IV induction Ventilation: Mask ventilation without difficulty Laryngoscope Size: Mac and 3 Grade View: Grade II Tube type: Oral Tube size: 7.0 mm Number of attempts: 1 Airway Equipment and Method: Stylet Placement Confirmation: ETT inserted through vocal cords under direct vision,  positive ETCO2 and breath sounds checked- equal and bilateral Secured at: 21 cm Tube secured with: Tape Dental Injury: Teeth and Oropharynx as per pre-operative assessment

## 2014-11-12 NOTE — Discharge Instructions (Signed)

## 2014-11-12 NOTE — Progress Notes (Signed)
The patient was re-examined with no change in status 

## 2014-11-12 NOTE — Anesthesia Postprocedure Evaluation (Signed)
  Anesthesia Post-op Note  Patient: Crystal Tucker  Procedure(s) Performed: Procedure(s): LAPAROSCOPY DIAGNOSTIC (N/A) SALPINGO OOPHORECTOMY (Right)  Patient Location: PACU  Anesthesia Type:General  Level of Consciousness: awake, alert  and oriented  Airway and Oxygen Therapy: Patient Spontanous Breathing  Post-op Pain: mild  Post-op Assessment: Post-op Vital signs reviewed, Patient's Cardiovascular Status Stable, Respiratory Function Stable, Patent Airway, No signs of Nausea or vomiting and Pain level controlled              Post-op Vital Signs: Reviewed and stable  Last Vitals:  Filed Vitals:   11/12/14 1015  BP:   Pulse: 59  Temp:   Resp: 14    Complications: No apparent anesthesia complications

## 2014-11-12 NOTE — Transfer of Care (Signed)
Immediate Anesthesia Transfer of Care Note  Patient: Crystal Tucker  Procedure(s) Performed: Procedure(s): LAPAROSCOPY DIAGNOSTIC (N/A) SALPINGO OOPHORECTOMY (Right)  Patient Location: PACU  Anesthesia Type:General  Level of Consciousness: awake, alert  and oriented  Airway & Oxygen Therapy: Patient Spontanous Breathing and Patient connected to nasal cannula oxygen  Post-op Assessment: Report given to RN and Post -op Vital signs reviewed and stable  Post vital signs: Reviewed and stable  Last Vitals:  Filed Vitals:   11/12/14 0607  BP: 153/81  Pulse: 55  Temp: 36.5 C  Resp: 18    Complications: No apparent anesthesia complications

## 2014-11-12 NOTE — Op Note (Signed)
Preoperative diagnosis: Persistent right ovarian cyst, pelvic pain  Postoperative diagnosis: Same plus pelvic adhesions  Procedure: Laparoscopy with lysis of adhesions, RSO  Surgeon: Matthew Saras  Anesthesia: Gen.  EBL: Less than 10 cc  Procedure and findings:  The patient taken the operating room after an adequate level of general anesthesia was obtained with the legs in stirrups the abdomen perineum and vagina were prepped, Foley catheter positioned, sponge on a ring forcep placed in the vagina for manipulation. Appropriate timeouts were taken. Attention directed to the abdomen which was infiltrated with quarter percent Marcaine plain small incision was made in the varies needle was introduced that difficulty. Its intra-abdominal position was verified by pressure water testing. After 2-1/2 L pneumoperitoneum syncopated, lap scopic trocar and sleeve were then introduced that difficulty. There was no evidence of any bleeding or trauma. 3 finger breaths above the symphysis in the midline a 5 mm trocar was inserted under direct visualization. The patient was then placed in Trendelenburg and the pelvic findings as follows:  The uterus is surgically absent there was a 5 cm translucent thin-walled right ovarian cyst that was mobile. The left tube and every were small and did have some moderate adhesions behind the area the colon making it difficult to elevate. Upper abdomen unremarkable.  Also, the right round ligament was adherent to the anterior abdominal wall. The Enseal device initially was used to coagulate and divide the round ligament adhesions to the anterior abdominal wall once this was released the right tube and ovary could be elevated and placed on traction toward the midline. Part of the cyst was coagulated small incision was made and was aspirated to deflated to allow for better manipulation. This was all clear fluid. The pelvic sidewall was unremarkable the course of the ureter was well below,  the Enseal device was then used to coagulate and divide the right IP ligament completely excised in the right tube and ovary. The lower trocar was removed the lower incision was enlarged slightly to allow removal of the specimen which was sent to pathology. Pneumoperitoneum was then reestablished and the operative site was inspected was hemostatic. Its was removed, gas allowed to escape, the lower fascial defect closed with a 20 Vicryls running suture. 4-0 Monocryl subcuticular closure at each skin site. She tolerated this well went to recovery room in good condition.  Dictated with [place].

## 2014-11-13 ENCOUNTER — Encounter (HOSPITAL_COMMUNITY): Payer: Self-pay | Admitting: Obstetrics and Gynecology

## 2015-01-27 ENCOUNTER — Other Ambulatory Visit: Payer: Self-pay | Admitting: Family Medicine

## 2015-04-03 ENCOUNTER — Encounter: Payer: Self-pay | Admitting: Gastroenterology

## 2015-04-15 ENCOUNTER — Encounter: Payer: Self-pay | Admitting: Family Medicine

## 2015-05-16 ENCOUNTER — Encounter: Payer: Self-pay | Admitting: Gastroenterology

## 2015-06-02 ENCOUNTER — Other Ambulatory Visit: Payer: Self-pay | Admitting: Family Medicine

## 2015-06-02 NOTE — Telephone Encounter (Signed)
Please schedule a may f/u and refill until then  

## 2015-06-02 NOTE — Telephone Encounter (Signed)
No recent appt or labs, last lab was done on 11/07/15 and K was low then and pt has no future appt, please advise

## 2015-06-03 NOTE — Telephone Encounter (Signed)
Pt needed a CPE for her insurance, appt scheduled and med refilled

## 2015-07-11 ENCOUNTER — Ambulatory Visit (AMBULATORY_SURGERY_CENTER): Payer: Self-pay | Admitting: *Deleted

## 2015-07-11 VITALS — Ht 67.0 in | Wt 218.0 lb

## 2015-07-11 DIAGNOSIS — Z86010 Personal history of colonic polyps: Secondary | ICD-10-CM

## 2015-07-11 MED ORDER — NA SULFATE-K SULFATE-MG SULF 17.5-3.13-1.6 GM/177ML PO SOLN: 1.0000 | Freq: Once | ORAL | Status: DC

## 2015-07-11 NOTE — Progress Notes (Signed)
No egg or soy allergy known to patient  No issues with past sedation with any surgeries  or procedures, no intubation problems  No diet pills per patient No home 02 use per patient  No blood thinners per patient  Pt denies issues with constipation   

## 2015-07-14 ENCOUNTER — Encounter: Payer: Self-pay | Admitting: Gastroenterology

## 2015-07-25 ENCOUNTER — Encounter: Payer: Self-pay | Admitting: Gastroenterology

## 2015-07-25 ENCOUNTER — Ambulatory Visit (AMBULATORY_SURGERY_CENTER): Payer: Commercial Managed Care - PPO | Admitting: Gastroenterology

## 2015-07-25 VITALS — BP 142/80 | HR 61 | Temp 97.8°F | Resp 13 | Ht 67.0 in | Wt 218.0 lb

## 2015-07-25 DIAGNOSIS — D120 Benign neoplasm of cecum: Secondary | ICD-10-CM

## 2015-07-25 DIAGNOSIS — Z86010 Personal history of colonic polyps: Secondary | ICD-10-CM | POA: Diagnosis present

## 2015-07-25 MED ORDER — SODIUM CHLORIDE 0.9 % IV SOLN: 500.0000 mL | INTRAVENOUS | Status: DC

## 2015-07-25 NOTE — Progress Notes (Signed)
A and Ox 3 Report to RN 

## 2015-07-25 NOTE — Progress Notes (Signed)
Called to room to assist during endoscopic procedure.  Patient ID and intended procedure confirmed with present staff. Received instructions for my participation in the procedure from the performing physician.  

## 2015-07-25 NOTE — Patient Instructions (Addendum)

## 2015-07-25 NOTE — Op Note (Signed)
[place] Patient Name: Crystal Tucker Procedure Date: 07/25/2015 10:12 AM MRN: [id number] Endoscopist: Mauri Pole , MD Age: 55 Referring MD:  Date of Birth: 1960-04-16 Gender: Female Procedure:                Colonoscopy Indications:              Surveillance: Personal history of adenomatous                            polyps on last colonoscopy 5 years ago Medicines:                Monitored Anesthesia Care Procedure:                Pre-Anesthesia Assessment:                           - Prior to the procedure, a History and Physical                            was performed, and patient medications and                            allergies were reviewed. The patient's tolerance of                            previous anesthesia was also reviewed. The risks                            and benefits of the procedure and the sedation                            options and risks were discussed with the patient.                            All questions were answered, and informed consent                            was obtained. Prior Anticoagulants: The patient has                            taken no previous anticoagulant or antiplatelet                            agents. ASA Grade Assessment: II - A patient with                            mild systemic disease. After reviewing the risks                            and benefits, the patient was deemed in                            satisfactory condition to undergo the procedure.  After obtaining informed consent, the colonoscope                            was passed under direct vision. Throughout the                            procedure, the patient's blood pressure, pulse, and                            oxygen saturations were monitored continuously. The                            Model CF-HQ190L [id number]) scope was introduced                            through the anus and advanced to the the  terminal                            ileum, with identification of the appendiceal                            orifice and IC valve. The colonoscopy was performed                            without difficulty. The patient tolerated the                            procedure well. The quality of the bowel                            preparation was good. The terminal ileum, ileocecal                            valve, appendiceal orifice, and rectum were                            photographed. Scope In: 10:18:47 AM Scope Out: 10:34:12 AM Scope Withdrawal Time: 0 hours 10 minutes 21 seconds  Total Procedure Duration: 0 hours 15 minutes 25 seconds  Findings:                 The perianal and digital rectal examinations were                            normal.                           A 6 mm polyp was found in the cecum. The polyp was                            sessile. The polyp was removed with a cold snare.                            Resection and retrieval were complete.  Multiple small and large-mouthed diverticula were                            found in the sigmoid colon, descending colon,                            transverse colon, ascending colon and cecum.                           Non-bleeding internal hemorrhoids were found during                            retroflexion. The hemorrhoids were small. Complications:            No immediate complications. Estimated Blood Loss:     Estimated blood loss: none. Impression:               - One 6 mm polyp in the cecum, removed with a cold                            snare. Resected and retrieved.                           - Diverticulosis in the sigmoid colon, in the                            descending colon, in the transverse colon, in the                            ascending colon and in the cecum.                           - Non-bleeding internal hemorrhoids. Recommendation:           - Patient has a contact  number available for                            emergencies. The signs and symptoms of potential                            delayed complications were discussed with the                            patient. Return to normal activities tomorrow.                            Written discharge instructions were provided to the                            patient.                           - Resume previous diet.                           - Continue present medications.                           -  Await pathology results.                           - Repeat colonoscopy in 5 years for surveillance.                           - Return to GI clinic PRN. Mauri Pole, MD 07/25/2015 10:41:51 AM This report has been signed electronically.

## 2015-07-28 ENCOUNTER — Telehealth: Payer: Self-pay | Admitting: *Deleted

## 2015-07-28 ENCOUNTER — Other Ambulatory Visit: Payer: Self-pay | Admitting: Family Medicine

## 2015-07-28 NOTE — Telephone Encounter (Signed)
  Follow up Call-  Call back number 07/25/2015  Post procedure Call Back phone  # [phone]  Permission to leave phone message Yes     Patient questions:  Do you have a fever, pain , or abdominal swelling? No. Pain Score  0 *  Have you tolerated food without any problems? Yes.    Have you been able to return to your normal activities? Yes.    Do you have any questions about your discharge instructions: Diet   No. Medications  No. Follow up visit  No.  Do you have questions or concerns about your Care? No.  Actions: * If pain score is 4 or above: No action needed, pain <4.

## 2015-07-30 ENCOUNTER — Encounter: Payer: Self-pay | Admitting: Gastroenterology

## 2015-08-27 ENCOUNTER — Other Ambulatory Visit: Payer: Self-pay | Admitting: Family Medicine

## 2015-09-26 ENCOUNTER — Telehealth: Payer: Self-pay | Admitting: Family Medicine

## 2015-09-26 DIAGNOSIS — Z0000 Encounter for general adult medical examination without abnormal findings: Secondary | ICD-10-CM

## 2015-09-26 NOTE — Telephone Encounter (Signed)
-----   Message from Ellamae Sia sent at 09/24/2015  3:07 PM EDT ----- Regarding: Lab orders for Thursday, 7.27.17 Patient is scheduled for CPX labs, please order future labs, Thanks , Karna Christmas

## 2015-10-02 ENCOUNTER — Other Ambulatory Visit (INDEPENDENT_AMBULATORY_CARE_PROVIDER_SITE_OTHER): Payer: Commercial Managed Care - PPO

## 2015-10-02 DIAGNOSIS — Z0000 Encounter for general adult medical examination without abnormal findings: Secondary | ICD-10-CM

## 2015-10-02 LAB — CBC WITH DIFFERENTIAL/PLATELET
Basophils Absolute: 0 10*3/uL (ref 0.0–0.1)
Basophils Relative: 0.9 % (ref 0.0–3.0)
Eosinophils Absolute: 0.1 10*3/uL (ref 0.0–0.7)
Eosinophils Relative: 1.8 % (ref 0.0–5.0)
HCT: 36.6 % (ref 36.0–46.0)
Hemoglobin: 11.9 g/dL — ABNORMAL LOW (ref 12.0–15.0)
Lymphocytes Relative: 48.7 % — ABNORMAL HIGH (ref 12.0–46.0)
Lymphs Abs: 2.6 10*3/uL (ref 0.7–4.0)
MCHC: 32.5 g/dL (ref 30.0–36.0)
MCV: 82.2 fl (ref 78.0–100.0)
Monocytes Absolute: 0.3 10*3/uL (ref 0.1–1.0)
Monocytes Relative: 6.6 % (ref 3.0–12.0)
Neutro Abs: 2.2 10*3/uL (ref 1.4–7.7)
Neutrophils Relative %: 42 % — ABNORMAL LOW (ref 43.0–77.0)
Platelets: 262 10*3/uL (ref 150.0–400.0)
RBC: 4.45 Mil/uL (ref 3.87–5.11)
RDW: 13.1 % (ref 11.5–15.5)
WBC: 5.2 10*3/uL (ref 4.0–10.5)

## 2015-10-02 LAB — COMPREHENSIVE METABOLIC PANEL
ALT: 15 U/L (ref 0–35)
AST: 14 U/L (ref 0–37)
Albumin: 3.9 g/dL (ref 3.5–5.2)
Alkaline Phosphatase: 54 U/L (ref 39–117)
BUN: 13 mg/dL (ref 6–23)
CO2: 30 mEq/L (ref 19–32)
Calcium: 9.2 mg/dL (ref 8.4–10.5)
Chloride: 106 mEq/L (ref 96–112)
Creatinine, Ser: 0.74 mg/dL (ref 0.40–1.20)
GFR: 104.6 mL/min (ref >60.00)
Glucose, Bld: 98 mg/dL (ref 70–99)
Potassium: 3.6 mEq/L (ref 3.5–5.1)
Sodium: 142 mEq/L (ref 135–145)
Total Bilirubin: 0.4 mg/dL (ref 0.2–1.2)
Total Protein: 6.7 g/dL (ref 6.0–8.3)

## 2015-10-02 LAB — LIPID PANEL
Cholesterol: 208 mg/dL — ABNORMAL HIGH (ref 0–200)
HDL: 62.8 mg/dL (ref >39.00)
LDL Cholesterol: 131 mg/dL — ABNORMAL HIGH (ref 0–99)
NONHDL: 144.91
Total CHOL/HDL Ratio: 3
Triglycerides: 71 mg/dL (ref 0.0–149.0)
VLDL: 14.2 mg/dL (ref 0.0–40.0)

## 2015-10-02 LAB — TSH: TSH: 0.97 u[IU]/mL (ref 0.35–4.50)

## 2015-10-07 ENCOUNTER — Encounter: Payer: Commercial Managed Care - PPO | Admitting: Family Medicine

## 2015-10-09 ENCOUNTER — Encounter (HOSPITAL_COMMUNITY): Payer: Self-pay | Admitting: *Deleted

## 2015-10-09 DIAGNOSIS — R1032 Left lower quadrant pain: Secondary | ICD-10-CM | POA: Diagnosis not present

## 2015-10-09 DIAGNOSIS — Z87891 Personal history of nicotine dependence: Secondary | ICD-10-CM | POA: Diagnosis not present

## 2015-10-09 DIAGNOSIS — I10 Essential (primary) hypertension: Secondary | ICD-10-CM | POA: Diagnosis not present

## 2015-10-09 DIAGNOSIS — A5901 Trichomonal vulvovaginitis: Secondary | ICD-10-CM | POA: Diagnosis not present

## 2015-10-09 DIAGNOSIS — R109 Unspecified abdominal pain: Secondary | ICD-10-CM | POA: Diagnosis present

## 2015-10-09 DIAGNOSIS — R102 Pelvic and perineal pain: Secondary | ICD-10-CM | POA: Insufficient documentation

## 2015-10-09 LAB — COMPREHENSIVE METABOLIC PANEL
ALT: 18 U/L (ref 14–54)
AST: 21 U/L (ref 15–41)
Albumin: 4 g/dL (ref 3.5–5.0)
Alkaline Phosphatase: 59 U/L (ref 38–126)
Anion gap: 7 (ref 5–15)
BUN: 12 mg/dL (ref 6–20)
CHLORIDE: 104 mmol/L (ref 101–111)
CO2: 27 mmol/L (ref 22–32)
CREATININE: 0.79 mg/dL (ref 0.44–1.00)
Calcium: 9.4 mg/dL (ref 8.9–10.3)
GFR calc non Af Amer: >60 mL/min (ref >60)
Glucose, Bld: 128 mg/dL — ABNORMAL HIGH (ref 65–99)
POTASSIUM: 3.6 mmol/L (ref 3.5–5.1)
SODIUM: 138 mmol/L (ref 135–145)
Total Bilirubin: 0.2 mg/dL — ABNORMAL LOW (ref 0.3–1.2)
Total Protein: 6.6 g/dL (ref 6.5–8.1)

## 2015-10-09 LAB — CBC
HEMATOCRIT: 37.3 % (ref 36.0–46.0)
HEMOGLOBIN: 11.8 g/dL — AB (ref 12.0–15.0)
MCH: 26.3 pg (ref 26.0–34.0)
MCHC: 31.6 g/dL (ref 30.0–36.0)
MCV: 83.1 fL (ref 78.0–100.0)
PLATELETS: 281 10*3/uL (ref 150–400)
RBC: 4.49 MIL/uL (ref 3.87–5.11)
RDW: 13.2 % (ref 11.5–15.5)
WBC: 6 10*3/uL (ref 4.0–10.5)

## 2015-10-09 LAB — URINALYSIS, ROUTINE W REFLEX MICROSCOPIC
Bilirubin Urine: NEGATIVE
GLUCOSE, UA: NEGATIVE mg/dL
HGB URINE DIPSTICK: NEGATIVE
Ketones, ur: NEGATIVE mg/dL
Nitrite: NEGATIVE
PH: 5.5 (ref 5.0–8.0)
Protein, ur: NEGATIVE mg/dL
SPECIFIC GRAVITY, URINE: 1.03 (ref 1.005–1.030)

## 2015-10-09 LAB — URINE MICROSCOPIC-ADD ON: RBC / HPF: NONE SEEN RBC/hpf (ref 0–5)

## 2015-10-09 LAB — LIPASE, BLOOD: LIPASE: 22 U/L (ref 11–51)

## 2015-10-09 NOTE — ED Triage Notes (Signed)
Pt c/o left sided abd pain that radiates to her back. States it started Tuesday and has continued to get worse. States she also feels bloated.

## 2015-10-10 ENCOUNTER — Emergency Department (HOSPITAL_COMMUNITY): Payer: Commercial Managed Care - PPO

## 2015-10-10 ENCOUNTER — Emergency Department (HOSPITAL_COMMUNITY)
Admission: EM | Admit: 2015-10-10 | Discharge: 2015-10-10 | Disposition: A | Discharge status: Home / Self Care | Payer: Commercial Managed Care - PPO | Attending: Emergency Medicine | Admitting: Emergency Medicine

## 2015-10-10 DIAGNOSIS — R102 Pelvic and perineal pain: Secondary | ICD-10-CM

## 2015-10-10 DIAGNOSIS — R1032 Left lower quadrant pain: Secondary | ICD-10-CM

## 2015-10-10 DIAGNOSIS — A5901 Trichomonal vulvovaginitis: Secondary | ICD-10-CM

## 2015-10-10 DIAGNOSIS — R52 Pain, unspecified: Secondary | ICD-10-CM

## 2015-10-10 LAB — GC/CHLAMYDIA PROBE AMP (~~LOC~~) NOT AT ARMC
Chlamydia: NEGATIVE
NEISSERIA GONORRHEA: NEGATIVE

## 2015-10-10 LAB — WET PREP, GENITAL
CLUE CELLS WET PREP: NONE SEEN
Sperm: NONE SEEN
Trich, Wet Prep: NONE SEEN
Yeast Wet Prep HPF POC: NONE SEEN

## 2015-10-10 LAB — PREGNANCY, URINE: PREG TEST UR: NEGATIVE

## 2015-10-10 MED ORDER — OXYCODONE-ACETAMINOPHEN 5-325 MG PO TABS
1.0000 | ORAL_TABLET | Freq: Once | ORAL | Status: AC
  Administered 2015-10-10: 1 via ORAL
  Filled 2015-10-10: qty 1

## 2015-10-10 MED ORDER — IOPAMIDOL (ISOVUE-300) INJECTION 61%
INTRAVENOUS | Status: AC
  Administered 2015-10-10: 100 mL
  Filled 2015-10-10: qty 100

## 2015-10-10 MED ORDER — ONDANSETRON HCL 4 MG PO TABS: 4.0000 mg | ORAL_TABLET | Freq: Four times a day (QID) | ORAL | 0 refills | Status: DC

## 2015-10-10 MED ORDER — SODIUM CHLORIDE 0.9 % IV BOLUS (SEPSIS)
1000.0000 mL | Freq: Once | INTRAVENOUS | Status: AC
  Administered 2015-10-10: 1000 mL via INTRAVENOUS

## 2015-10-10 MED ORDER — METRONIDAZOLE 500 MG PO TABS
2000.0000 mg | ORAL_TABLET | Freq: Once | ORAL | Status: AC
  Administered 2015-10-10: 2000 mg via ORAL
  Filled 2015-10-10: qty 4

## 2015-10-10 MED ORDER — ONDANSETRON HCL 4 MG/2ML IJ SOLN
4.0000 mg | Freq: Once | INTRAMUSCULAR | Status: AC
  Administered 2015-10-10: 4 mg via INTRAVENOUS
  Filled 2015-10-10: qty 2

## 2015-10-10 MED ORDER — MORPHINE SULFATE (PF) 4 MG/ML IV SOLN
4.0000 mg | Freq: Once | INTRAVENOUS | Status: AC
  Administered 2015-10-10: 4 mg via INTRAVENOUS
  Filled 2015-10-10: qty 1

## 2015-10-10 MED ORDER — NAPROXEN 500 MG PO TABS: 500.0000 mg | ORAL_TABLET | Freq: Two times a day (BID) | ORAL | 0 refills | Status: DC

## 2015-10-10 NOTE — ED Notes (Signed)
Pt taken to US

## 2015-10-10 NOTE — ED Notes (Signed)
Pt transported back to [facility] on stretcher with tech, tolerated well.

## 2015-10-10 NOTE — ED Provider Notes (Signed)
[facility] DEPT Provider Note   CSN: [id number] Arrival date & time: 10/09/15  2219  By signing my name below, I, Irene Pap, attest that this documentation has been prepared under the direction and in the presence of Ezequiel Essex, MD. Electronically Signed: Irene Pap, ED Scribe. 10/10/15. 2:23 AM.  First Provider Contact:  None    History   Chief Complaint Chief Complaint  Patient presents with  . Abdominal Pain    The history is provided by the patient. No language interpreter was used.  HPI Comments: Crystal Tucker is a 55 y.o. female with a hx of partial hysterectomy, GERD, HTN, and diverticulosis who presents to the Emergency Department complaining of gradually worsening, waxing and waning, left sided, dull abdominal pain that radiates to the left back onset 2 days ago. She states that the pain "feels like labor pains." Pt reports associated nausea, decreased appetite, and abdominal distention. She states that it has been hard for her to have a BM, but had one earlier today. She reports hx of similar pain that was diagnosed as diverticulitis. She has not taken anything for her symptoms. She denies hx of heart disease, fever, chills, vomiting, diarrhea, vaginal bleeding, vaginal discharge, or dysuria. Pt is allergic to lisinopril. Pt is not sexually active.   Past Medical History:  Diagnosis Date  . Allergy   . Anemia    NOS  . Arthritis   . Benign neoplasm of colon   . Cardiac valve prolapse    leaking valve with surgery 1998  . Contact with or exposure to venereal diseases   . Diverticulosis of colon (without mention of hemorrhage)   . Edema   . GERD (gastroesophageal reflux disease)   . H/O: pneumonia   . Hypertension   . Pneumonia   . Pruritus of genital organs   . Rash and other nonspecific skin eruption   . Sleep apnea   . Unspecified symptom associated with female genital organs     Patient Active Problem List   Diagnosis Date Noted  . Adnexal  cyst 07/30/2014  . Pelvic pain in female 07/16/2014  . Benign paroxysmal positional vertigo 02/20/2013  . Obesity 11/10/2011  . Routine general medical examination at a health care facility 11/01/2011  . Allergic rhinitis 07/16/2011  . OSA (obstructive sleep apnea) 07/16/2011  . DEPENDENT EDEMA, LEGS, BILATERAL 10/11/2008  . ANEMIA-NOS 10/26/2006  . Essential hypertension 10/26/2006  . GERD 10/26/2006  . DIVERTICULOSIS, COLON 10/26/2006  . COLONIC POLYPS, HX OF 10/26/2006    Past Surgical History:  Procedure Laterality Date  . [place]  . COLONOSCOPY  7/03   polyps and tics  . DIAGNOSTIC LAPAROSCOPY    . KNEE ARTHROSCOPY     right  . KNEE SURGERY  10/04  . LAPAROSCOPY N/A 11/12/2014   Procedure: LAPAROSCOPY DIAGNOSTIC;  Surgeon: Molli Posey, MD;  Location: [facility];  Service: Gynecology;  Laterality: N/A;  . PARTIAL HYSTERECTOMY    . POLYPECTOMY    . RECTAL POLYPECTOMY  8/07  . SALPINGOOPHORECTOMY Right 11/12/2014   Procedure: SALPINGO OOPHORECTOMY;  Surgeon: Molli Posey, MD;  Location: [facility];  Service: Gynecology;  Laterality: Right;    OB History    Gravida Para Term Preterm AB Living   1 1 1     1    SAB TAB Ectopic Multiple Live Births                   Home Medications  Prior to Admission medications   Medication Sig Start Date End Date Taking? Authorizing Provider  Acetaminophen (TYLENOL ARTHRITIS EXT RELIEF PO) Take 2 tablets by mouth daily.    Historical Provider, MD  albuterol (PROVENTIL HFA;VENTOLIN HFA) 108 (90 BASE) MCG/ACT inhaler Inhale 2 puffs into the lungs every 6 (six) hours as needed for wheezing. Patient not taking: Reported on 07/11/2015 07/16/14 12/13/16  Abner Greenspan, MD  amLODipine (NORVASC) 5 MG tablet TAKE ONE TABLET BY MOUTH ONCE DAILY 07/29/15   Abner Greenspan, MD  diphenhydrAMINE (BENADRYL) 50 MG capsule Take 50 mg by mouth as needed. Reported on 07/25/2015    Historical Provider, MD  hydrochlorothiazide (HYDRODIURIL) 25  MG tablet TAKE ONE TABLET BY MOUTH ONCE DAILY Patient not taking: Reported on 07/11/2015 01/27/15   Abner Greenspan, MD  Inulin (FIBER CHOICE) 1.5 G CHEW Chew 1 tablet by mouth 2 (two) times daily.    Historical Provider, MD  KLOR-CON M20 20 MEQ tablet TAKE ONE TABLET BY MOUTH ONCE DAILY 06/03/15   Abner Greenspan, MD  omeprazole (PRILOSEC) 20 MG capsule TAKE ONE CAPSULE BY MOUTH ONCE DAILY 08/27/15   Abner Greenspan, MD  oxyCODONE-acetaminophen (PERCOCET/ROXICET) 5-325 MG per tablet Take 1 tablet by mouth every 4 (four) hours as needed for severe pain. Reported on 07/25/2015    Historical Provider, MD    Family History Family History  Problem Relation Age of Onset  . Heart disease Maternal Grandmother   . Cancer Paternal Aunt     type unknown  . Cerebral aneurysm Paternal Aunt   . Cerebral aneurysm Cousin   . Colon cancer Neg Hx   . Colon polyps Neg Hx   . Esophageal cancer Neg Hx   . Rectal cancer Neg Hx   . Stomach cancer Neg Hx     Social History Social History  Substance Use Topics  . Smoking status: Former Smoker    Packs/day: 0.80    Years: 30.00    Types: Cigarettes    Quit date: 03/01/2002  . Smokeless tobacco: Never Used  . Alcohol use 0.0 oz/week     Comment: rare     Allergies   Lisinopril and Other   Review of Systems Review of Systems  Constitutional: Positive for appetite change. Negative for chills and fever.  Gastrointestinal: Positive for abdominal distention, abdominal pain and nausea. Negative for diarrhea and vomiting.  Genitourinary: Negative for dysuria, vaginal bleeding and vaginal discharge.  All other systems reviewed and are negative.    Physical Exam Updated Vital Signs BP 171/79 (BP Location: Left Arm)   Pulse (!) 54   Temp 98.4 F (36.9 C) (Oral)   Resp 18   SpO2 100%   Physical Exam  Constitutional: She is oriented to person, place, and time. She appears well-developed and well-nourished. No distress.  uncomfortable  HENT:  Head:  Normocephalic and atraumatic.  Mouth/Throat: Oropharynx is clear and moist. No oropharyngeal exudate.  Eyes: Conjunctivae and EOM are normal. Pupils are equal, round, and reactive to light.  Neck: Normal range of motion. Neck supple.  No meningismus.  Cardiovascular: Normal rate, regular rhythm, normal heart sounds and intact distal pulses.   No murmur heard. Pulmonary/Chest: Effort normal and breath sounds normal. No respiratory distress.  Abdominal: Soft. There is tenderness in the suprapubic area and left lower quadrant. There is no rebound and no guarding.  Genitourinary:  Genitourinary Comments: Chaperone present. Normal external genitalia. Cervix absent. No vaginal discharge or bleeding. Left adnexal tenderness.  Musculoskeletal: Normal range of motion. She exhibits no edema or tenderness.  Left paraspinal lumbar tenderness  Neurological: She is alert and oriented to person, place, and time. No cranial nerve deficit. She exhibits normal muscle tone. Coordination normal.  No ataxia on finger to nose bilaterally. No pronator drift. 5/5 strength throughout. CN 2-12 intact.Equal grip strength. Sensation intact.   Skin: Skin is warm.  Psychiatric: She has a normal mood and affect. Her behavior is normal.  Nursing note and vitals reviewed.    ED Treatments / Results  DIAGNOSTIC STUDIES: Oxygen Saturation is 100% on RA, normal by my interpretation.    COORDINATION OF CARE: 2:21 AM-Discussed treatment plan which includes labs and CT scan with pt at bedside and pt agreed to plan.    Labs (all labs ordered are listed, but only abnormal results are displayed) Labs Reviewed  WET PREP, GENITAL - Abnormal; Notable for the following:       Result Value   WBC, Wet Prep HPF POC MANY (*)    All other components within normal limits  COMPREHENSIVE METABOLIC PANEL - Abnormal; Notable for the following:    Glucose, Bld 128 (*)    Total Bilirubin 0.2 (*)    All other components within normal  limits  CBC - Abnormal; Notable for the following:    Hemoglobin 11.8 (*)    All other components within normal limits  URINALYSIS, ROUTINE W REFLEX MICROSCOPIC (NOT AT [facility]) - Abnormal; Notable for the following:    APPearance CLOUDY (*)    Leukocytes, UA SMALL (*)    All other components within normal limits  URINE MICROSCOPIC-ADD ON - Abnormal; Notable for the following:    Squamous Epithelial / LPF 0-5 (*)    Bacteria, UA RARE (*)    All other components within normal limits  LIPASE, BLOOD  PREGNANCY, URINE  GC/CHLAMYDIA PROBE AMP ([place]) NOT AT [facility]    EKG  EKG Interpretation None       Radiology US Transvaginal Non-ob  Result Date: 10/10/2015 CLINICAL DATA:  Left pelvic pain since Tuesday, worsening. EXAM: TRANSABDOMINAL ULTRASOUND OF PELVIS DOPPLER ULTRASOUND OF OVARIES TECHNIQUE: Transabdominal ultrasound examination of the pelvis was performed including evaluation of the uterus, ovaries, adnexal regions, and pelvic cul-de-sac. Color and duplex Doppler ultrasound was utilized to evaluate blood flow to the ovaries. COMPARISON:  Abdominal CT from earlier today FINDINGS: Uterus Surgically absent.  Negative vaginal cuff Right ovary Surgically absent.  No right pelvic mass identified. Left ovary Measurements: 24 x 11 x 15 mm. Normal appearance/no adnexal mass. Pulsed Doppler evaluation demonstrates normal low-resistance arterial and venous waveforms in the remaining left ovary. IMPRESSION: 1. Normal left ovary. 2. Hysterectomy and right oophorectomy. Electronically Signed   By: Monte Fantasia M.D.   On: 10/10/2015 07:52   US Pelvis Complete  Result Date: 10/10/2015 CLINICAL DATA:  Left pelvic pain since Tuesday, worsening. EXAM: TRANSABDOMINAL ULTRASOUND OF PELVIS DOPPLER ULTRASOUND OF OVARIES TECHNIQUE: Transabdominal ultrasound examination of the pelvis was performed including evaluation of the uterus, ovaries, adnexal regions, and pelvic cul-de-sac. Color and duplex Doppler  ultrasound was utilized to evaluate blood flow to the ovaries. COMPARISON:  Abdominal CT from earlier today FINDINGS: Uterus Surgically absent.  Negative vaginal cuff Right ovary Surgically absent.  No right pelvic mass identified. Left ovary Measurements: 24 x 11 x 15 mm. Normal appearance/no adnexal mass. Pulsed Doppler evaluation demonstrates normal low-resistance arterial and venous waveforms in the remaining left ovary. IMPRESSION: 1. Normal left ovary. 2. Hysterectomy  and right oophorectomy. Electronically Signed   By: Monte Fantasia M.D.   On: 10/10/2015 07:52   Ct Abdomen Pelvis W Contrast  Result Date: 10/10/2015 CLINICAL DATA:  55 year old female with left-sided abdominal pain, and nausea. EXAM: CT ABDOMEN AND PELVIS WITH CONTRAST TECHNIQUE: Multidetector CT imaging of the abdomen and pelvis was performed using the standard protocol following bolus administration of intravenous contrast. CONTRAST:  181mL ISOVUE-300 IOPAMIDOL (ISOVUE-300) INJECTION 61% COMPARISON:  None. FINDINGS: Subsegmental atelectatic changes of the lung bases. There is mild cardiomegaly. No intra-abdominal free air or free fluid. Mild diffuse fatty infiltration of the liver. The gallbladder appears unremarkable. There is mild hazy appearance of the pancreas. Correlation with pancreatic enzymes recommended to exclude pancreatitis. The spleen, adrenal glands, kidneys, visualized ureters, and urinary bladder appear unremarkable. Hysterectomy. There is extensive sigmoid and colonic diverticulosis without active inflammatory changes. A 3 x 4 cm pocket containing air-fluid level in the left upper abdomen posterior to the stomach most likely represents a gastric diverticulum. A 2 cm diverticulum is also noted arising from the pylorus. There is no evidence of bowel obstruction or active inflammation. Normal appendix. There is moderate aortoiliac atherosclerotic disease. The origins of the celiac axis, SMA, IMA as well as the origins of the  renal arteries are patent. No portal venous gas identified. There is no adenopathy. There is a small fat containing umbilical hernia. Mild degenerative changes of the spine. No acute fracture. IMPRESSION: Mild hazy appearance of the pancreas. Correlation with pancreatic enzymes recommended to exclude pancreatitis. Extensive colonic diverticulosis as well as gastric diverticulum without active inflammatory changes. No bowel obstruction. Normal appendix. Electronically Signed   By: Anner Crete M.D.   On: 10/10/2015 05:45   [place] Art/ven Flow Abd Pelv Doppler  Result Date: 10/10/2015 CLINICAL DATA:  Left pelvic pain since Tuesday, worsening. EXAM: TRANSABDOMINAL ULTRASOUND OF PELVIS DOPPLER ULTRASOUND OF OVARIES TECHNIQUE: Transabdominal ultrasound examination of the pelvis was performed including evaluation of the uterus, ovaries, adnexal regions, and pelvic cul-de-sac. Color and duplex Doppler ultrasound was utilized to evaluate blood flow to the ovaries. COMPARISON:  Abdominal CT from earlier today FINDINGS: Uterus Surgically absent.  Negative vaginal cuff Right ovary Surgically absent.  No right pelvic mass identified. Left ovary Measurements: 24 x 11 x 15 mm. Normal appearance/no adnexal mass. Pulsed Doppler evaluation demonstrates normal low-resistance arterial and venous waveforms in the remaining left ovary. IMPRESSION: 1. Normal left ovary. 2. Hysterectomy and right oophorectomy. Electronically Signed   By: Monte Fantasia M.D.   On: 10/10/2015 07:52    Procedures Procedures (including critical care time)  Medications Ordered in ED Medications  sodium chloride 0.9 % bolus 1,000 mL (1,000 mLs Intravenous New Bag/Given 10/10/15 0250)  metroNIDAZOLE (FLAGYL) tablet 2,000 mg (not administered)  ondansetron (ZOFRAN) injection 4 mg (4 mg Intravenous Given 10/10/15 0249)  morphine 4 MG/ML injection 4 mg (4 mg Intravenous Given 10/10/15 0249)     Initial Impression / Assessment and Plan / ED Course  I  have reviewed the triage vital signs and the nursing notes.  Pertinent labs & imaging results that were available during my care of the patient were reviewed by me and considered in my medical decision making (see chart for details).  Clinical Course  2 days of left-sided lower abdominal pain similar to previous episodes of diverticulitis. Nausea but no vomiting. No fever. No diarrhea.  Labs are reassuring. Urinalysis is negative.  CT scan shows diverticulosis without evidence of diverticulitis. Lipase is normal. Doubt pancreatitis.  Patient still has ongoing left lower pelvic pain. Pelvic exam as above. Patient states she still has a left ovary. We'll evaluate with ultrasound.  Pelvic US shows normal L ovary. UNclear etiology of patient's pain.  NO acute pathology identified on imaging.  Has PCP followup on Monday . Trichomonas treated and patient informed. Return precautions discussed.  Final Clinical Impressions(s) / ED Diagnoses   Final diagnoses:  Pelvic pain in female  Left lower quadrant pain  Trichomonas vaginitis   I personally performed the services described in this documentation, which was scribed in my presence. The recorded information has been reviewed and is accurate.    New Prescriptions New Prescriptions   No medications on file     Ezequiel Essex, MD 10/10/15 [phone]

## 2015-10-13 ENCOUNTER — Ambulatory Visit (INDEPENDENT_AMBULATORY_CARE_PROVIDER_SITE_OTHER)
Admission: RE | Admit: 2015-10-13 | Discharge: 2015-10-13 | Disposition: A | Discharge status: Home / Self Care | Payer: Commercial Managed Care - PPO | Source: Ambulatory Visit | Attending: Family Medicine | Admitting: Family Medicine

## 2015-10-13 ENCOUNTER — Encounter: Payer: Self-pay | Admitting: Family Medicine

## 2015-10-13 ENCOUNTER — Telehealth: Payer: Self-pay | Admitting: *Deleted

## 2015-10-13 ENCOUNTER — Ambulatory Visit (INDEPENDENT_AMBULATORY_CARE_PROVIDER_SITE_OTHER): Payer: Commercial Managed Care - PPO | Admitting: Family Medicine

## 2015-10-13 VITALS — BP 126/78 | HR 68 | Temp 98.3°F | Ht 67.5 in | Wt 216.8 lb

## 2015-10-13 DIAGNOSIS — R109 Unspecified abdominal pain: Secondary | ICD-10-CM | POA: Diagnosis not present

## 2015-10-13 DIAGNOSIS — K5730 Diverticulosis of large intestine without perforation or abscess without bleeding: Secondary | ICD-10-CM

## 2015-10-13 LAB — POC URINALSYSI DIPSTICK (AUTOMATED)
Bilirubin, UA: 1
Glucose, UA: NEGATIVE
LEUKOCYTES UA: NEGATIVE
NITRITE UA: NEGATIVE
PH UA: 6
Protein, UA: 15
RBC UA: NEGATIVE
Spec Grav, UA: 1.025
Urobilinogen, UA: 0.2

## 2015-10-13 LAB — POCT UA - MICROSCOPIC ONLY

## 2015-10-13 MED ORDER — METRONIDAZOLE 500 MG PO TABS: 500.0000 mg | ORAL_TABLET | Freq: Three times a day (TID) | ORAL | 0 refills | Status: DC

## 2015-10-13 MED ORDER — CIPROFLOXACIN HCL 500 MG PO TABS
500.0000 mg | ORAL_TABLET | Freq: Two times a day (BID) | ORAL | 0 refills | Status: DC
Start: 2015-10-13 — End: 2015-10-28

## 2015-10-13 NOTE — Assessment & Plan Note (Addendum)
Reviewed ED notes/labs and studies in detail  Treated for trich  CT and [place] unrevealing  Symptoms seem like renal stone- uncomfortable and cannot stop moving  UA repeated today -neg  KUB today -pending  Has hydrocodone for her arm but she is treating constipation  She cannot work and take this  Diff incl new kidney stone (that started since ED), diverticulitis- (too early to see in ED) , or shingles with rash not yet starting  Less likely - ref pain from back (though pt states it goes from abd to back and not vice versa)

## 2015-10-13 NOTE — Telephone Encounter (Signed)
Pt notified of xray results and Dr. Marliss Coots comments. Rx sent to pharmacy and pt will keep [place] updated

## 2015-10-13 NOTE — Progress Notes (Signed)
Subjective:    Patient ID: Crystal Tucker, female    DOB: [birth year], 55 y.o.   MRN: [id number]  HPI Pt is here for a hospital f/u today  She was seen on 10/10/15 in the ED for abdominal pain - L sided (whole L side) - radiates to her back  With nausea/dec appetite and abd distension   Wet prep showed many wbc and per pt - trich- given flagyl for that and she took it   Lab Results  Component Value Date   WBC 6.0 10/09/2015   HGB 11.8 (L) 10/09/2015   HCT 37.3 10/09/2015   MCV 83.1 10/09/2015   PLT 281 10/09/2015      Chemistry      Component Value Date/Time   NA 138 10/09/2015 2238   K 3.6 10/09/2015 2238   CL 104 10/09/2015 2238   CO2 27 10/09/2015 2238   BUN 12 10/09/2015 2238   CREATININE 0.79 10/09/2015 2238      Component Value Date/Time   CALCIUM 9.4 10/09/2015 2238   ALKPHOS 59 10/09/2015 2238   AST 21 10/09/2015 2238   ALT 18 10/09/2015 2238   BILITOT 0.2 (L) 10/09/2015 2238      Lab Results  Component Value Date   LIPASE 22 10/09/2015     US Transvaginal Non-ob  Result Date: 10/10/2015 CLINICAL DATA:  Left pelvic pain since Tuesday, worsening. EXAM: TRANSABDOMINAL ULTRASOUND OF PELVIS DOPPLER ULTRASOUND OF OVARIES TECHNIQUE: Transabdominal ultrasound examination of the pelvis was performed including evaluation of the uterus, ovaries, adnexal regions, and pelvic cul-de-sac. Color and duplex Doppler ultrasound was utilized to evaluate blood flow to the ovaries. COMPARISON:  Abdominal CT from earlier today FINDINGS: Uterus Surgically absent.  Negative vaginal cuff Right ovary Surgically absent.  No right pelvic mass identified. Left ovary Measurements: 24 x 11 x 15 mm. Normal appearance/no adnexal mass. Pulsed Doppler evaluation demonstrates normal low-resistance arterial and venous waveforms in the remaining left ovary. IMPRESSION: 1. Normal left ovary. 2. Hysterectomy and right oophorectomy. Electronically Signed   By: Monte Fantasia M.D.   On: 10/10/2015 07:52     US Pelvis Complete  Result Date: 10/10/2015 CLINICAL DATA:  Left pelvic pain since Tuesday, worsening. EXAM: TRANSABDOMINAL ULTRASOUND OF PELVIS DOPPLER ULTRASOUND OF OVARIES TECHNIQUE: Transabdominal ultrasound examination of the pelvis was performed including evaluation of the uterus, ovaries, adnexal regions, and pelvic cul-de-sac. Color and duplex Doppler ultrasound was utilized to evaluate blood flow to the ovaries. COMPARISON:  Abdominal CT from earlier today FINDINGS: Uterus Surgically absent.  Negative vaginal cuff Right ovary Surgically absent.  No right pelvic mass identified. Left ovary Measurements: 24 x 11 x 15 mm. Normal appearance/no adnexal mass. Pulsed Doppler evaluation demonstrates normal low-resistance arterial and venous waveforms in the remaining left ovary. IMPRESSION: 1. Normal left ovary. 2. Hysterectomy and right oophorectomy. Electronically Signed   By: Monte Fantasia M.D.   On: 10/10/2015 07:52   Ct Abdomen Pelvis W Contrast  Result Date: 10/10/2015 CLINICAL DATA:  55 year old female with left-sided abdominal pain, and nausea. EXAM: CT ABDOMEN AND PELVIS WITH CONTRAST TECHNIQUE: Multidetector CT imaging of the abdomen and pelvis was performed using the standard protocol following bolus administration of intravenous contrast. CONTRAST:  156mL ISOVUE-300 IOPAMIDOL (ISOVUE-300) INJECTION 61% COMPARISON:  None. FINDINGS: Subsegmental atelectatic changes of the lung bases. There is mild cardiomegaly. No intra-abdominal free air or free fluid. Mild diffuse fatty infiltration of the liver. The gallbladder appears unremarkable. There is mild hazy appearance of the pancreas.  Correlation with pancreatic enzymes recommended to exclude pancreatitis. The spleen, adrenal glands, kidneys, visualized ureters, and urinary bladder appear unremarkable. Hysterectomy. There is extensive sigmoid and colonic diverticulosis without active inflammatory changes. A 3 x 4 cm pocket containing air-fluid  level in the left upper abdomen posterior to the stomach most likely represents a gastric diverticulum. A 2 cm diverticulum is also noted arising from the pylorus. There is no evidence of bowel obstruction or active inflammation. Normal appendix. There is moderate aortoiliac atherosclerotic disease. The origins of the celiac axis, SMA, IMA as well as the origins of the renal arteries are patent. No portal venous gas identified. There is no adenopathy. There is a small fat containing umbilical hernia. Mild degenerative changes of the spine. No acute fracture. IMPRESSION: Mild hazy appearance of the pancreas. Correlation with pancreatic enzymes recommended to exclude pancreatitis. Extensive colonic diverticulosis as well as gastric diverticulum without active inflammatory changes. No bowel obstruction. Normal appendix. Electronically Signed   By: Anner Crete M.D.   On: 10/10/2015 05:45   [place] Art/ven Flow Abd Pelv Doppler  Result Date: 10/10/2015 CLINICAL DATA:  Left pelvic pain since Tuesday, worsening. EXAM: TRANSABDOMINAL ULTRASOUND OF PELVIS DOPPLER ULTRASOUND OF OVARIES TECHNIQUE: Transabdominal ultrasound examination of the pelvis was performed including evaluation of the uterus, ovaries, adnexal regions, and pelvic cul-de-sac. Color and duplex Doppler ultrasound was utilized to evaluate blood flow to the ovaries. COMPARISON:  Abdominal CT from earlier today FINDINGS: Uterus Surgically absent.  Negative vaginal cuff Right ovary Surgically absent.  No right pelvic mass identified. Left ovary Measurements: 24 x 11 x 15 mm. Normal appearance/no adnexal mass. Pulsed Doppler evaluation demonstrates normal low-resistance arterial and venous waveforms in the remaining left ovary. IMPRESSION: 1. Normal left ovary. 2. Hysterectomy and right oophorectomy. Electronically Signed   By: Monte Fantasia M.D.   On: 10/10/2015 07:52    Hazy view of pancreas noted Diverticulosis w/o inflam changes noted Nl appendix    Neg gc/chlam tests Neg wet prep except for wbc Has had a partial hysterectomy  Wt Readings from Last 3 Encounters:  10/13/15 216 lb 12 oz (98.3 kg)  07/25/15 218 lb (98.9 kg)  07/11/15 218 lb (98.9 kg)   bmi is 33.4  Nl pap in 2007  Colonoscopy 5/17 -tics and one polyp   Pain is still there- feels like labor/contractions  Really uncomfortable  - she cannot stay still   Never had pain like this before She had taken a laxative- did not help pain but she did have a BM-- mag citrate   Skin feels numb in the area over the area of her pain -but no rash   Her back bothers her - but she feels like the pain is going from the front to the back and not vice versa   Also fell at work backwards in April- injured her arm-still in a sling - tore muscles in her shoulder  Is on hydrocodone - at night - for this - cannot take at work   UA today- protein/ketones and bilirubin  Results for orders placed or performed in visit on 10/13/15  POCT Urinalysis Dipstick (Automated)  Result Value Ref Range   Color, UA Yellow    Clarity, UA Clear    Glucose, UA Negative    Bilirubin, UA 1 mg/dL    Ketones, UA Trace    Spec Grav, UA 1.025    Blood, UA Negative    pH, UA 6.0    Protein, UA 15 mg/dL  Urobilinogen, UA 0.2    Nitrite, UA Negative    Leukocytes, UA Negative Negative   on micro- triple phosphate crystals seen  Not a lot of appetite- no imp or worsening symptoms with food  Is avoiding naproxen    Patient Active Problem List   Diagnosis Date Noted  . Left flank pain 10/13/2015  . Adnexal cyst 07/30/2014  . Pelvic pain in female 07/16/2014  . Benign paroxysmal positional vertigo 02/20/2013  . Obesity 11/10/2011  . Routine general medical examination at a health care facility 11/01/2011  . Allergic rhinitis 07/16/2011  . OSA (obstructive sleep apnea) 07/16/2011  . DEPENDENT EDEMA, LEGS, BILATERAL 10/11/2008  . ANEMIA-NOS 10/26/2006  . Essential hypertension 10/26/2006  .  GERD 10/26/2006  . DIVERTICULOSIS, COLON 10/26/2006  . COLONIC POLYPS, HX OF 10/26/2006   Past Medical History:  Diagnosis Date  . Allergy   . Anemia    NOS  . Arthritis   . Benign neoplasm of colon   . Cardiac valve prolapse    leaking valve with surgery 1998  . Contact with or exposure to venereal diseases   . Diverticulosis of colon (without mention of hemorrhage)   . Edema   . GERD (gastroesophageal reflux disease)   . H/O: pneumonia   . Hypertension   . Pneumonia   . Pruritus of genital organs   . Rash and other nonspecific skin eruption   . Sleep apnea   . Unspecified symptom associated with female genital organs    Past Surgical History:  Procedure Laterality Date  . [place]  . COLONOSCOPY  7/03   polyps and tics  . DIAGNOSTIC LAPAROSCOPY    . KNEE ARTHROSCOPY     right  . KNEE SURGERY  10/04  . LAPAROSCOPY N/A 11/12/2014   Procedure: LAPAROSCOPY DIAGNOSTIC;  Surgeon: Molli Posey, MD;  Location: [facility];  Service: Gynecology;  Laterality: N/A;  . PARTIAL HYSTERECTOMY    . POLYPECTOMY    . RECTAL POLYPECTOMY  8/07  . SALPINGOOPHORECTOMY Right 11/12/2014   Procedure: SALPINGO OOPHORECTOMY;  Surgeon: Molli Posey, MD;  Location: [facility];  Service: Gynecology;  Laterality: Right;   Social History  Substance Use Topics  . Smoking status: Former Smoker    Packs/day: 0.80    Years: 30.00    Types: Cigarettes    Quit date: 03/01/2002  . Smokeless tobacco: Never Used  . Alcohol use 0.0 oz/week     Comment: rare   Family History  Problem Relation Age of Onset  . Heart disease Maternal Grandmother   . Cancer Paternal Aunt     type unknown  . Cerebral aneurysm Paternal Aunt   . Cerebral aneurysm Cousin   . Colon cancer Neg Hx   . Colon polyps Neg Hx   . Esophageal cancer Neg Hx   . Rectal cancer Neg Hx   . Stomach cancer Neg Hx    Allergies  Allergen Reactions  . Lisinopril     cough  . Other Rash    All citrus- lemons, oranges,  grapefruit, watermelon, cantelope - all fruits with juices    Current Outpatient Prescriptions on File Prior to Visit  Medication Sig Dispense Refill  . Acetaminophen (TYLENOL ARTHRITIS EXT RELIEF PO) Take 2 tablets by mouth daily.    Marland Kitchen albuterol (PROVENTIL HFA;VENTOLIN HFA) 108 (90 BASE) MCG/ACT inhaler Inhale 2 puffs into the lungs every 6 (six) hours as needed for wheezing. 1 Inhaler 5  . amLODipine (NORVASC) 5 MG  tablet TAKE ONE TABLET BY MOUTH ONCE DAILY 30 tablet 2  . diphenhydrAMINE (BENADRYL) 50 MG capsule Take 50 mg by mouth every 8 (eight) hours as needed for allergies. Reported on 07/25/2015    . Inulin (FIBER CHOICE) 1.5 G CHEW Chew 1 tablet by mouth 2 (two) times daily.    Marland Kitchen KLOR-CON M20 20 MEQ tablet TAKE ONE TABLET BY MOUTH ONCE DAILY 90 tablet 1  . naproxen (NAPROSYN) 500 MG tablet Take 1 tablet (500 mg total) by mouth 2 (two) times daily. 30 tablet 0  . omeprazole (PRILOSEC) 20 MG capsule TAKE ONE CAPSULE BY MOUTH ONCE DAILY 90 capsule 0  . ondansetron (ZOFRAN) 4 MG tablet Take 1 tablet (4 mg total) by mouth every 6 (six) hours. 12 tablet 0  . oxyCODONE-acetaminophen (PERCOCET/ROXICET) 5-325 MG per tablet Take 1 tablet by mouth every 4 (four) hours as needed for severe pain. Reported on 07/25/2015     No current facility-administered medications on file prior to visit.     Review of Systems Review of Systems  Constitutional: Negative for fever, appetite change, fatigue and unexpected weight change.  Eyes: Negative for pain and visual disturbance.  Respiratory: Negative for cough and shortness of breath.   Cardiovascular: Negative for cp or palpitations    Gastrointestinal: Negative for nausea, diarrhea and constipation. pos for L flank pain  Genitourinary: Negative for urgency and frequency. (pos for frequency at night only) neg for blood in urine or dysuria Skin: Negative for pallor or rash   MSK pos for R shoulder pain  Neurological: Negative for weakness,  light-headedness, numbness and headaches.  Hematological: Negative for adenopathy. Does not bruise/bleed easily.  Psychiatric/Behavioral: Negative for dysphoric mood. The patient is not nervous/anxious.         Objective:   Physical Exam  Constitutional: She appears well-developed and well-nourished. No distress.  obese and well appearing - but uncomfortable - squirming and moving around due to discomfort  HENT:  Head: Normocephalic and atraumatic.  Mouth/Throat: Oropharynx is clear and moist.  Eyes: Conjunctivae and EOM are normal. Pupils are equal, round, and reactive to light. No scleral icterus.  Neck: Normal range of motion. Neck supple.  Cardiovascular: Normal rate, regular rhythm and normal heart sounds.   Pulmonary/Chest: Effort normal and breath sounds normal. No respiratory distress. She has no wheezes. She has no rales.  Abdominal: Soft. Bowel sounds are normal. She exhibits no shifting dullness, no distension, no pulsatile liver, no abdominal bruit, no ascites, no pulsatile midline mass and no mass. There is no hepatosplenomegaly. There is tenderness in the left upper quadrant and left lower quadrant. There is CVA tenderness. There is no rigidity, no rebound, no guarding, no tenderness at McBurney's point and negative Murphy's sign.  Pt states tenderness is not as bad as on the 4th   Musculoskeletal: She exhibits no edema.  Lymphadenopathy:    She has no cervical adenopathy.  Neurological: She is alert. No cranial nerve deficit. She exhibits normal muscle tone. Coordination normal.  Skin: Skin is warm and dry. No erythema. No pallor.  2 small papules seen in flank area - per pt not new at all / scars  Psychiatric: She has a normal mood and affect.          Assessment & Plan:   Problem List Items Addressed This Visit      Digestive   Diverticulosis of large intestine    Pt has L sided abd pain - no diverticulitis seen on CT  from 8/4 and nl wbc at that time - but 3 d  later symptoms persist  If KUB is normal -may consider empirically tx for this  ? If she may need GI consult         Other   Left flank pain - Primary    Reviewed ED notes/labs and studies in detail  Treated for trich  CT and [place] unrevealing  Symptoms seem like renal stone- uncomfortable and cannot stop moving  UA repeated today -neg  KUB today -pending  Has hydrocodone for her arm but she is treating constipation  She cannot work and take this  Diff incl new kidney stone (that started since ED), diverticulitis- (too early to see in ED) , or shingles with rash not yet starting  Less likely - ref pain from back (though pt states it goes from abd to back and not vice versa)        Relevant Orders   POCT Urinalysis Dipstick (Automated) (Completed)   DG Abd 1 View    Other Visit Diagnoses   None.

## 2015-10-13 NOTE — Progress Notes (Signed)
Pre visit review using our clinic review tool, if applicable. No additional management support is needed unless otherwise documented below in the visit note. 

## 2015-10-13 NOTE — Patient Instructions (Signed)
Let's do an xray of your abdomen today to look for any more signs of a kidney stone  Try not to get constipated -stool softener if needed  Urinalysis looks normal right now  We will get a result of the xray and get back to you today I may treat you empirically for diverticulitis  If you develop a rash please let me know asap  Stop up front to make an appt. For your physical

## 2015-10-13 NOTE — Assessment & Plan Note (Signed)
Pt has L sided abd pain - no diverticulitis seen on CT from 8/4 and nl wbc at that time - but 3 d later symptoms persist  If KUB is normal -may consider empirically tx for this  ? If she may need GI consult

## 2015-10-13 NOTE — Telephone Encounter (Signed)
-----   Message from Abner Greenspan, MD sent at 10/13/2015  1:16 PM EDT ----- Abdominal xray looks ok  No constipation or stool retention noted  There are some pelvic calcifications called phleboliths- not uncommon and would not cause pain  I did not see any blood cells in urine (micro) to indicate kidney stone I want to go ahead and treat her empirically for diverticulitis to see if that helps Please call in cipro 500 mg 1 po bid for 10 d #20 no ref Flagyl 500 mg 1 po tid for 10 d #30 no ref Update me if symptoms worsen or no improvement with this  Keep me posted

## 2015-10-28 ENCOUNTER — Encounter: Payer: Self-pay | Admitting: Family Medicine

## 2015-10-28 ENCOUNTER — Ambulatory Visit (INDEPENDENT_AMBULATORY_CARE_PROVIDER_SITE_OTHER): Payer: Commercial Managed Care - PPO | Admitting: Family Medicine

## 2015-10-28 VITALS — BP 128/82 | HR 60 | Temp 98.5°F | Ht 67.5 in | Wt 221.5 lb

## 2015-10-28 DIAGNOSIS — B370 Candidal stomatitis: Secondary | ICD-10-CM | POA: Insufficient documentation

## 2015-10-28 DIAGNOSIS — R195 Other fecal abnormalities: Secondary | ICD-10-CM | POA: Diagnosis not present

## 2015-10-28 DIAGNOSIS — B029 Zoster without complications: Secondary | ICD-10-CM | POA: Insufficient documentation

## 2015-10-28 MED ORDER — TRAMADOL HCL 50 MG PO TABS: 50.0000 mg | ORAL_TABLET | Freq: Three times a day (TID) | ORAL | 0 refills | Status: DC | PRN

## 2015-10-28 MED ORDER — NYSTATIN 100000 UNIT/ML MT SUSP: 5.0000 mL | Freq: Three times a day (TID) | OROMUCOSAL | 0 refills | Status: DC

## 2015-10-28 MED ORDER — AMLODIPINE BESYLATE 5 MG PO TABS: 5.0000 mg | ORAL_TABLET | Freq: Every day | ORAL | 3 refills | Status: DC

## 2015-10-28 NOTE — Progress Notes (Signed)
Subjective:    Patient ID: Crystal Tucker, female    DOB: 1960-07-02, 55 y.o.   MRN: [id number]  HPI Here with itchy bumps   ? Mosquito  Bites  They got bigger  Now around  Waist - itchy and more uncomfortable   More loose stool in the am (3 times) Thought due to antibiotic -finished abx   She did eat yogurt with active cultures   Was  On cipro and flagyl for poss diverticulitis Is  Drinking lots of water   Is fatigued - and sluggish and no appetite   Has  Coating on tongue -? Thrush   Patient Active Problem List   Diagnosis Date Noted  . Herpes zoster 10/28/2015  . Thrush 10/28/2015  . Loose stools 10/28/2015  . Left flank pain 10/13/2015  . Adnexal cyst 07/30/2014  . Pelvic pain in female 07/16/2014  . Benign paroxysmal positional vertigo 02/20/2013  . Obesity 11/10/2011  . Routine general medical examination at a health care facility 11/01/2011  . Allergic rhinitis 07/16/2011  . OSA (obstructive sleep apnea) 07/16/2011  . DEPENDENT EDEMA, LEGS, BILATERAL 10/11/2008  . ANEMIA-NOS 10/26/2006  . Essential hypertension 10/26/2006  . GERD 10/26/2006  . Diverticulosis of large intestine 10/26/2006  . COLONIC POLYPS, HX OF 10/26/2006   Past Medical History:  Diagnosis Date  . Allergy   . Anemia    NOS  . Arthritis   . Benign neoplasm of colon   . Cardiac valve prolapse    leaking valve with surgery 1998  . Contact with or exposure to venereal diseases   . Diverticulosis of colon (without mention of hemorrhage)   . Edema   . GERD (gastroesophageal reflux disease)   . H/O: pneumonia   . Hypertension   . Pneumonia   . Pruritus of genital organs   . Rash and other nonspecific skin eruption   . Sleep apnea   . Unspecified symptom associated with female genital organs    Past Surgical History:  Procedure Laterality Date  . [place]  . COLONOSCOPY  7/03   polyps and tics  . DIAGNOSTIC LAPAROSCOPY    . KNEE ARTHROSCOPY     right  .  KNEE SURGERY  10/04  . LAPAROSCOPY N/A 11/12/2014   Procedure: LAPAROSCOPY DIAGNOSTIC;  Surgeon: Molli Posey, MD;  Location: [facility];  Service: Gynecology;  Laterality: N/A;  . PARTIAL HYSTERECTOMY    . POLYPECTOMY    . RECTAL POLYPECTOMY  8/07  . SALPINGOOPHORECTOMY Right 11/12/2014   Procedure: SALPINGO OOPHORECTOMY;  Surgeon: Molli Posey, MD;  Location: [facility];  Service: Gynecology;  Laterality: Right;   Social History  Substance Use Topics  . Smoking status: Former Smoker    Packs/day: 0.80    Years: 30.00    Types: Cigarettes    Quit date: 03/01/2002  . Smokeless tobacco: Never Used  . Alcohol use 0.0 oz/week     Comment: rare   Family History  Problem Relation Age of Onset  . Heart disease Maternal Grandmother   . Cancer Paternal Aunt     type unknown  . Cerebral aneurysm Paternal Aunt   . Cerebral aneurysm Cousin   . Colon cancer Neg Hx   . Colon polyps Neg Hx   . Esophageal cancer Neg Hx   . Rectal cancer Neg Hx   . Stomach cancer Neg Hx    Allergies  Allergen Reactions  . Lisinopril     cough  . Other  Rash    All citrus- lemons, oranges, grapefruit, watermelon, cantelope - all fruits with juices    Current Outpatient Prescriptions on File Prior to Visit  Medication Sig Dispense Refill  . Acetaminophen (TYLENOL ARTHRITIS EXT RELIEF PO) Take 2 tablets by mouth daily.    Marland Kitchen albuterol (PROVENTIL HFA;VENTOLIN HFA) 108 (90 BASE) MCG/ACT inhaler Inhale 2 puffs into the lungs every 6 (six) hours as needed for wheezing. 1 Inhaler 5  . diphenhydrAMINE (BENADRYL) 50 MG capsule Take 50 mg by mouth every 8 (eight) hours as needed for allergies. Reported on 07/25/2015    . Inulin (FIBER CHOICE) 1.5 G CHEW Chew 1 tablet by mouth 2 (two) times daily.    Marland Kitchen KLOR-CON M20 20 MEQ tablet TAKE ONE TABLET BY MOUTH ONCE DAILY 90 tablet 1  . omeprazole (PRILOSEC) 20 MG capsule TAKE ONE CAPSULE BY MOUTH ONCE DAILY 90 capsule 0  . ondansetron (ZOFRAN) 4 MG tablet Take 1 tablet (4 mg  total) by mouth every 6 (six) hours. 12 tablet 0  . oxyCODONE-acetaminophen (PERCOCET/ROXICET) 5-325 MG per tablet Take 1 tablet by mouth every 4 (four) hours as needed for severe pain. Reported on 07/25/2015    . naproxen (NAPROSYN) 500 MG tablet Take 1 tablet (500 mg total) by mouth 2 (two) times daily. (Patient not taking: Reported on 10/28/2015) 30 tablet 0   No current facility-administered medications on file prior to visit.     Review of Systems Review of Systems  Constitutional: Negative for fever, appetite change, fatigue and unexpected weight change.  Eyes: Negative for pain and visual disturbance.  ENT pos for uncomfortable coating on tongue  Respiratory: Negative for cough and shortness of breath.   Cardiovascular: Negative for cp or palpitations    Gastrointestinal: Negative for nausea, diarrhea and constipation. pos for loose stool without blood (not liquid)   Genitourinary: Negative for urgency and frequency.  Skin: Negative for pallor , pos for L flank rash with pain  Neurological: Negative for weakness, light-headedness, numbness and headaches.  Hematological: Negative for adenopathy. Does not bruise/bleed easily.  Psychiatric/Behavioral: Negative for dysphoric mood. The patient is not nervous/anxious.         Objective:   Physical Exam  Constitutional: She appears well-developed and well-nourished. No distress.  obese and well appearing  Seems fatigued   HENT:  Head: Normocephalic and atraumatic.  White non scrapable coating noted on tongue   Eyes: Conjunctivae and EOM are normal. Pupils are equal, round, and reactive to light. Right eye exhibits no discharge. Left eye exhibits no discharge. No scleral icterus.  Neck: Normal range of motion. Neck supple.  Cardiovascular: Normal rate and regular rhythm.   Pulmonary/Chest: Effort normal and breath sounds normal.  Abdominal: Soft. Bowel sounds are normal. She exhibits no distension and no mass. There is tenderness.  There is no rebound and no guarding.  Musculoskeletal: She exhibits tenderness.  Lymphadenopathy:    She has no cervical adenopathy.  Skin: Skin is warm and dry. Rash noted. There is erythema.  Clusters of vesicles at various points over L T8-9 dermatomes - with one area on L abdomen  Tender Vesicles are drying up  No erythema  Resembles herpes zoster   Psychiatric: She has a normal mood and affect.          Assessment & Plan:   Problem List Items Addressed This Visit      Digestive   Thrush    S/p abx (cipro and flagyl)  Given nystatin solution to  swish and swallow  Update if not starting to improve in a week or if worsening        Relevant Medications   nystatin (MYCOSTATIN) 100000 UNIT/ML suspension     Other   Loose stools    Following abx- but not mucousy or liquid  Doubt c diff given above Recommend otc probiotic (Align) for 1-2 wk If worse/liquid stool or abd pain or fever- will alert [place]       Herpes zoster    Cluster of vesicles seen in several spots along L T8-9 dermatomes with tenderness  Most areas are resolving /vesicles are dry  Too late for valtrex  Given tramadol for pain to use with caution of sedation  Disc soap and water clean and light covering  Consider gabapentin if any post herpetic pain  Handout given        Relevant Medications   nystatin (MYCOSTATIN) 100000 UNIT/ML suspension    Other Visit Diagnoses   None.

## 2015-10-28 NOTE — Assessment & Plan Note (Signed)
Following abx- but not mucousy or liquid  Doubt c diff given above Recommend otc probiotic (Align) for 1-2 wk If worse/liquid stool or abd pain or fever- will alert [place]

## 2015-10-28 NOTE — Patient Instructions (Signed)
You are getting over shingles Use tramadol for pain as needed with caution of sedation  Keep rash clean with soap and water  Keep it lightly covered  For loose stool- get probiotic and take 1-2 weeks (Align is a good brand) Use the nystatin mouthwash for thrush

## 2015-10-28 NOTE — Assessment & Plan Note (Signed)
S/p abx (cipro and flagyl)  Given nystatin solution to swish and swallow  Update if not starting to improve in a week or if worsening

## 2015-10-28 NOTE — Progress Notes (Signed)
Pre visit review using our clinic review tool, if applicable. No additional management support is needed unless otherwise documented below in the visit note. 

## 2015-10-28 NOTE — Assessment & Plan Note (Signed)
Cluster of vesicles seen in several spots along L T8-9 dermatomes with tenderness  Most areas are resolving /vesicles are dry  Too late for valtrex  Given tramadol for pain to use with caution of sedation  Disc soap and water clean and light covering  Consider gabapentin if any post herpetic pain  Handout given

## 2015-11-03 ENCOUNTER — Ambulatory Visit (INDEPENDENT_AMBULATORY_CARE_PROVIDER_SITE_OTHER): Payer: Commercial Managed Care - PPO | Admitting: Family Medicine

## 2015-11-03 ENCOUNTER — Encounter: Payer: Self-pay | Admitting: Family Medicine

## 2015-11-03 VITALS — BP 136/80 | HR 69 | Temp 98.3°F | Ht 67.75 in | Wt 223.0 lb

## 2015-11-03 DIAGNOSIS — I10 Essential (primary) hypertension: Secondary | ICD-10-CM

## 2015-11-03 DIAGNOSIS — Z0000 Encounter for general adult medical examination without abnormal findings: Secondary | ICD-10-CM | POA: Diagnosis not present

## 2015-11-03 DIAGNOSIS — Z1231 Encounter for screening mammogram for malignant neoplasm of breast: Secondary | ICD-10-CM | POA: Insufficient documentation

## 2015-11-03 DIAGNOSIS — B029 Zoster without complications: Secondary | ICD-10-CM

## 2015-11-03 DIAGNOSIS — R739 Hyperglycemia, unspecified: Secondary | ICD-10-CM

## 2015-11-03 DIAGNOSIS — E669 Obesity, unspecified: Secondary | ICD-10-CM | POA: Diagnosis not present

## 2015-11-03 DIAGNOSIS — R7303 Prediabetes: Secondary | ICD-10-CM | POA: Insufficient documentation

## 2015-11-03 DIAGNOSIS — E119 Type 2 diabetes mellitus without complications: Secondary | ICD-10-CM | POA: Insufficient documentation

## 2015-11-03 MED ORDER — FLUCONAZOLE 150 MG PO TABS: 150.0000 mg | ORAL_TABLET | Freq: Once | ORAL | 0 refills | Status: AC

## 2015-11-03 NOTE — Assessment & Plan Note (Signed)
Much improved on exam today  Has tramadol for prn use

## 2015-11-03 NOTE — Assessment & Plan Note (Signed)
Mild  Disc need for low glycemic diet / wt loss to prevent DM2 Will work on lowering simple and processed carbs in diet  Will plan A1C in 3 mo

## 2015-11-03 NOTE — Assessment & Plan Note (Signed)
bp in fair control at this time  BP Readings from Last 1 Encounters:  11/03/15 136/80   No changes needed Disc lifstyle change with low sodium diet and exercise  Labs reviewed Wt loss enc

## 2015-11-03 NOTE — Assessment & Plan Note (Signed)
Discussed how this problem influences overall health and the risks it imposes  Reviewed plan for weight loss with lower calorie diet (via better food choices and also portion control or program like weight watchers) and exercise building up to or more than 30 minutes 5 days per week including some aerobic activity    

## 2015-11-03 NOTE — Progress Notes (Signed)
Pre visit review using our clinic review tool, if applicable. No additional management support is needed unless otherwise documented below in the visit note. 

## 2015-11-03 NOTE — Progress Notes (Signed)
Subjective:    Patient ID: Crystal Tucker, female    DOB: 24-Mar-1960, 55 y.o.   MRN: [id number]  HPI Here for health maintenance exam and to review chronic medical problems    Recently dx with shingles = overall thinks she is getting better  Also has vaginal yeast infection  Itching/ no d/c    Wt Readings from Last 3 Encounters:  11/03/15 223 lb (101.2 kg)  10/28/15 221 lb 8 oz (100.5 kg)  10/13/15 216 lb 12 oz (98.3 kg)  she was doing very well for a while - before diverticultis and shingles  Was down to 210  Now trying to get back to normal  bmi is 34.1  Hep C/HIV screening - wants to wait for another time    Pap 2/07-neg Had a partial hysterectomy  Does not see a gyn for anything   Mammogram 5/16-neg- has not had one this year / did not get a letting  [facility] to [facility]  Self breast exam  Flu shot -will get at work   Colonoscopy 5/17-had one polyp   Tetanus shot 9/13  bp is stable today  No cp or palpitations or headaches or edema  No side effects to medicines  BP Readings from Last 3 Encounters:  11/03/15 136/80  10/28/15 128/82  10/13/15 126/78     Hx of mild anemia  Lab Results  Component Value Date   WBC 6.0 10/09/2015   HGB 11.8 (L) 10/09/2015   HCT 37.3 10/09/2015   MCV 83.1 10/09/2015   PLT 281 10/09/2015   no rectal bleeding     Chemistry      Component Value Date/Time   NA 138 10/09/2015 2238   K 3.6 10/09/2015 2238   CL 104 10/09/2015 2238   CO2 27 10/09/2015 2238   BUN 12 10/09/2015 2238   CREATININE 0.79 10/09/2015 2238      Component Value Date/Time   CALCIUM 9.4 10/09/2015 2238   ALKPHOS 59 10/09/2015 2238   AST 21 10/09/2015 2238   ALT 18 10/09/2015 2238   BILITOT 0.2 (L) 10/09/2015 2238     glucose was 128  Does not think she has been on any steroids  Right now -diet is not optimal   Had thrush recently  She still has weird taste when she drinks water  Trying to drink 1/2 sweet tea -it has been hard and some cokes   Also eating a lot of sweets  We will check A1C in 3 mo  Has a family hx of DM   Lab Results  Component Value Date   TSH 0.97 10/02/2015    Cholesterol Lab Results  Component Value Date   CHOL 208 (H) 10/02/2015   CHOL 205 (H) 07/11/2014   CHOL 200 02/20/2013   Lab Results  Component Value Date   HDL 62.80 10/02/2015   HDL 60.90 07/11/2014   HDL 50.90 02/20/2013   Lab Results  Component Value Date   LDLCALC 131 (H) 10/02/2015   LDLCALC 132 (H) 07/11/2014   LDLCALC 130 (H) 02/20/2013   Lab Results  Component Value Date   TRIG 71.0 10/02/2015   TRIG 63.0 07/11/2014   TRIG 95.0 02/20/2013   Lab Results  Component Value Date   CHOLHDL 3 10/02/2015   CHOLHDL 3 07/11/2014   CHOLHDL 4 02/20/2013   No results found for: LDLDIRECT Stable / good HDL    Patient Active Problem List   Diagnosis Date Noted  . Encounter  for screening mammogram for breast cancer 11/03/2015  . Hyperglycemia 11/03/2015  . Herpes zoster 10/28/2015  . Left flank pain 10/13/2015  . Benign paroxysmal positional vertigo 02/20/2013  . Obesity 11/10/2011  . Routine general medical examination at a health care facility 11/01/2011  . Allergic rhinitis 07/16/2011  . OSA (obstructive sleep apnea) 07/16/2011  . DEPENDENT EDEMA, LEGS, BILATERAL 10/11/2008  . ANEMIA-NOS 10/26/2006  . Essential hypertension 10/26/2006  . GERD 10/26/2006  . Diverticulosis of large intestine 10/26/2006  . COLONIC POLYPS, HX OF 10/26/2006   Past Medical History:  Diagnosis Date  . Allergy   . Anemia    NOS  . Arthritis   . Benign neoplasm of colon   . Cardiac valve prolapse    leaking valve with surgery 1998  . Contact with or exposure to venereal diseases   . Diverticulosis of colon (without mention of hemorrhage)   . Edema   . GERD (gastroesophageal reflux disease)   . H/O: pneumonia   . Hypertension   . Pneumonia   . Pruritus of genital organs   . Rash and other nonspecific skin eruption   . Sleep apnea    . Unspecified symptom associated with female genital organs    Past Surgical History:  Procedure Laterality Date  . [place]  . COLONOSCOPY  7/03   polyps and tics  . DIAGNOSTIC LAPAROSCOPY    . KNEE ARTHROSCOPY     right  . KNEE SURGERY  10/04  . LAPAROSCOPY N/A 11/12/2014   Procedure: LAPAROSCOPY DIAGNOSTIC;  Surgeon: Molli Posey, MD;  Location: [facility];  Service: Gynecology;  Laterality: N/A;  . PARTIAL HYSTERECTOMY    . POLYPECTOMY    . RECTAL POLYPECTOMY  8/07  . SALPINGOOPHORECTOMY Right 11/12/2014   Procedure: SALPINGO OOPHORECTOMY;  Surgeon: Molli Posey, MD;  Location: [facility];  Service: Gynecology;  Laterality: Right;   Social History  Substance Use Topics  . Smoking status: Former Smoker    Packs/day: 0.80    Years: 30.00    Types: Cigarettes    Quit date: 03/01/2002  . Smokeless tobacco: Never Used  . Alcohol use 0.0 oz/week     Comment: rare   Family History  Problem Relation Age of Onset  . Heart disease Maternal Grandmother   . Cancer Paternal Aunt     type unknown  . Cerebral aneurysm Paternal Aunt   . Cerebral aneurysm Cousin   . Colon cancer Neg Hx   . Colon polyps Neg Hx   . Esophageal cancer Neg Hx   . Rectal cancer Neg Hx   . Stomach cancer Neg Hx    Allergies  Allergen Reactions  . Lisinopril     cough  . Other Rash    All citrus- lemons, oranges, grapefruit, watermelon, cantelope - all fruits with juices    Current Outpatient Prescriptions on File Prior to Visit  Medication Sig Dispense Refill  . Acetaminophen (TYLENOL ARTHRITIS EXT RELIEF PO) Take 2 tablets by mouth daily.    Marland Kitchen albuterol (PROVENTIL HFA;VENTOLIN HFA) 108 (90 BASE) MCG/ACT inhaler Inhale 2 puffs into the lungs every 6 (six) hours as needed for wheezing. 1 Inhaler 5  . amLODipine (NORVASC) 5 MG tablet Take 1 tablet (5 mg total) by mouth daily. 90 tablet 3  . diphenhydrAMINE (BENADRYL) 50 MG capsule Take 50 mg by mouth every 8 (eight) hours as needed  for allergies. Reported on 07/25/2015    . Inulin (FIBER CHOICE) 1.5 G CHEW Chew  1 tablet by mouth 2 (two) times daily.    Marland Kitchen KLOR-CON M20 20 MEQ tablet TAKE ONE TABLET BY MOUTH ONCE DAILY 90 tablet 1  . naproxen (NAPROSYN) 500 MG tablet Take 1 tablet (500 mg total) by mouth 2 (two) times daily. 30 tablet 0  . nystatin (MYCOSTATIN) 100000 UNIT/ML suspension Take 5 mLs (500,000 Units total) by mouth 3 (three) times daily. Swish and swallow 120 mL 0  . omeprazole (PRILOSEC) 20 MG capsule TAKE ONE CAPSULE BY MOUTH ONCE DAILY 90 capsule 0  . ondansetron (ZOFRAN) 4 MG tablet Take 1 tablet (4 mg total) by mouth every 6 (six) hours. 12 tablet 0  . oxyCODONE-acetaminophen (PERCOCET/ROXICET) 5-325 MG per tablet Take 1 tablet by mouth every 4 (four) hours as needed for severe pain. Reported on 07/25/2015    . traMADol (ULTRAM) 50 MG tablet Take 1 tablet (50 mg total) by mouth every 8 (eight) hours as needed for moderate pain or severe pain. 30 tablet 0   No current facility-administered medications on file prior to visit.     Review of Systems Review of Systems  Constitutional: Negative for fever, appetite change,  and unexpected weight change.  Eyes: Negative for pain and visual disturbance.  Respiratory: Negative for cough and shortness of breath.   Cardiovascular: Negative for cp or palpitations    Gastrointestinal: Negative for nausea, diarrhea and constipation.  Genitourinary: Negative for urgency and frequency.  Skin: Negative for pallor and pos for improving shingles rash on L side  Neurological: Negative for weakness, light-headedness, numbness and headaches.  Hematological: Negative for adenopathy. Does not bruise/bleed easily.  Psychiatric/Behavioral: Negative for dysphoric mood. The patient is not nervous/anxious.         Objective:   Physical Exam  Constitutional: She appears well-developed and well-nourished. No distress.  obese and well appearing   HENT:  Head: Normocephalic and  atraumatic.  Right Ear: External ear normal.  Left Ear: External ear normal.  Mouth/Throat: Oropharynx is clear and moist.  Improved thrush  Eyes: Conjunctivae and EOM are normal. Pupils are equal, round, and reactive to light. No scleral icterus.  Neck: Normal range of motion. Neck supple. No JVD present. Carotid bruit is not present. No thyromegaly present.  Cardiovascular: Normal rate, regular rhythm, normal heart sounds and intact distal pulses.  Exam reveals no gallop.   Pulmonary/Chest: Effort normal and breath sounds normal. No respiratory distress. She has no wheezes. She exhibits no tenderness.  Abdominal: Soft. Bowel sounds are normal. She exhibits no distension, no abdominal bruit and no mass. There is no tenderness.  Genitourinary: No breast swelling, tenderness, discharge or bleeding.  Genitourinary Comments: Breast exam: No mass, nodules, thickening, tenderness, bulging, retraction, inflamation, nipple discharge or skin changes noted.  No axillary or clavicular LA.      Musculoskeletal: Normal range of motion. She exhibits no edema or tenderness.  Lymphadenopathy:    She has no cervical adenopathy.  Neurological: She is alert. She has normal reflexes. No cranial nerve deficit. She exhibits normal muscle tone. Coordination normal.  Skin: Skin is warm and dry. No rash noted. No erythema. No pallor.  Vesicles on L flank area are dried up and almost resolved No erythema or excoriations  Few comedones on chest  Psychiatric: She has a normal mood and affect.  Cheerful            Assessment & Plan:   Problem List Items Addressed This Visit      Cardiovascular and Mediastinum  Essential hypertension    bp in fair control at this time  BP Readings from Last 1 Encounters:  11/03/15 136/80   No changes needed Disc lifstyle change with low sodium diet and exercise  Labs reviewed Wt loss enc        Other   Routine general medical examination at a health care facility  - Primary    Reviewed health habits including diet and exercise and skin cancer prevention Reviewed appropriate screening tests for age  Also reviewed health mt list, fam hx and immunization status , as well as social and family history   See HPI Labs reviewed  Will continue to follow hyperglycemia Stop at check out for referral for mammogram  Your blood sugar is creeping up - try to get back to a better diet with less sweets/ sugar drinks and simple carbs ( bread and pasta and rice and potato)  Also exercise when you feel up to it  Schedule non fasting lab in 3 months       Obesity    Discussed how this problem influences overall health and the risks it imposes  Reviewed plan for weight loss with lower calorie diet (via better food choices and also portion control or program like weight watchers) and exercise building up to or more than 30 minutes 5 days per week including some aerobic activity         Hyperglycemia    Mild  Disc need for low glycemic diet / wt loss to prevent DM2 Will work on lowering simple and processed carbs in diet  Will plan A1C in 3 mo       Relevant Orders   Hemoglobin A1c   Herpes zoster    Much improved on exam today  Has tramadol for prn use       Relevant Medications   fluconazole (DIFLUCAN) 150 MG tablet   Encounter for screening mammogram for breast cancer    Scheduled annual screening mammogram Nl breast exam today  Encouraged monthly self exams        Relevant Orders   MM DIGITAL SCREENING BILATERAL    Other Visit Diagnoses   None.

## 2015-11-03 NOTE — Assessment & Plan Note (Signed)
Reviewed health habits including diet and exercise and skin cancer prevention Reviewed appropriate screening tests for age  Also reviewed health mt list, fam hx and immunization status , as well as social and family history   See HPI Labs reviewed  Will continue to follow hyperglycemia Stop at check out for referral for mammogram  Your blood sugar is creeping up - try to get back to a better diet with less sweets/ sugar drinks and simple carbs ( bread and pasta and rice and potato)  Also exercise when you feel up to it  Schedule non fasting lab in 3 months

## 2015-11-03 NOTE — Assessment & Plan Note (Signed)
Scheduled annual screening mammogram Nl breast exam today  Encouraged monthly self exams   

## 2015-11-03 NOTE — Patient Instructions (Addendum)
Stop at check out for referral for mammogram  Your blood sugar is creeping up - try to get back to a better diet with less sweets/ sugar drinks and simple carbs ( bread and pasta and rice and potato)  Also exercise when you feel up to it  Schedule non fasting lab in 3 months

## 2015-11-05 ENCOUNTER — Telehealth: Payer: Self-pay | Admitting: Family Medicine

## 2015-11-05 NOTE — Telephone Encounter (Signed)
Done and in IN box 

## 2015-11-05 NOTE — Telephone Encounter (Signed)
Patient had a physical on Monday.  Patient came in a couple weeks ago for a sick visit and brought a form for work to be filled out at her wellness visit.  Patient said Dr.Tower kept the form and said she'd fill it out at the physical.  Patient forgot to mention it at the physical.  Dr.Tower do you have the form?  Patient asked when it's filled out, to fax the form to [phone] ATTN: [facility].  It's a secure fax.  If you don't have the form, patient said we can call her back and she'll fax the form.

## 2015-11-05 NOTE — Telephone Encounter (Signed)
Form faxed and I left voicemail letting pt know form faxed and copy sent to scanning

## 2015-11-11 ENCOUNTER — Ambulatory Visit
Admission: RE | Admit: 2015-11-11 | Discharge: 2015-11-11 | Disposition: A | Discharge status: Home / Self Care | Payer: Commercial Managed Care - PPO | Source: Ambulatory Visit | Attending: Family Medicine | Admitting: Family Medicine

## 2015-11-11 DIAGNOSIS — Z1231 Encounter for screening mammogram for malignant neoplasm of breast: Secondary | ICD-10-CM

## 2015-11-11 LAB — HM MAMMOGRAPHY

## 2015-11-13 ENCOUNTER — Encounter: Payer: Self-pay | Admitting: *Deleted

## 2015-11-24 ENCOUNTER — Other Ambulatory Visit: Payer: Self-pay | Admitting: Family Medicine

## 2015-12-08 ENCOUNTER — Telehealth: Payer: Self-pay | Admitting: *Deleted

## 2015-12-08 MED ORDER — AMOXICILLIN 500 MG PO TABS
ORAL_TABLET | ORAL | 3 refills | Status: DC
Start: 2015-12-08 — End: 2020-06-13

## 2015-12-08 NOTE — Telephone Encounter (Signed)
Patient left a voicemail stating that she is changing dentist and has an appointment scheduled Wednesday. Patient stated that she needs her pre-medicated medication sent to the pharmacy. Pharmacy [facility]

## 2015-12-08 NOTE — Telephone Encounter (Signed)
Pt notified Rx sent to pharmacy

## 2015-12-08 NOTE — Telephone Encounter (Signed)
I sent amoxicillin to her pharmacy  Hx of valve surgery in the past

## 2016-02-03 ENCOUNTER — Other Ambulatory Visit (INDEPENDENT_AMBULATORY_CARE_PROVIDER_SITE_OTHER): Payer: Commercial Managed Care - PPO

## 2016-02-03 DIAGNOSIS — R739 Hyperglycemia, unspecified: Secondary | ICD-10-CM

## 2016-02-03 LAB — HEMOGLOBIN A1C: Hgb A1c MFr Bld: 6.4 % (ref 4.6–6.5)

## 2016-02-04 ENCOUNTER — Encounter: Payer: Self-pay | Admitting: *Deleted

## 2016-08-17 ENCOUNTER — Other Ambulatory Visit: Payer: Self-pay | Admitting: Family Medicine

## 2016-10-07 ENCOUNTER — Other Ambulatory Visit: Payer: Self-pay | Admitting: *Deleted

## 2016-10-07 NOTE — Telephone Encounter (Signed)
Pt left message at triage requesting refill of inhaler. She states it currently costs her $70 and she is willing to change meds for a more economic one. Rx can be sent to pharmacy on file. Last OV 10/2015. Attempted to contact pt schedule OV; lmom,  as last Rx 2016. pls advise

## 2016-10-08 NOTE — Telephone Encounter (Signed)
I wrote for generic albuterol HFA and epic does not tell me which brand is covered over another (proventil vs ventolin etc)  Please contact her pharmacy and send in whatever is most affordable

## 2016-10-14 MED ORDER — ALBUTEROL SULFATE HFA 108 (90 BASE) MCG/ACT IN AERS: 2.0000 | INHALATION_SPRAY | Freq: Four times a day (QID) | RESPIRATORY_TRACT | 2 refills | Status: DC | PRN

## 2016-10-14 NOTE — Telephone Encounter (Signed)
Yes- and let the pt know that is the best we can do  ? If they may have coupons on line she could look for

## 2016-10-14 NOTE — Telephone Encounter (Signed)
Rx sent to pharmacy, and pt advise of Dr. Marliss Coots comments. Pt will check to see if she can find a saving card or coupon online

## 2016-10-14 NOTE — Telephone Encounter (Signed)
Pharmacy said that the cheapest inhaler they have is the ventolin which is $65, they said a lot of insurances are not covering inhalers anymore and the out of pocket price is $65, do you want me to send in the Rx?

## 2016-10-18 ENCOUNTER — Other Ambulatory Visit: Payer: Self-pay | Admitting: Family Medicine

## 2016-10-20 MED ORDER — ALBUTEROL SULFATE HFA 108 (90 BASE) MCG/ACT IN AERS: 2.0000 | INHALATION_SPRAY | Freq: Four times a day (QID) | RESPIRATORY_TRACT | 5 refills | Status: AC | PRN

## 2016-10-20 NOTE — Telephone Encounter (Signed)
Pharmacy sent fax requesting Rx for Proair sent due to insurance not covering proventil or ventolin, done

## 2016-10-20 NOTE — Addendum Note (Signed)
Addended by: Tammi Sou on: 10/20/2016 09:11 AM   Modules accepted: Orders

## 2016-10-25 ENCOUNTER — Telehealth: Payer: Self-pay | Admitting: Family Medicine

## 2016-10-25 DIAGNOSIS — R739 Hyperglycemia, unspecified: Secondary | ICD-10-CM

## 2016-10-25 DIAGNOSIS — I10 Essential (primary) hypertension: Secondary | ICD-10-CM

## 2016-10-25 DIAGNOSIS — Z0000 Encounter for general adult medical examination without abnormal findings: Secondary | ICD-10-CM

## 2016-10-25 NOTE — Telephone Encounter (Signed)
-----   Message from Marchia Bond sent at 10/14/2016  2:54 PM EDT ----- Regarding: Cpx labs Tues 8/21, need orders. Thanks :-) Please order  future cpx labs for pt's upcoming lab appt. Thanks Aniceto Boss

## 2016-10-26 ENCOUNTER — Other Ambulatory Visit (INDEPENDENT_AMBULATORY_CARE_PROVIDER_SITE_OTHER): Payer: Commercial Managed Care - PPO

## 2016-10-26 DIAGNOSIS — R739 Hyperglycemia, unspecified: Secondary | ICD-10-CM

## 2016-10-26 DIAGNOSIS — I10 Essential (primary) hypertension: Secondary | ICD-10-CM | POA: Diagnosis not present

## 2016-10-26 LAB — COMPREHENSIVE METABOLIC PANEL
ALBUMIN: 3.7 g/dL (ref 3.5–5.2)
ALK PHOS: 63 U/L (ref 39–117)
ALT: 22 U/L (ref 0–35)
AST: 16 U/L (ref 0–37)
BUN: 12 mg/dL (ref 6–23)
CO2: 31 mEq/L (ref 19–32)
Calcium: 9 mg/dL (ref 8.4–10.5)
Chloride: 105 mEq/L (ref 96–112)
Creatinine, Ser: 0.76 mg/dL (ref 0.40–1.20)
GFR: 101.04 mL/min (ref >60.00)
GLUCOSE: 122 mg/dL — AB (ref 70–99)
POTASSIUM: 3.9 meq/L (ref 3.5–5.1)
Sodium: 141 mEq/L (ref 135–145)
TOTAL PROTEIN: 6.6 g/dL (ref 6.0–8.3)
Total Bilirubin: 0.4 mg/dL (ref 0.2–1.2)

## 2016-10-26 LAB — CBC WITH DIFFERENTIAL/PLATELET
BASOS PCT: 1.5 % (ref 0.0–3.0)
Basophils Absolute: 0.1 10*3/uL (ref 0.0–0.1)
EOS PCT: 2.1 % (ref 0.0–5.0)
Eosinophils Absolute: 0.1 10*3/uL (ref 0.0–0.7)
HCT: 37.2 % (ref 36.0–46.0)
Hemoglobin: 12 g/dL (ref 12.0–15.0)
LYMPHS ABS: 2.6 10*3/uL (ref 0.7–4.0)
Lymphocytes Relative: 55.4 % — ABNORMAL HIGH (ref 12.0–46.0)
MCHC: 32.3 g/dL (ref 30.0–36.0)
MCV: 83.6 fl (ref 78.0–100.0)
MONOS PCT: 6.4 % (ref 3.0–12.0)
Monocytes Absolute: 0.3 10*3/uL (ref 0.1–1.0)
NEUTROS ABS: 1.6 10*3/uL (ref 1.4–7.7)
NEUTROS PCT: 34.6 % — AB (ref 43.0–77.0)
Platelets: 254 10*3/uL (ref 150.0–400.0)
RBC: 4.46 Mil/uL (ref 3.87–5.11)
RDW: 14.5 % (ref 11.5–15.5)
WBC: 4.7 10*3/uL (ref 4.0–10.5)

## 2016-10-26 LAB — LIPID PANEL
CHOL/HDL RATIO: 4
Cholesterol: 176 mg/dL (ref 0–200)
HDL: 50.3 mg/dL (ref >39.00)
LDL CALC: 108 mg/dL — AB (ref 0–99)
NONHDL: 126.06
Triglycerides: 92 mg/dL (ref 0.0–149.0)
VLDL: 18.4 mg/dL (ref 0.0–40.0)

## 2016-10-26 LAB — TSH: TSH: 1.89 u[IU]/mL (ref 0.35–4.50)

## 2016-10-26 LAB — HEMOGLOBIN A1C: HEMOGLOBIN A1C: 6.7 % — AB (ref 4.6–6.5)

## 2016-11-03 ENCOUNTER — Ambulatory Visit (INDEPENDENT_AMBULATORY_CARE_PROVIDER_SITE_OTHER): Payer: Commercial Managed Care - PPO | Admitting: Family Medicine

## 2016-11-03 ENCOUNTER — Encounter: Payer: Self-pay | Admitting: Family Medicine

## 2016-11-03 VITALS — BP 130/80 | HR 69 | Temp 98.4°F | Ht 67.5 in | Wt 243.0 lb

## 2016-11-03 DIAGNOSIS — Z0000 Encounter for general adult medical examination without abnormal findings: Secondary | ICD-10-CM | POA: Diagnosis not present

## 2016-11-03 DIAGNOSIS — Z114 Encounter for screening for human immunodeficiency virus [HIV]: Secondary | ICD-10-CM

## 2016-11-03 DIAGNOSIS — D649 Anemia, unspecified: Secondary | ICD-10-CM | POA: Diagnosis not present

## 2016-11-03 DIAGNOSIS — E119 Type 2 diabetes mellitus without complications: Secondary | ICD-10-CM

## 2016-11-03 DIAGNOSIS — E66812 Obesity, class 2: Secondary | ICD-10-CM

## 2016-11-03 DIAGNOSIS — I10 Essential (primary) hypertension: Secondary | ICD-10-CM | POA: Diagnosis not present

## 2016-11-03 DIAGNOSIS — Z1159 Encounter for screening for other viral diseases: Secondary | ICD-10-CM | POA: Diagnosis not present

## 2016-11-03 DIAGNOSIS — Z6837 Body mass index (BMI) 37.0-37.9, adult: Secondary | ICD-10-CM

## 2016-11-03 DIAGNOSIS — E785 Hyperlipidemia, unspecified: Secondary | ICD-10-CM

## 2016-11-03 MED ORDER — AMLODIPINE BESYLATE 5 MG PO TABS: 5.0000 mg | ORAL_TABLET | Freq: Every day | ORAL | 3 refills | Status: DC

## 2016-11-03 MED ORDER — POTASSIUM CHLORIDE CRYS ER 20 MEQ PO TBCR: 20.0000 meq | EXTENDED_RELEASE_TABLET | Freq: Every day | ORAL | 3 refills | Status: DC

## 2016-11-03 MED ORDER — METFORMIN HCL ER 500 MG PO TB24: 500.0000 mg | ORAL_TABLET | Freq: Every day | ORAL | 11 refills | Status: DC

## 2016-11-03 NOTE — Progress Notes (Signed)
Subjective:    Patient ID: Crystal Tucker, female    DOB: 02-10-1961, 56 y.o.   MRN: [id number]  HPI  Here for health maintenance exam and to review chronic medical problems    Considering all she has been through since November she is doing ok  Terrible fall in April (shoulder injury)  Has not been back to work yet  Had to have another surgery in July - to clean it up again - had "a mess" of scar tissue  Had breathing issues with surgery at one time  ? If related to sleep apnea  Has to go back for her wrist -needs surgery because her hand still hurts despite her PT  Has appt in sept   L heel pain  ? Plantar fasciitis  Has had it in the other foot in the past    Is not giving up however   Lot of family problems also  Has to kick her husband out    Wt Readings from Last 3 Encounters:  11/03/16 243 lb (110.2 kg)  11/03/15 223 lb (101.2 kg)  10/28/15 221 lb 8 oz (100.5 kg)  making the effort to take care of herself  Is upset about her wt - could not move much for months with wrist/shoulder problems  Misses being active -that is gradually improving  Back to walking some  Diet - "all over the place" - in the past 2 weeks starting to cut back (sweets) and also stopped sodas  Also portion control (biggest problem) Nervous energy  37.50 kg/m  Hep C/HIV screen  She is not high risk  Is interested in doing it   Flu shot   Mammogram 9/17 -nl Self breast exam   Colonoscopy 5/17 Recall 5 y in 5/22   Tetanus shot 9/13 (Tdap)  Pap 2/07 nl  Had a partial hysterectomy in the past  No symptoms   bp is up on first check today  No cp or palpitations or headaches or edema  No side effects to medicines  BP Readings from Last 3 Encounters:  11/03/16 (!) 142/90  11/03/15 136/80  10/28/15 128/82  amlodipine  Better on 2nd check BP: 130/80    Takes K  Lab Results  Component Value Date   CREATININE 0.76 10/26/2016   BUN 12 10/26/2016   NA 141 10/26/2016   K 3.9  10/26/2016   CL 105 10/26/2016   CO2 31 10/26/2016   Lab Results  Component Value Date   ALT 22 10/26/2016   AST 16 10/26/2016   ALKPHOS 63 10/26/2016   BILITOT 0.4 10/26/2016   glucose 122 Lab Results  Component Value Date   TSH 1.89 10/26/2016     Hyperglycemia Lab Results  Component Value Date   HGBA1C 6.7 (H) 10/26/2016   This is up from 6.4 In DM range Declines diabetic teaching because her mother did it and can teach her    Hx of mild anemia in the past  Lab Results  Component Value Date   WBC 4.7 10/26/2016   HGB 12.0 10/26/2016   HCT 37.2 10/26/2016   MCV 83.6 10/26/2016   PLT 254.0 10/26/2016   nl range now   Lab Results  Component Value Date   CHOL 176 10/26/2016   CHOL 208 (H) 10/02/2015   CHOL 205 (H) 07/11/2014   Lab Results  Component Value Date   HDL 50.30 10/26/2016   HDL 62.80 10/02/2015   HDL 60.90 07/11/2014   Lab Results  Component Value Date   LDLCALC 108 (H) 10/26/2016   LDLCALC 131 (H) 10/02/2015   LDLCALC 132 (H) 07/11/2014   Lab Results  Component Value Date   TRIG 92.0 10/26/2016   TRIG 71.0 10/02/2015   TRIG 63.0 07/11/2014   Lab Results  Component Value Date   CHOLHDL 4 10/26/2016   CHOLHDL 3 10/02/2015   CHOLHDL 3 07/11/2014   No results found for: LDLDIRECT  Overall LDL is down  So is HDL - will improve with exercise    Review of Systems Review of Systems  Constitutional: Negative for fever, appetite change,  and unexpected weight change.  Eyes: Negative for pain and visual disturbance.  Respiratory: Negative for cough and shortness of breath.   Cardiovascular: Negative for cp or palpitations    Gastrointestinal: Negative for nausea, diarrhea and constipation.  Genitourinary: Negative for urgency and frequency.  Skin: Negative for pallor or rash   MSK pos for R shoulder pain and dec rom, pos for foot pain  Neurological: Negative for weakness, light-headedness, numbness and headaches.  Hematological:  Negative for adenopathy. Does not bruise/bleed easily.  Psychiatric/Behavioral: Negative for dysphoric mood. The patient is not nervous/anxious. Pos for severe stressors         Objective:   Physical Exam  Constitutional: She appears well-developed and well-nourished. No distress.  obese and well appearing   HENT:  Head: Normocephalic and atraumatic.  Right Ear: External ear normal.  Left Ear: External ear normal.  Mouth/Throat: Oropharynx is clear and moist.  Eyes: Pupils are equal, round, and reactive to light. Conjunctivae and EOM are normal. No scleral icterus.  Neck: Normal range of motion. Neck supple. No JVD present. Carotid bruit is not present. No thyromegaly present.  Cardiovascular: Normal rate, regular rhythm, normal heart sounds and intact distal pulses.  Exam reveals no gallop.   Pulmonary/Chest: Effort normal and breath sounds normal. No respiratory distress. She has no wheezes. She exhibits no tenderness.  Abdominal: Soft. Bowel sounds are normal. She exhibits no distension, no abdominal bruit and no mass. There is no tenderness.  Genitourinary: No breast swelling, tenderness, discharge or bleeding.  Genitourinary Comments: Breast exam: No mass, nodules, thickening, tenderness, bulging, retraction, inflamation, nipple discharge or skin changes noted.  No axillary or clavicular LA.      Musculoskeletal: Normal range of motion. She exhibits no edema or tenderness.  Limited rom of R shoulder with pain   Lymphadenopathy:    She has no cervical adenopathy.  Neurological: She is alert. She has normal reflexes. No cranial nerve deficit. She exhibits normal muscle tone. Coordination normal.  Skin: Skin is warm and dry. No rash noted. No erythema. No pallor.  Few skin tags  Psychiatric: She has a normal mood and affect.  Seems stressed           Assessment & Plan:   Problem List Items Addressed This Visit      Cardiovascular and Mediastinum   Essential hypertension      bp in fair control at this time  BP Readings from Last 1 Encounters:  11/03/16 130/80   No changes needed Disc lifstyle change with low sodium diet and exercise  Labs rev  Wt loss enc      Relevant Medications   amLODipine (NORVASC) 5 MG tablet   Other Relevant Orders   Basic metabolic panel     Endocrine   DM (diabetes mellitus), type 2 ([place])    New diagnosis  Lab Results  Component Value  Date   HGBA1C 6.7 (H) 10/26/2016   Bad diet /exercise habits and wt gain - after severe shoulder injury  Disc plan for diet (mother will teach her )  Exercise- as tol Metformin xr 500 daily as tol- update if side eff Disc eye and foot care F/u 3 mo to rev in more detail - renal prot etc        Relevant Medications   metFORMIN (GLUCOPHAGE-XR) 500 MG 24 hr tablet   Other Relevant Orders   Hemoglobin A1c     Other   RESOLVED: ANEMIA-NOS    Nl lab today       Encounter for hepatitis C screening test for low risk patient   Relevant Orders   Hepatitis C antibody (Completed)   Encounter for screening for HIV   Relevant Orders   HIV antibody (with reflex) (Completed)   Hyperlipidemia, mild    Good HDL  LDL 108  Would like it under 100 with new DM diagnosis  Consider statin if no imp with diet/exercise  Disc goals for lipids and reasons to control them Rev labs with pt Rev low sat fat diet in detail       Relevant Medications   amLODipine (NORVASC) 5 MG tablet   Other Relevant Orders   Lipid panel   Obesity    Discussed how this problem influences overall health and the risks it imposes  Reviewed plan for weight loss with lower calorie diet (via better food choices and also portion control or program like weight watchers) and exercise building up to or more than 30 minutes 5 days per week including some aerobic activity   Limited exercise due to shoulder-disc bike      Relevant Medications   metFORMIN (GLUCOPHAGE-XR) 500 MG 24 hr tablet   Routine general medical  examination at a health care facility - Primary    Reviewed health habits including diet and exercise and skin cancer prevention Reviewed appropriate screening tests for age  Also reviewed health mt list, fam hx and immunization status , as well as social and family history   See HPI Labs rev  Enc wt loss  Disc new DM diagnosis and tx opt  Screen for HIV and hep C She will schedule her own mammogram Also flu shot in the fall  Exercise plan discussed

## 2016-11-03 NOTE — Patient Instructions (Addendum)
Think about using a recumbent exercise bike for exercise  This will help physically and emotionally    Your labs are in the diabetic range now  Try to get most of your carbohydrates from produce (with the exception of white potatoes)  Eat less bread/pasta/rice/snack foods/cereals/sweets and other items from the middle of the grocery store (processed carbs)  Also avoid sugar drinks   (just drink water) Work with your mother on this   I want you to try metformin xr for diabetes  If side effects -stop it and let me know    lab today for HIV and Hep C   Don't forget to get a flu shot in the fall   Your mammogram is due next month-don't forget to schedule that   Follow up in 3 months with lab prior

## 2016-11-04 DIAGNOSIS — E1169 Type 2 diabetes mellitus with other specified complication: Secondary | ICD-10-CM | POA: Insufficient documentation

## 2016-11-04 DIAGNOSIS — E785 Hyperlipidemia, unspecified: Secondary | ICD-10-CM | POA: Insufficient documentation

## 2016-11-04 LAB — HIV ANTIBODY (ROUTINE TESTING W REFLEX): HIV 1&2 Ab, 4th Generation: NONREACTIVE

## 2016-11-04 LAB — HEPATITIS C ANTIBODY: HCV Ab: NONREACTIVE

## 2016-11-04 NOTE — Assessment & Plan Note (Signed)
Nl lab today

## 2016-11-04 NOTE — Assessment & Plan Note (Signed)
Good HDL  LDL 108  Would like it under 100 with new DM diagnosis  Consider statin if no imp with diet/exercise  Disc goals for lipids and reasons to control them Rev labs with pt Rev low sat fat diet in detail

## 2016-11-04 NOTE — Assessment & Plan Note (Signed)
Discussed how this problem influences overall health and the risks it imposes  Reviewed plan for weight loss with lower calorie diet (via better food choices and also portion control or program like weight watchers) and exercise building up to or more than 30 minutes 5 days per week including some aerobic activity   Limited exercise due to shoulder-disc bike

## 2016-11-04 NOTE — Assessment & Plan Note (Signed)
Reviewed health habits including diet and exercise and skin cancer prevention Reviewed appropriate screening tests for age  Also reviewed health mt list, fam hx and immunization status , as well as social and family history   See HPI Labs rev  Enc wt loss  Disc new DM diagnosis and tx opt  Screen for HIV and hep C She will schedule her own mammogram Also flu shot in the fall  Exercise plan discussed

## 2016-11-04 NOTE — Assessment & Plan Note (Signed)
bp in fair control at this time  BP Readings from Last 1 Encounters:  11/03/16 130/80   No changes needed Disc lifstyle change with low sodium diet and exercise  Labs rev  Wt loss enc

## 2016-11-04 NOTE — Assessment & Plan Note (Signed)
New diagnosis  Lab Results  Component Value Date   HGBA1C 6.7 (H) 10/26/2016   Bad diet /exercise habits and wt gain - after severe shoulder injury  Disc plan for diet (mother will teach her )  Exercise- as tol Metformin xr 500 daily as tol- update if side eff Disc eye and foot care F/u 3 mo to rev in more detail - renal prot etc

## 2016-11-15 ENCOUNTER — Other Ambulatory Visit: Payer: Self-pay | Admitting: Family Medicine

## 2017-02-03 ENCOUNTER — Other Ambulatory Visit: Payer: Commercial Managed Care - PPO

## 2017-02-08 ENCOUNTER — Ambulatory Visit: Payer: Commercial Managed Care - PPO | Admitting: Family Medicine

## 2017-05-14 ENCOUNTER — Other Ambulatory Visit: Payer: Self-pay | Admitting: Family Medicine

## 2017-05-16 ENCOUNTER — Telehealth: Payer: Self-pay | Admitting: Family Medicine

## 2017-05-16 NOTE — Telephone Encounter (Signed)
Pt made aware Rx at pharmacy

## 2017-05-16 NOTE — Telephone Encounter (Signed)
Copied from [place]. Topic: Quick Communication - Rx Refill/Question >> May 16, 2017 12:12 PM Waylan Rocher, Lumin L wrote: Medication: omeprazole (PRILOSEC) 20 MG capsule (1 pill left)  Has the patient contacted their pharmacy? Yes.    (Agent: If no, request that the patient contact the pharmacy for the refill.)  Preferred Pharmacy (with phone number or street name): [facility] [address] [place] [address] Phone: [phone] Fax: [phone]  Agent: Please be advised that RX refills may take up to 3 business days. We ask that you follow-up with your pharmacy.

## 2017-05-19 ENCOUNTER — Encounter: Payer: Self-pay | Admitting: Family Medicine

## 2017-05-19 ENCOUNTER — Encounter: Payer: Self-pay | Admitting: *Deleted

## 2017-05-19 ENCOUNTER — Other Ambulatory Visit: Payer: Self-pay

## 2017-05-19 ENCOUNTER — Ambulatory Visit: Payer: BLUE CROSS/BLUE SHIELD | Admitting: Family Medicine

## 2017-05-19 VITALS — BP 140/84 | HR 67 | Temp 98.2°F | Ht 67.5 in | Wt 239.2 lb

## 2017-05-19 DIAGNOSIS — R300 Dysuria: Secondary | ICD-10-CM | POA: Diagnosis not present

## 2017-05-19 LAB — POC URINALSYSI DIPSTICK (AUTOMATED)
Bilirubin, UA: NEGATIVE
Blood, UA: NEGATIVE
GLUCOSE UA: NEGATIVE
Ketones, UA: NEGATIVE
Leukocytes, UA: NEGATIVE
NITRITE UA: NEGATIVE
Protein, UA: NEGATIVE
SPEC GRAV UA: 1.025 (ref 1.010–1.025)
UROBILINOGEN UA: 0.2 U/dL
pH, UA: 6 (ref 5.0–8.0)

## 2017-05-19 NOTE — Progress Notes (Signed)
Dr. Frederico Hamman T. Avri Paiva, MD, [facility] [place], [address] Phone: [phone] Fax: [phone]  05/19/2017  Patient: Crystal Tucker, MRN: [id number], DOB: 11-18-1960, 57 y.o.  Primary Physician:  Tower, Wynelle Fanny, MD   Chief Complaint  Patient presents with  . Dysuria  . Abdominal Pain    Pressure   Subjective:   Crystal Tucker is a 57 y.o. very pleasant female patient who presents with the following:  UTI?  Patient subjectively feels like she might have a urinary tract infection, she does have some dysuria as well as some relatively mild abdominal pressure.  She also has a little bit of some abdominal distention, but she does have also some constipation.  She is not having any frank pain.  She is not having any bloody bowel movements or melena.  Add fluid and colace    Past Medical History, Surgical History, Social History, Family History, Problem List, Medications, and Allergies have been reviewed and updated if relevant.  Patient Active Problem List   Diagnosis Date Noted  . Hyperlipidemia, mild 11/04/2016  . Encounter for screening for HIV 11/03/2016  . Encounter for hepatitis C screening test for low risk patient 11/03/2016  . Encounter for screening mammogram for breast cancer 11/03/2015  . DM (diabetes mellitus), type 2 ([place]) 11/03/2015  . Herpes zoster 10/28/2015  . Benign paroxysmal positional vertigo 02/20/2013  . Obesity 11/10/2011  . Routine general medical examination at a health care facility 11/01/2011  . Allergic rhinitis 07/16/2011  . OSA (obstructive sleep apnea) 07/16/2011  . DEPENDENT EDEMA, LEGS, BILATERAL 10/11/2008  . Essential hypertension 10/26/2006  . GERD 10/26/2006  . Diverticulosis of large intestine 10/26/2006  . COLONIC POLYPS, HX OF 10/26/2006    Past Medical History:  Diagnosis Date  . Allergy   . Anemia    NOS  . Arthritis   . Benign neoplasm of colon   . 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 Contact with or exposure to venereal diseases   . Diverticulosis of colon (without mention of hemorrhage)   . Edema   . GERD (gastroesophageal reflux disease)   . H/O: pneumonia   . Hypertension   . Pneumonia   . Pruritus of genital organs   . Rash and other nonspecific skin eruption   . Sleep apnea   . Unspecified symptom associated with female genital organs     Past Surgical History:  Procedure Laterality Date  . [place]  . COLONOSCOPY  7/03   polyps and tics  . DIAGNOSTIC LAPAROSCOPY    . KNEE ARTHROSCOPY     right  . KNEE SURGERY  10/04  . LAPAROSCOPY N/A 11/12/2014   Procedure: LAPAROSCOPY DIAGNOSTIC;  Surgeon: Molli Posey, MD;  Location: [facility];  Service: Gynecology;  Laterality: N/A;  . PARTIAL HYSTERECTOMY    . POLYPECTOMY    . RECTAL POLYPECTOMY  8/07  . SALPINGOOPHORECTOMY Right 11/12/2014   Procedure: SALPINGO OOPHORECTOMY;  Surgeon: Molli Posey, MD;  Location: [facility];  Service: Gynecology;  Laterality: Right;    Social History   Socioeconomic History  . Marital status: Married    Spouse name: Not on file  . Number of children: 1  . Years of education: Not on file  . Highest education level: Not on file  Social Needs  . Financial resource strain: Not on file  . Food insecurity -  worry: Not on file  . Food insecurity - inability: Not on file  . Transportation needs - medical: Not on file  . Transportation needs - non-medical: Not on file  Occupational History  . 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 Smoking status: Former Smoker    Packs/day: 0.80    Years: 30.00    Pack years: 24.00    Types: Cigarettes    Last attempt to quit: 03/01/2002    Years since quitting: 15.2  . Smokeless tobacco: Never Used  Substance and Sexual Activity  . Alcohol use: Yes    Alcohol/week: 0.0 oz    Comment: rare  . Drug use: No  . Sexual activity: Not  on file  Other Topics Concern  . Not on file  Social History Narrative   Married      1 child      Ultracraft-Laid off      Works part-time home health      Caffeine: 1 daily    Family History  Problem Relation Age of Onset  . Heart disease Maternal Grandmother   . Cancer Paternal Aunt        type unknown  . Cerebral aneurysm Paternal Aunt   . Cerebral aneurysm Cousin   . Colon cancer Neg Hx   . Colon polyps Neg Hx   . Esophageal cancer Neg Hx   . Rectal cancer Neg Hx   . Stomach cancer Neg Hx     Allergies  Allergen Reactions  . Lisinopril     cough  . Other Rash    All citrus- lemons, oranges, grapefruit, watermelon, cantelope - all fruits with juices     Medication list reviewed and updated in full in [place].  ROS: GEN: Acute illness details above GI: Tolerating PO intake GU: maintaining adequate hydration and urination Pulm: No SOB Interactive and getting along well at home.  Otherwise, ROS is as per the HPI.  Objective:   BP 140/84   Pulse 67   Temp 98.2 F (36.8 C) (Oral)   Ht 5' 7.5" (1.715 m)   Wt 239 lb 4 oz (108.5 kg)   BMI 36.92 kg/m   GEN: WDWN, NAD, Non-toxic, A & O x 3 HEENT: Atraumatic, Normocephalic. Neck supple. No masses, No LAD. Ears and Nose: No external deformity. CV: RRR, No M/G/R. No JVD. No thrill. No extra heart sounds. PULM: CTA B, no wheezes, crackles, rhonchi. No retractions. No resp. distress. No accessory muscle use. ABD: S, NT, ND, +BS. No rebound. No HSM. EXTR: No c/c/e NEURO Normal gait.  PSYCH: Normally interactive. Conversant. Not depressed or anxious appearing.  Calm demeanor.     Laboratory and Imaging Data: Results for orders placed or performed in visit on 05/19/17  POCT Urinalysis Dipstick (Automated)  Result Value Ref Range   Color, UA yellow    Clarity, UA clear    Glucose, UA negative    Bilirubin, UA negative    Ketones, UA negative    Spec Grav, UA 1.025 1.010 - 1.025   Blood, UA  negative    pH, UA 6.0 5.0 - 8.0   Protein, UA negative    Urobilinogen, UA 0.2 0.2 or 1.0 E.U./dL   Nitrite, UA negative    Leukocytes, UA Negative Negative     Assessment and Plan:   Dysuria - Plan: POCT Urinalysis Dipstick (Automated), Urine Culture  Her exam is completely benign, and her UA is completely  benign.  Fairly unlikely that she has a UTI.  I am going to culture, given her symptoms.  Is a little bit of distention as well as some constipation, some and I have her push fluids and add some stool softeners.  Follow-up: No Follow-up on file.  Orders Placed This Encounter  Procedures  . Urine Culture  . POCT Urinalysis Dipstick (Automated)    Signed,  Johnn Krasowski T. Delainie Chavana, MD   Allergies as of 05/19/2017      Reactions   Lisinopril    cough   Other Rash   All citrus- lemons, oranges, grapefruit, watermelon, cantelope - all fruits with juices       Medication List        Accurate as of 05/19/17 11:59 PM. Always use your most recent med list.          albuterol 108 (90 Base) MCG/ACT inhaler Commonly known as:  PROAIR HFA Inhale 2 puffs into the lungs every 6 (six) hours as needed for wheezing or shortness of breath.   amLODipine 5 MG tablet Commonly known as:  NORVASC Take 1 tablet (5 mg total) by mouth daily.   amoxicillin 500 MG tablet Commonly known as:  AMOXIL Take 4 pills at one time by mouth 60 minutes before dental visit   metFORMIN 500 MG 24 hr tablet Commonly known as:  GLUCOPHAGE-XR Take 1 tablet (500 mg total) by mouth daily with breakfast.   omeprazole 20 MG capsule Commonly known as:  PRILOSEC TAKE 1 CAPSULE BY MOUTH ONCE DAILY   potassium chloride SA 20 MEQ tablet Commonly known as:  KLOR-CON M20 Take 1 tablet (20 mEq total) by mouth daily.   TYLENOL ARTHRITIS EXT RELIEF PO Take 2 tablets by mouth daily.

## 2017-05-20 LAB — URINE CULTURE
MICRO NUMBER:: 90325585
RESULT: NO GROWTH
SPECIMEN QUALITY:: ADEQUATE

## 2017-07-05 DIAGNOSIS — M24139 Other articular cartilage disorders, unspecified wrist: Secondary | ICD-10-CM | POA: Insufficient documentation

## 2017-11-08 ENCOUNTER — Other Ambulatory Visit: Payer: Self-pay

## 2017-11-11 ENCOUNTER — Encounter: Payer: Self-pay | Admitting: Family Medicine

## 2017-11-14 ENCOUNTER — Other Ambulatory Visit: Payer: Self-pay | Admitting: Family Medicine

## 2017-11-17 ENCOUNTER — Other Ambulatory Visit: Payer: Self-pay | Admitting: Family Medicine

## 2017-11-17 ENCOUNTER — Telehealth: Payer: Self-pay | Admitting: *Deleted

## 2017-11-17 MED ORDER — POTASSIUM CHLORIDE CRYS ER 20 MEQ PO TBCR: 20.0000 meq | EXTENDED_RELEASE_TABLET | Freq: Every day | ORAL | 0 refills | Status: DC

## 2017-11-17 MED ORDER — OMEPRAZOLE 20 MG PO CPDR: 20.0000 mg | DELAYED_RELEASE_CAPSULE | Freq: Every day | ORAL | 0 refills | Status: DC

## 2017-11-17 MED ORDER — AMLODIPINE BESYLATE 5 MG PO TABS: 5.0000 mg | ORAL_TABLET | Freq: Every day | ORAL | 0 refills | Status: DC

## 2017-11-17 NOTE — Telephone Encounter (Signed)
Copied from [place]. Topic: Quick Communication - See Telephone Encounter >> Nov 17, 2017  9:19 AM Antonieta Iba C wrote: CRM for notification. See Telephone encounter for: 11/17/17.  Pt says that she is currently in between insurance. Pt says that she doesn't have insurance to come in to be seen for medication follow up and pt is completely out of her medication. Pt would like to be advised on what she could do? Pt has been working on getting her insurance problem worked out.    CB: [phone] -

## 2017-11-17 NOTE — Telephone Encounter (Signed)
Med refilled and phone note routed to [place] to f/u with pt regarding appt/payments

## 2017-11-17 NOTE — Telephone Encounter (Signed)
Perhaps she could come in for a short visit just to do what is absolutely necessary (not a full physical) so the price is lower  Is there someone up front she could talk to re: payment plan or other help?   Please refill med until then  Thanks

## 2017-11-21 ENCOUNTER — Other Ambulatory Visit: Payer: Self-pay | Admitting: Family Medicine

## 2017-11-21 MED ORDER — AMLODIPINE BESYLATE 5 MG PO TABS: 5.0000 mg | ORAL_TABLET | Freq: Every day | ORAL | 0 refills | Status: DC

## 2017-11-21 MED ORDER — POTASSIUM CHLORIDE CRYS ER 20 MEQ PO TBCR: 20.0000 meq | EXTENDED_RELEASE_TABLET | Freq: Every day | ORAL | 0 refills | Status: DC

## 2017-11-21 NOTE — Telephone Encounter (Signed)
Rx resent to pharmacy

## 2017-11-21 NOTE — Telephone Encounter (Signed)
Copied from [place] [id number]. Topic: Quick Communication - See Telephone Encounter >> Nov 21, 2017  9:13 AM Mylinda Latina, NT wrote: CRM for notification. See Telephone encounter for: 11/21/17. Patient called and states she called the pharmacy and they did not receive her amLODipine (NORVASC) 5 MG tablet, potassium chloride SA (KLOR-CON M20) 20 MEQ tablet. Please resend  [facility] [address], [place] - [place] [phone] (Phone) [phone] (Fax)

## 2017-11-21 NOTE — Telephone Encounter (Signed)
Spoke with Cleone Slim., Pharmacy Technician, at [facility] to verify that request for medication on 11/17/17 had been received; she states that this request has not been received; will route to office for final disposition.  Potassium refill Last Refill: ? Last OV: 11/03/16 PCP: Dr Glori Bickers Pharmacy:[facility] [address] [place], [place]  Amlodipine refill Last Refill: ? Last OV: 11/03/16 PCP: Dr Glori Bickers Pharmacy:[facility] [address] [place], [place]

## 2017-11-22 NOTE — Telephone Encounter (Signed)
I spoke with pt and she said she could pay $40 up front. I reviewed several openings and she said she will call back to schedule when she can confirm a ride.

## 2017-12-29 ENCOUNTER — Telehealth: Payer: Self-pay | Admitting: Family Medicine

## 2017-12-29 NOTE — Telephone Encounter (Signed)
See prev messages/ refill request, med declined due to pt never following up with Dr. Glori Bickers, [employer] even spoke to her regarding payment options and she never f/u, I will fill if pt makes appt like Dr. Glori Bickers advised

## 2018-02-21 MED ORDER — AMLODIPINE BESYLATE 5 MG PO TABS: 5.0000 mg | ORAL_TABLET | Freq: Every day | ORAL | 0 refills | Status: DC

## 2018-02-21 NOTE — Addendum Note (Signed)
Addended by: Tammi Sou on: 02/21/2018 10:48 AM   Modules accepted: Orders

## 2018-02-21 NOTE — Telephone Encounter (Signed)
1 weeks worth of meds sent to pharmacy until her appt

## 2018-02-21 NOTE — Telephone Encounter (Signed)
Pt called office this morning and scheduled her fu with Dr.Tower for 02/27/18.

## 2018-02-27 ENCOUNTER — Ambulatory Visit: Payer: Self-pay | Admitting: Family Medicine

## 2018-02-27 ENCOUNTER — Encounter: Payer: Self-pay | Admitting: Family Medicine

## 2018-02-27 VITALS — BP 156/76 | HR 75 | Temp 98.2°F | Ht 67.5 in | Wt 230.5 lb

## 2018-02-27 DIAGNOSIS — I10 Essential (primary) hypertension: Secondary | ICD-10-CM

## 2018-02-27 DIAGNOSIS — Z6835 Body mass index (BMI) 35.0-35.9, adult: Secondary | ICD-10-CM

## 2018-02-27 DIAGNOSIS — E119 Type 2 diabetes mellitus without complications: Secondary | ICD-10-CM

## 2018-02-27 LAB — POCT GLYCOSYLATED HEMOGLOBIN (HGB A1C): Hemoglobin A1C: 6.2 % — AB (ref 4.0–5.6)

## 2018-02-27 MED ORDER — POTASSIUM CHLORIDE CRYS ER 20 MEQ PO TBCR: 20.0000 meq | EXTENDED_RELEASE_TABLET | Freq: Every day | ORAL | 3 refills | Status: DC

## 2018-02-27 MED ORDER — OMEPRAZOLE 20 MG PO CPDR: 20.0000 mg | DELAYED_RELEASE_CAPSULE | Freq: Every day | ORAL | 3 refills | Status: DC

## 2018-02-27 MED ORDER — LOSARTAN POTASSIUM 25 MG PO TABS: 25.0000 mg | ORAL_TABLET | Freq: Every day | ORAL | 11 refills | Status: DC

## 2018-02-27 MED ORDER — AMLODIPINE BESYLATE 5 MG PO TABS: 5.0000 mg | ORAL_TABLET | Freq: Every day | ORAL | 3 refills | Status: DC

## 2018-02-27 NOTE — Assessment & Plan Note (Signed)
bp is elevated BP: (!) 156/76   will add losartan 50 mg daily for renal protection (pt will fill px as soon as she has insurance after jan 1) Also continue amlodipine  Disc health habits- continue to work on wt loss

## 2018-02-27 NOTE — Assessment & Plan Note (Signed)
Discussed how this problem influences overall health and the risks it imposes  °Reviewed plan for weight loss with lower calorie diet (via better food choices and also portion control or program like weight watchers) and exercise building up to or more than 30 minutes 5 days per week including some aerobic activity  ° °Commended wt loss so far  °

## 2018-02-27 NOTE — Patient Instructions (Addendum)
Avoid drinking a lot of juice  Keep drinking water   We will schedule a nurse visit after jan 1 for flu shot and pneumonia shot also   A1C is down to 6.2 !  Keep up the good work Let's stay off metformin for now   When you get insurance- fill the px for losartan 25 mg   Take it daily for blood pressure and kidney protection  If any side effects stop it and let me know   We will call you about eye doctor appointment     Keep taking good care of yourself

## 2018-02-27 NOTE — Assessment & Plan Note (Signed)
Lab Results  Component Value Date   HGBA1C 6.2 (A) 02/27/2018   This is improved Pt took metformin 500 xr daily for 2 mo and then ran out With current imp -will stay off of it (may re start later) Disc low glycemic diet and exercise  Foot and eye care (ref to ophty after jan 1)  Will also d/c starting statin at next appt when she has coverage

## 2018-02-27 NOTE — Progress Notes (Signed)
Subjective:    Patient ID: Crystal Tucker, female    DOB: May 03, 1960, 57 y.o.   MRN: [id number]  HPI  Here for f/u of chronic medical problems  Very busy for holidays  Grand daughter is with her  Finishing divorced proceedings  Wt Readings from Last 3 Encounters:  02/27/18 230 lb 8 oz (104.6 kg)  05/19/17 239 lb 4 oz (108.5 kg)  11/03/16 243 lb (110.2 kg)  down 9 lb ! Very pleased  She started back exercising (does what she can with her R arm) -bike and floor exercises  Also cutting back portions  Drinking more water  35.57 kg/m   bp is stable today  No cp or palpitations or headaches or edema  No side effects to medicines  BP Readings from Last 3 Encounters:  02/27/18 (!) 156/76  05/19/17 140/84  11/03/16 130/80       Diabetes Home sugar results  DM diet -cutting back on junk and soda More salads and green leafy veg  Some fruit  Drinking cran-grape juice watered down  Exercise --doing better  Symptoms-none  A1C last  Lab Results  Component Value Date   HGBA1C 6.7 (H) 10/26/2016  now improved  Results for orders placed or performed in visit on 02/27/18  POCT glycosylated hemoglobin (Hb A1C)  Result Value Ref Range   Hemoglobin A1C 6.2 (A) 4.0 - 5.6 %   HbA1c POC (<> result, manual entry)     HbA1c, POC (prediabetic range)     HbA1c, POC (controlled diabetic range)       Last visit stared metformin xr 500 mg daily (ran out- she did not have insurance)  At first it gave her diarrhea-it settled down  Renal protection-none (has had cough from lisinopril in the past)  Last eye exam   Some neuropathy symptoms- plans to go to a clinic to be screened   Lab Results  Component Value Date   CHOL 176 10/26/2016   HDL 50.30 10/26/2016   LDLCALC 108 (H) 10/26/2016   TRIG 92.0 10/26/2016   CHOLHDL 4 10/26/2016   Needs flu and pneumonia vaccine after she gets her insurance jan 1  Needs eye exam   Patient Active Problem List   Diagnosis Date Noted  .  Hyperlipidemia, mild 11/04/2016  . Encounter for screening for HIV 11/03/2016  . Encounter for hepatitis C screening test for low risk patient 11/03/2016  . Encounter for screening mammogram for breast cancer 11/03/2015  . DM (diabetes mellitus), type 2 ([place]) 11/03/2015  . Herpes zoster 10/28/2015  . Benign paroxysmal positional vertigo 02/20/2013  . Obesity 11/10/2011  . Routine general medical examination at a health care facility 11/01/2011  . Allergic rhinitis 07/16/2011  . OSA (obstructive sleep apnea) 07/16/2011  . DEPENDENT EDEMA, LEGS, BILATERAL 10/11/2008  . Essential hypertension 10/26/2006  . GERD 10/26/2006  . Diverticulosis of large intestine 10/26/2006  . COLONIC POLYPS, HX OF 10/26/2006   Past Medical History:  Diagnosis Date  . Allergy   . Anemia    NOS  . Arthritis   . Benign neoplasm of colon   . Cardiac valve prolapse    leaking valve with surgery 1998  . Contact with or exposure to venereal diseases   . Diverticulosis of colon (without mention of hemorrhage)   . Edema   . GERD (gastroesophageal reflux disease)   . H/O: pneumonia   . Hypertension   . Pneumonia   . Pruritus of genital organs   .  Rash and other nonspecific skin eruption   . Sleep apnea   . Unspecified symptom associated with female genital organs    Past Surgical History:  Procedure Laterality Date  . [place]  . COLONOSCOPY  7/03   polyps and tics  . DIAGNOSTIC LAPAROSCOPY    . KNEE ARTHROSCOPY     right  . KNEE SURGERY  10/04  . LAPAROSCOPY N/A 11/12/2014   Procedure: LAPAROSCOPY DIAGNOSTIC;  Surgeon: Molli Posey, MD;  Location: [facility];  Service: Gynecology;  Laterality: N/A;  . PARTIAL HYSTERECTOMY    . POLYPECTOMY    . RECTAL POLYPECTOMY  8/07  . SALPINGOOPHORECTOMY Right 11/12/2014   Procedure: SALPINGO OOPHORECTOMY;  Surgeon: Molli Posey, MD;  Location: [facility];  Service: Gynecology;  Laterality: Right;   Social History   Tobacco Use  . Smoking  status: Former Smoker    Packs/day: 0.80    Years: 30.00    Pack years: 24.00    Types: Cigarettes    Last attempt to quit: 03/01/2002    Years since quitting: 16.0  . Smokeless tobacco: Never Used  Substance Use Topics  . Alcohol use: Yes    Alcohol/week: 0.0 standard drinks    Comment: rare  . Drug use: No   Family History  Problem Relation Age of Onset  . Heart disease Maternal Grandmother   . Cancer Paternal Aunt        type unknown  . Cerebral aneurysm Paternal Aunt   . Cerebral aneurysm Cousin   . Colon cancer Neg Hx   . Colon polyps Neg Hx   . Esophageal cancer Neg Hx   . Rectal cancer Neg Hx   . Stomach cancer Neg Hx    Allergies  Allergen Reactions  . Lisinopril     cough  . Other Rash    All citrus- lemons, oranges, grapefruit, watermelon, cantelope - all fruits with juices    Current Outpatient Medications on File Prior to Visit  Medication Sig Dispense Refill  . Acetaminophen (TYLENOL ARTHRITIS EXT RELIEF PO) Take 2 tablets by mouth daily.    Marland Kitchen albuterol (PROAIR HFA) 108 (90 Base) MCG/ACT inhaler Inhale 2 puffs into the lungs every 6 (six) hours as needed for wheezing or shortness of breath. 18 g 5  . amoxicillin (AMOXIL) 500 MG tablet Take 4 pills at one time by mouth 60 minutes before dental visit 4 tablet 3   No current facility-administered medications on file prior to visit.     Review of Systems  Constitutional: Negative for activity change, appetite change, fatigue, fever and unexpected weight change.  HENT: Negative for congestion, ear pain, rhinorrhea, sinus pressure and sore throat.   Eyes: Negative for pain, redness and visual disturbance.  Respiratory: Negative for cough, shortness of breath and wheezing.   Cardiovascular: Negative for chest pain and palpitations.  Gastrointestinal: Negative for abdominal pain, blood in stool, constipation and diarrhea.  Endocrine: Negative for polydipsia and polyuria.  Genitourinary: Negative for dysuria,  frequency and urgency.  Musculoskeletal: Negative for arthralgias, back pain and myalgias.  Skin: Negative for pallor and rash.  Allergic/Immunologic: Negative for environmental allergies.  Neurological: Negative for dizziness, syncope and headaches.  Hematological: Negative for adenopathy. Does not bruise/bleed easily.  Psychiatric/Behavioral: Negative for decreased concentration and dysphoric mood. The patient is not nervous/anxious.        Objective:   Physical Exam Constitutional:      General: She is not in acute distress.  Appearance: Normal appearance. She is well-developed. She is obese.  HENT:     Head: Normocephalic and atraumatic.     Mouth/Throat:     Mouth: Mucous membranes are moist.  Eyes:     Extraocular Movements: Extraocular movements intact.     Conjunctiva/sclera: Conjunctivae normal.     Pupils: Pupils are equal, round, and reactive to light.  Neck:     Musculoskeletal: Normal range of motion and neck supple. No muscular tenderness.     Thyroid: No thyromegaly.     Vascular: No carotid bruit or JVD.  Cardiovascular:     Rate and Rhythm: Normal rate and regular rhythm.     Heart sounds: Normal heart sounds. No gallop.   Pulmonary:     Effort: Pulmonary effort is normal. No respiratory distress.     Breath sounds: Normal breath sounds. No wheezing or rales.  Abdominal:     General: Bowel sounds are normal. There is no distension or abdominal bruit.     Palpations: Abdomen is soft. There is no mass.     Tenderness: There is no abdominal tenderness.  Lymphadenopathy:     Cervical: No cervical adenopathy.  Skin:    General: Skin is warm and dry.     Findings: No rash.  Neurological:     General: No focal deficit present.     Mental Status: She is alert.     Deep Tendon Reflexes: Reflexes normal.  Psychiatric:        Mood and Affect: Mood normal.           Assessment & Plan:   Problem List Items Addressed This Visit      Cardiovascular and  Mediastinum   Essential hypertension    bp is elevated BP: (!) 156/76   will add losartan 50 mg daily for renal protection (pt will fill px as soon as she has insurance after jan 1) Also continue amlodipine  Disc health habits- continue to work on wt loss      Relevant Medications   losartan (COZAAR) 25 MG tablet   amLODipine (NORVASC) 5 MG tablet     Endocrine   DM (diabetes mellitus), type 2 ([place]) - Primary    Lab Results  Component Value Date   HGBA1C 6.2 (A) 02/27/2018   This is improved Pt took metformin 500 xr daily for 2 mo and then ran out With current imp -will stay off of it (may re start later) Disc low glycemic diet and exercise  Foot and eye care (ref to ophty after jan 1)  Will also d/c starting statin at next appt when she has coverage       Relevant Medications   losartan (COZAAR) 25 MG tablet   Other Relevant Orders   POCT glycosylated hemoglobin (Hb A1C) (Completed)   Ambulatory referral to Ophthalmology     Other   Obesity    Discussed how this problem influences overall health and the risks it imposes  Reviewed plan for weight loss with lower calorie diet (via better food choices and also portion control or program like weight watchers) and exercise building up to or more than 30 minutes 5 days per week including some aerobic activity   Commended wt loss so far

## 2018-03-22 ENCOUNTER — Encounter: Payer: Self-pay | Admitting: Family Medicine

## 2018-03-22 ENCOUNTER — Ambulatory Visit: Payer: Self-pay

## 2018-03-22 ENCOUNTER — Ambulatory Visit: Payer: BLUE CROSS/BLUE SHIELD | Admitting: Family Medicine

## 2018-03-22 VITALS — BP 150/90 | HR 80 | Temp 103.0°F | Ht 67.5 in | Wt 227.0 lb

## 2018-03-22 DIAGNOSIS — B9789 Other viral agents as the cause of diseases classified elsewhere: Secondary | ICD-10-CM | POA: Diagnosis not present

## 2018-03-22 DIAGNOSIS — R52 Pain, unspecified: Secondary | ICD-10-CM

## 2018-03-22 DIAGNOSIS — J069 Acute upper respiratory infection, unspecified: Secondary | ICD-10-CM | POA: Diagnosis not present

## 2018-03-22 LAB — POC INFLUENZA A&B (BINAX/QUICKVUE)
Influenza A, POC: NEGATIVE
Influenza B, POC: NEGATIVE

## 2018-03-22 MED ORDER — ACETAMINOPHEN 500 MG PO TABS
500.0000 mg | ORAL_TABLET | Freq: Once | ORAL | Status: AC
  Administered 2018-03-22: 1000 mg via ORAL

## 2018-03-22 NOTE — Patient Instructions (Signed)
I think you have a flu like virus. Your flu test was negative  For nasal congestion you can use Afrin nasal spray for 3 days max, saline nasal spray (generic is fine for all). For cough you can try Delsym. Drink enough fluids to make your urine light yellow. For fever/chill/muscle aches you can take over the counter acetaminophen or ibuprofen.  Please come back in if you are not better in 5-7 days or if you develop wheezing, shortness of breath or persistent vomiting.    Viral Respiratory Infection A viral respiratory infection is an illness that affects parts of the body that are used for breathing. These include the lungs, nose, and throat. It is caused by a germ called a virus. Some examples of this kind of infection are:  A cold.  The flu (influenza).  A respiratory syncytial virus (RSV) infection. A person who gets this illness may have the following symptoms:  A stuffy or runny nose.  Yellow or green fluid in the nose.  A cough.  Sneezing.  Tiredness (fatigue).  Achy muscles.  A sore throat.  Sweating or chills.  A fever.  A headache. Follow these instructions at home: Managing pain and congestion  Take over-the-counter and prescription medicines only as told by your doctor.  If you have a sore throat, gargle with salt water. Do this 3-4 times per day or as needed. To make a salt-water mixture, dissolve -1 tsp of salt in 1 cup of warm water. Make sure that all the salt dissolves.  Use nose drops made from salt water. This helps with stuffiness (congestion). It also helps soften the skin around your nose.  Drink enough fluid to keep your pee (urine) pale yellow. General instructions   Rest as much as possible.  Do not drink alcohol.  Do not use any products that have nicotine or tobacco, such as cigarettes and e-cigarettes. If you need help quitting, ask your doctor.  Keep all follow-up visits as told by your doctor. This is important. How is this  prevented?   Get a flu shot every year. Ask your doctor when you should get your flu shot.  Do not let other people get your germs. If you are sick: ? Stay home from work or school. ? Wash your hands with soap and water often. Wash your hands after you cough or sneeze. If soap and water are not available, use hand sanitizer.  Avoid contact with people who are sick during cold and flu season. This is in fall and winter. Get help if:  Your symptoms last for 10 days or longer.  Your symptoms get worse over time.  You have a fever.  You have very bad pain in your face or forehead.  Parts of your jaw or neck become very swollen. Get help right away if:  You feel pain or pressure in your chest.  You have shortness of breath.  You faint or feel like you will faint.  You keep throwing up (vomiting).  You feel confused. Summary  A viral respiratory infection is an illness that affects parts of the body that are used for breathing.  Examples of this illness include a cold, the flu, and respiratory syncytial virus (RSV) infection.  The infection can cause a runny nose, cough, sneezing, sore throat, and fever.  Follow what your doctor tells you about taking medicines, drinking lots of fluid, washing your hands, resting at home, and avoiding people who are sick. This information is not intended  to replace advice given to you by your health care provider. Make sure you discuss any questions you have with your health care provider. Document Released: 02/05/2008 Document Revised: 04/04/2017 Document Reviewed: 04/04/2017 Elsevier Interactive Patient Education  2019 [employer].

## 2018-03-22 NOTE — Progress Notes (Signed)
Subjective:    Patient ID: Crystal Tucker, female    DOB: Jun 26, 1960, 58 y.o.   MRN: [id number]  HPI This is a 58 yo female who presents today with cough and body aches x 2 days. She was sick after Christmas with a cold/sinus but symptoms resolved. Yesterday she started having sudden onset of body aches, headache, dry cough, scratchy throat. She takes arthritis tylenol daily and had a dose around 7 am this morning. She is fatigued. No wheeze or SOB. Drinking regular ginger ale and water. Decreased appetite.  No known sick contacts.  She was given rx for losartan at last visit, she has not started this Past Medical History:  Diagnosis Date  . Allergy   . Anemia    NOS  . Arthritis   . Benign neoplasm of colon   . Cardiac valve prolapse    leaking valve with surgery 1998  . Contact with or exposure to venereal diseases   . Diverticulosis of colon (without mention of hemorrhage)   . Edema   . GERD (gastroesophageal reflux disease)   . H/O: pneumonia   . Hypertension   . Pneumonia   . Pruritus of genital organs   . Rash and other nonspecific skin eruption   . Sleep apnea   . Unspecified symptom associated with female genital organs    Past Surgical History:  Procedure Laterality Date  . [place]  . COLONOSCOPY  7/03   polyps and tics  . DIAGNOSTIC LAPAROSCOPY    . KNEE ARTHROSCOPY     right  . KNEE SURGERY  10/04  . LAPAROSCOPY N/A 11/12/2014   Procedure: LAPAROSCOPY DIAGNOSTIC;  Surgeon: Molli Posey, MD;  Location: [facility];  Service: Gynecology;  Laterality: N/A;  . PARTIAL HYSTERECTOMY    . POLYPECTOMY    . RECTAL POLYPECTOMY  8/07  . SALPINGOOPHORECTOMY Right 11/12/2014   Procedure: SALPINGO OOPHORECTOMY;  Surgeon: Molli Posey, MD;  Location: [facility];  Service: Gynecology;  Laterality: Right;   Family History  Problem Relation Age of Onset  . Heart disease Maternal Grandmother   . Cancer Paternal Aunt        type unknown  . Cerebral aneurysm  Paternal Aunt   . Cerebral aneurysm Cousin   . Colon cancer Neg Hx   . Colon polyps Neg Hx   . Esophageal cancer Neg Hx   . Rectal cancer Neg Hx   . Stomach cancer Neg Hx    Social History   Tobacco Use  . Smoking status: Former Smoker    Packs/day: 0.80    Years: 30.00    Pack years: 24.00    Types: Cigarettes    Last attempt to quit: 03/01/2002    Years since quitting: 16.0  . Smokeless tobacco: Never Used  Substance Use Topics  . Alcohol use: Yes    Alcohol/week: 0.0 standard drinks    Comment: rare  . Drug use: No      Review of Systems Per HPI    Objective:   Physical Exam Constitutional:      General: She is not in acute distress.    Appearance: She is obese. She is ill-appearing. She is not toxic-appearing or diaphoretic.  HENT:     Head: Normocephalic and atraumatic.     Nose: Nose normal.     Mouth/Throat:     Mouth: Mucous membranes are moist.     Pharynx: Oropharynx is clear.  Eyes:     Conjunctiva/sclera:  Conjunctivae normal.  Neck:     Musculoskeletal: Normal range of motion and neck supple. No neck rigidity.  Cardiovascular:     Rate and Rhythm: Normal rate and regular rhythm.     Heart sounds: Normal heart sounds.  Pulmonary:     Effort: Pulmonary effort is normal.     Breath sounds: Normal breath sounds.  Lymphadenopathy:     Cervical: No cervical adenopathy.  Skin:    General: Skin is warm and dry.  Neurological:     Mental Status: She is alert and oriented to person, place, and time.  Psychiatric:        Mood and Affect: Mood normal.        Behavior: Behavior normal.        Thought Content: Thought content normal.        Judgment: Judgment normal.       BP (!) 150/90 Comment: has not started Losartan, will start taking tomorrow  Pulse 80   Temp (!) 103 F (39.4 C) (Oral)   Ht 5' 7.5" (1.715 m)   Wt 227 lb (103 kg)   BMI 35.03 kg/m  Wt Readings from Last 3 Encounters:  03/22/18 227 lb (103 kg)  02/27/18 230 lb 8 oz (104.6  kg)  05/19/17 239 lb 4 oz (108.5 kg)   Results for orders placed or performed in visit on 03/22/18  POC Influenza A&B(BINAX/QUICKVUE)  Result Value Ref Range   Influenza A, POC Negative Negative   Influenza B, POC Negative Negative       Assessment & Plan:  1. Body aches - POC Influenza A&B(BINAX/QUICKVUE)  2. Viral URI with cough - influenza POCT negative, suspect flu like viral illness - Provided written and verbal information regarding diagnosis and treatment. - RTC/ER precautions reviewed - Acetaminophen 500 mg, 2 tabs administered in office for fever   Clarene Reamer, FNP-BC  [facility], [place]  03/22/2018 2:26 PM

## 2018-03-23 ENCOUNTER — Encounter: Payer: Self-pay | Admitting: Family Medicine

## 2018-05-30 ENCOUNTER — Ambulatory Visit: Payer: Self-pay | Admitting: Family Medicine

## 2018-05-31 ENCOUNTER — Encounter: Payer: Self-pay | Admitting: Family Medicine

## 2018-05-31 ENCOUNTER — Ambulatory Visit (INDEPENDENT_AMBULATORY_CARE_PROVIDER_SITE_OTHER): Payer: BLUE CROSS/BLUE SHIELD | Admitting: Family Medicine

## 2018-05-31 ENCOUNTER — Other Ambulatory Visit: Payer: Self-pay

## 2018-05-31 DIAGNOSIS — Z6835 Body mass index (BMI) 35.0-35.9, adult: Secondary | ICD-10-CM

## 2018-05-31 DIAGNOSIS — E119 Type 2 diabetes mellitus without complications: Secondary | ICD-10-CM

## 2018-05-31 DIAGNOSIS — E785 Hyperlipidemia, unspecified: Secondary | ICD-10-CM | POA: Diagnosis not present

## 2018-05-31 DIAGNOSIS — I10 Essential (primary) hypertension: Secondary | ICD-10-CM | POA: Diagnosis not present

## 2018-05-31 NOTE — Assessment & Plan Note (Signed)
Pt has had some light headedness since starting the low dose losartan- she will try to borrow a cuff or check in a pharmacy and let us know  If a side effect-inst her to stop it  If low bp-can hold amlodipine as well  Interested in renal protection from her losartan  Will continue to follow

## 2018-05-31 NOTE — Assessment & Plan Note (Signed)
Lab Results  Component Value Date   HGBA1C 6.2 (A) 02/27/2018   Watching diet and stable weight at home  No equip to check glucose and her ins will not pay for it  No meds at all  Feeling good  Enc further good diet and exercise  Will plan to f/u when we can see routine visits in office again  Also needs to check ins cov for eye exam and PNA vaccine

## 2018-05-31 NOTE — Assessment & Plan Note (Signed)
Enc her to continue good diet/exercise and wt loss

## 2018-05-31 NOTE — Progress Notes (Signed)
Virtual Visit via Telephone Note  I connected with Crystal Tucker on 05/31/18 at  9:00 AM EDT by telephone and verified that I am speaking with the correct person using two identifiers.   I discussed the limitations, risks, security and privacy concerns of performing an evaluation and management service by telephone and the availability of in person appointments. I also discussed with the patient that there may be a patient responsible charge related to this service. The patient expressed understanding and agreed to proceed.   History of Present Illness:  Not working currently  After a fall - had works restrictions / Warehouse manager comp  Husband let house go under foreclosure In court with ex now  She is so much better off !  Never got her flu shot-got sick and could not take it  Also did not have ins   HTN  Last visit no insurance- then was going to fill her losartan 25 She stayed light headed with the losartan   Has a cuff - but ? If it works  Also norvasc 5  She was up Monday night all night urinating  This happens every once in a while    Weight at home - went down a few lb   A little constipated      DM2 Home blood glucose readings -does not have a machine  Felt like it dropped a few times  Lab Results  Component Value Date   HGBA1C 6.2 (A) 02/27/2018  this was down last time from 6.7 Eating healthy  Also getting exercise   Renal protection losartan  Eye exam   Needs a pneumonia vaccine when she can safely go out to get one   Last cholesterol Lab Results  Component Value Date   CHOL 176 10/26/2016   HDL 50.30 10/26/2016   LDLCALC 108 (H) 10/26/2016   TRIG 92.0 10/26/2016   CHOLHDL 4 10/26/2016   Not on statin Will check this again when she can safely come in the office    ROS Review of Systems  Constitutional: Negative for fever, appetite change, fatigue and unexpected weight change.  Eyes: Negative for pain and visual disturbance.  Respiratory:  Negative for cough and shortness of breath.   Cardiovascular: Negative for cp or palpitations    Gastrointestinal: Negative for nausea, diarrhea and constipation.  Genitourinary: Negative for urgency and frequency.  Skin: Negative for pallor or rash   Neurological: Negative for weakness, , numbness and headaches.  Hematological: Negative for adenopathy. Does not bruise/bleed easily.  Psychiatric/Behavioral: Negative for dysphoric mood. The patient is less nervous/anxious.        Observations/Objective: Pt sounds cheerful/positive on the phone   Patient Active Problem List   Diagnosis Date Noted  . Hyperlipidemia, mild 11/04/2016  . Encounter for screening for HIV 11/03/2016  . Encounter for hepatitis C screening test for low risk patient 11/03/2016  . Encounter for screening mammogram for breast cancer 11/03/2015  . DM (diabetes mellitus), type 2 ([place]) 11/03/2015  . Herpes zoster 10/28/2015  . Benign paroxysmal positional vertigo 02/20/2013  . Obesity 11/10/2011  . Routine general medical examination at a health care facility 11/01/2011  . Allergic rhinitis 07/16/2011  . OSA (obstructive sleep apnea) 07/16/2011  . DEPENDENT EDEMA, LEGS, BILATERAL 10/11/2008  . Essential hypertension 10/26/2006  . GERD 10/26/2006  . Diverticulosis of large intestine 10/26/2006  . COLONIC POLYPS, HX OF 10/26/2006   Past Medical History:  Diagnosis Date  . Allergy   . Anemia  NOS  . Arthritis   . Benign neoplasm of colon   . Cardiac valve prolapse    leaking valve with surgery 1998  . Contact with or exposure to venereal diseases   . Diverticulosis of colon (without mention of hemorrhage)   . Edema   . GERD (gastroesophageal reflux disease)   . H/O: pneumonia   . Hypertension   . Pneumonia   . Pruritus of genital organs   . Rash and other nonspecific skin eruption   . Sleep apnea   . Unspecified symptom associated with female genital organs    Past Surgical History:  Procedure  Laterality Date  . [place]  . COLONOSCOPY  7/03   polyps and tics  . DIAGNOSTIC LAPAROSCOPY    . KNEE ARTHROSCOPY     right  . KNEE SURGERY  10/04  . LAPAROSCOPY N/A 11/12/2014   Procedure: LAPAROSCOPY DIAGNOSTIC;  Surgeon: Molli Posey, MD;  Location: [facility];  Service: Gynecology;  Laterality: N/A;  . PARTIAL HYSTERECTOMY    . POLYPECTOMY    . RECTAL POLYPECTOMY  8/07  . SALPINGOOPHORECTOMY Right 11/12/2014   Procedure: SALPINGO OOPHORECTOMY;  Surgeon: Molli Posey, MD;  Location: [facility];  Service: Gynecology;  Laterality: Right;   Social History   Tobacco Use  . Smoking status: Former Smoker    Packs/day: 0.80    Years: 30.00    Pack years: 24.00    Types: Cigarettes    Last attempt to quit: 03/01/2002    Years since quitting: 16.2  . Smokeless tobacco: Never Used  Substance Use Topics  . Alcohol use: Not Currently    Alcohol/week: 0.0 standard drinks    Comment: rare  . Drug use: No   Family History  Problem Relation Age of Onset  . Heart disease Maternal Grandmother   . Cancer Paternal Aunt        type unknown  . Cerebral aneurysm Paternal Aunt   . Cerebral aneurysm Cousin   . Colon cancer Neg Hx   . Colon polyps Neg Hx   . Esophageal cancer Neg Hx   . Rectal cancer Neg Hx   . Stomach cancer Neg Hx    Allergies  Allergen Reactions  . Lisinopril     cough  . Other Rash    All citrus- lemons, oranges, grapefruit, watermelon, cantelope - all fruits with juices    Current Outpatient Medications on File Prior to Visit  Medication Sig Dispense Refill  . Acetaminophen (TYLENOL ARTHRITIS EXT RELIEF PO) Take 2 tablets by mouth daily.    Marland Kitchen albuterol (PROAIR HFA) 108 (90 Base) MCG/ACT inhaler Inhale 2 puffs into the lungs every 6 (six) hours as needed for wheezing or shortness of breath. 18 g 5  . amLODipine (NORVASC) 5 MG tablet Take 1 tablet (5 mg total) by mouth daily. 90 tablet 3  . amoxicillin (AMOXIL) 500 MG tablet Take 4 pills at one  time by mouth 60 minutes before dental visit 4 tablet 3  . losartan (COZAAR) 25 MG tablet Take 1 tablet (25 mg total) by mouth daily. (Patient not taking: Reported on 03/22/2018) 30 tablet 11  . omeprazole (PRILOSEC) 20 MG capsule Take 1 capsule (20 mg total) by mouth daily. 90 capsule 3  . potassium chloride SA (KLOR-CON M20) 20 MEQ tablet Take 1 tablet (20 mEq total) by mouth daily. 90 tablet 3   No current facility-administered medications on file prior to visit.     Assessment and Plan:  Problem List Items Addressed This Visit      Cardiovascular and Mediastinum   Essential hypertension    Pt has had some light headedness since starting the low dose losartan- she will try to borrow a cuff or check in a pharmacy and let us know  If a side effect-inst her to stop it  If low bp-can hold amlodipine as well  Interested in renal protection from her losartan  Will continue to follow         Endocrine   DM (diabetes mellitus), type 2 ([place]) - Primary    Lab Results  Component Value Date   HGBA1C 6.2 (A) 02/27/2018   Watching diet and stable weight at home  No equip to check glucose and her ins will not pay for it  No meds at all  Feeling good  Enc further good diet and exercise  Will plan to f/u when we can see routine visits in office again  Also needs to check ins cov for eye exam and PNA vaccine         Other   Obesity    Enc her to continue good diet/exercise and wt loss       Hyperlipidemia, mild    Last A1C is not in DM range  She would rather avoid statin  Watching diet Will plan to check when we can safely see pt in the office and do lab           Follow Up Instructions: Check bp when you can and update [place]  We may need to adj medicines  Keep working on healthy diet /exercise      I discussed the assessment and treatment plan with the patient. The patient was provided an opportunity to ask questions and all were answered. The patient agreed with the plan and  demonstrated an understanding of the instructions.   The patient was advised to call back or seek an in-person evaluation if the symptoms worsen or if the condition fails to improve as anticipated.  I provided 16 minutes of non-face-to-face time during this encounter.   Loura Pardon, MD

## 2018-05-31 NOTE — Assessment & Plan Note (Signed)
Last A1C is not in DM range  She would rather avoid statin  Watching diet Will plan to check when we can safely see pt in the office and do lab

## 2018-06-12 ENCOUNTER — Telehealth: Payer: Self-pay | Admitting: Family Medicine

## 2018-06-12 MED ORDER — MOMETASONE FUROATE 50 MCG/ACT NA SUSP: 2.0000 | Freq: Every day | NASAL | 12 refills | Status: DC

## 2018-06-12 NOTE — Telephone Encounter (Signed)
Rx is not current medication list. Ok to refill.

## 2018-06-12 NOTE — Telephone Encounter (Signed)
Patient called and said she had a phone visit with Dr. Glori Bickers last week.  Patient said she forgot to ask about getting a rx for Nasonex.  Patient said her allergies have been really bad this year.  Patient uses [facility]

## 2018-06-12 NOTE — Telephone Encounter (Signed)
Spoken and notified patient of Dr Tower's comments. Patient verbalized understanding.  

## 2018-06-12 NOTE — Telephone Encounter (Signed)
I sent it  

## 2018-06-14 MED ORDER — FLUTICASONE PROPIONATE 50 MCG/ACT NA SUSP: 2.0000 | Freq: Every day | NASAL | 6 refills | Status: DC

## 2018-06-14 NOTE — Telephone Encounter (Signed)
I sent flonase (fluticasone)  Should be similar  If not able to get by px it is also otc

## 2018-06-14 NOTE — Telephone Encounter (Signed)
Received fax saying that the nasonex isn't covered by insurance and it would be $213 out of pocket and the pharmacy asked if you wanted to send in a different nasal spray, no alt meds listed on fax

## 2018-06-14 NOTE — Addendum Note (Signed)
Addended by: Loura Pardon A on: 06/14/2018 11:26 AM   Modules accepted: Orders

## 2018-09-15 ENCOUNTER — Telehealth: Payer: Self-pay

## 2018-09-15 NOTE — Telephone Encounter (Signed)
[facility] Night - Client TELEPHONE ADVICE RECORD [facility] Patient Name: Crystal Tucker Gender: Female DOB: Jul 11, 1960 Age: 58 Y 104 M 13 D Return Phone Number: [phone] (Primary) Address: City/State/Zip: [place] [address] Client [facility] Night - Client Client Site [place] Physician Tower, Roque Lias - MD Contact Type Call Who Is Calling Patient / Member / Family / Caregiver Call Type Triage / Clinical Relationship To Patient Self Return Phone Number [phone] (Primary) Chief Complaint OVERDOSE took too much medication at once Reason for Call Symptomatic / Request for [place] states that she took her medication twice by mistake. Additional Comment Amlodapine 5mg , Potassium 20 meq, and Omeprazole 20mg , and an Arthritis medication 60mg . Translation No Nurse Assessment Nurse: Chestine Spore, RN, [place] Date/Time (Eastern Time): 09/15/2018 8:04:04 AM Confirm and document reason for call. If symptomatic, describe symptoms. ---caller states accidently took double dose of listed meds Has the patient had close contact with a person known or suspected to have the novel coronavirus illness OR traveled / lives in area with major community spread (including international travel) in the last 14 days from the onset of symptoms? * If Asymptomatic, screen for exposure and travel within the last 14 days. ---No Does the patient have any new or worsening symptoms? ---Yes Will a triage be completed? ---Yes Related visit to physician within the last 2 weeks? ---Yes Does the PT have any chronic conditions? (i.e. diabetes, asthma, this includes High risk factors for pregnancy, etc.) ---Yes List chronic conditions. ---hypertension. arthritis Is this a behavioral health or substance abuse call? ---No Guidelines Guideline Title Affirmed Question Affirmed Notes Nurse Date/Time  (Eastern Time) Poisoning [1] DOUBLE DOSE (an extra dose or lesser amount) of prescription drug AND [2] any Chestine Spore, RN, [place] 09/15/2018 8:05:40 AM PLEASE NOTE: All timestamps contained within this report are represented as [place] Standard Time. CONFIDENTIALTY NOTICE: This fax transmission is intended only for the addressee. It contains information that is legally privileged, confidential or otherwise protected from use or disclosure. If you are not the intended recipient, you are strictly prohibited from reviewing, disclosing, copying using or disseminating any of this information or taking any action in reliance on or regarding this information. If you have received this fax in error, please notify us immediately by telephone so that we can arrange for its return to [place]. Phone: [phone], Toll-Free: [phone], Fax: [phone] Page: 2 of 2 Call Id: [id number] Guidelines Guideline Title Affirmed Question Affirmed Notes Nurse Date/Time Eilene Ghazi Time) symptoms (e.g., dizziness, nausea, pain, sleepiness) Disp. Time Eilene Ghazi Time) Disposition Final User 09/15/2018 8:00:52 AM Send to Urgent [place] Standard 09/15/2018 8:08:32 AM Call [place] Now Yes Chestine Spore, RN, [place] Disagree/Comply Comply Caller Understands Yes PreDisposition Call Doctor Care Advice Given Per Guideline [place] NOW: * You need to call the [facility] now. [place] NUMBER: * Phone number: [phone] CARE ADVICE given per Poisoning (Adult) guideline. Referrals GO TO FACILITY UNDECIDED

## 2018-09-15 NOTE — Telephone Encounter (Signed)
Pt did call Poison Control and they told her she was fine. Pt was wondering what to do about her regular dose tomorrow. I told her I thought if her numbers were not too low that she could take her regular dosing but I could ask Dr Glori Bickers. She said that is what Poison Control told her, too. She said not to bother Dr Glori Bickers. She will check her BP tomorrow morning and decide what to do.

## 2018-09-15 NOTE — Telephone Encounter (Signed)
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 Pt said poison control only concerned about amlodipine. Pt is to drink a lot of water, take BP q2h today;precautions in standing;at 8 AM BP 168/96 this was after taking morning meds twice but pt said she was scared due to taking her meds twice. Pt is feeling OK now.pt will cb if symptoms or changes in condition. FYI to Dr Glori Bickers.

## 2018-09-15 NOTE — Telephone Encounter (Signed)
I agree- if not too low go back to her regular dose Glad she is doing ok

## 2018-09-15 NOTE — Telephone Encounter (Signed)
Left VM letting pt know Dr. Tower's comments  

## 2018-10-11 DIAGNOSIS — Z111 Encounter for screening for respiratory tuberculosis: Secondary | ICD-10-CM | POA: Diagnosis not present

## 2018-11-24 ENCOUNTER — Ambulatory Visit (INDEPENDENT_AMBULATORY_CARE_PROVIDER_SITE_OTHER): Payer: BLUE CROSS/BLUE SHIELD | Admitting: Internal Medicine

## 2018-11-24 ENCOUNTER — Other Ambulatory Visit: Payer: Self-pay

## 2018-11-24 ENCOUNTER — Encounter: Payer: Self-pay | Admitting: Internal Medicine

## 2018-11-24 VITALS — Temp 98.0°F | Wt 235.0 lb

## 2018-11-24 DIAGNOSIS — K648 Other hemorrhoids: Secondary | ICD-10-CM | POA: Diagnosis not present

## 2018-11-24 MED ORDER — HYDROCORTISONE ACETATE 25 MG RE SUPP: 25.0000 mg | Freq: Two times a day (BID) | RECTAL | 0 refills | Status: DC

## 2018-11-24 NOTE — Patient Instructions (Signed)
Hemorrhoids Hemorrhoids are swollen veins that may develop:  In the butt (rectum). These are called internal hemorrhoids.  Around the opening of the butt (anus). These are called external hemorrhoids. Hemorrhoids can cause pain, itching, or bleeding. Most of the time, they do not cause serious problems. They usually get better with diet changes, lifestyle changes, and other home treatments. What are the causes? This condition may be caused by:  Having trouble pooping (constipation).  Pushing hard (straining) to poop.  Watery poop (diarrhea).  Pregnancy.  Being very overweight (obese).  Sitting for long periods of time.  Heavy lifting or other activity that causes you to strain.  Anal sex.  Riding a bike for a long period of time. What are the signs or symptoms? Symptoms of this condition include:  Pain.  Itching or soreness in the butt.  Bleeding from the butt.  Leaking poop.  Swelling in the area.  One or more lumps around the opening of your butt. How is this diagnosed? A doctor can often diagnose this condition by looking at the affected area. The doctor may also:  Do an exam that involves feeling the area with a gloved hand (digital rectal exam).  Examine the area inside your butt using a small tube (anoscope).  Order blood tests. This may be done if you have lost a lot of blood.  Have you get a test that involves looking inside the colon using a flexible tube with a camera on the end (sigmoidoscopy or colonoscopy). How is this treated? This condition can usually be treated at home. Your doctor may tell you to change what you eat, make lifestyle changes, or try home treatments. If these do not help, procedures can be done to remove the hemorrhoids or make them smaller. These may involve:  Placing rubber bands at the base of the hemorrhoids to cut off their blood supply.  Injecting medicine into the hemorrhoids to shrink them.  Shining a type of light  energy onto the hemorrhoids to cause them to fall off.  Doing surgery to remove the hemorrhoids or cut off their blood supply. Follow these instructions at home: Eating and drinking   Eat foods that have a lot of fiber in them. These include whole grains, beans, nuts, fruits, and vegetables.  Ask your doctor about taking products that have added fiber (fibersupplements).  Reduce the amount of fat in your diet. You can do this by: ? Eating low-fat dairy products. ? Eating less red meat. ? Avoiding processed foods.  Drink enough fluid to keep your pee (urine) pale yellow. Managing pain and swelling   Take a warm-water bath (sitz bath) for 20 minutes to ease pain. Do this 3-4 times a day. You may do this in a bathtub or using a portable sitz bath that fits over the toilet.  If told, put ice on the painful area. It may be helpful to use ice between your warm baths. ? Put ice in a plastic bag. ? Place a towel between your skin and the bag. ? Leave the ice on for 20 minutes, 2-3 times a day. General instructions  Take over-the-counter and prescription medicines only as told by your doctor. ? Medicated creams and medicines may be used as told.  Exercise often. Ask your doctor how much and what kind of exercise is best for you.  Go to the bathroom when you have the urge to poop. Do not wait.  Avoid pushing too hard when you poop.  Keep your   butt dry and clean. Use wet toilet paper or moist towelettes after pooping.  Do not sit on the toilet for a long time.  Keep all follow-up visits as told by your doctor. This is important. Contact a doctor if you:  Have pain and swelling that do not get better with treatment or medicine.  Have trouble pooping.  Cannot poop.  Have pain or swelling outside the area of the hemorrhoids. Get help right away if you have:  Bleeding that will not stop. Summary  Hemorrhoids are swollen veins in the butt or around the opening of the butt.   They can cause pain, itching, or bleeding.  Eat foods that have a lot of fiber in them. These include whole grains, beans, nuts, fruits, and vegetables.  Take a warm-water bath (sitz bath) for 20 minutes to ease pain. Do this 3-4 times a day. This information is not intended to replace advice given to you by your health care provider. Make sure you discuss any questions you have with your health care provider. Document Released: 12/02/2007 Document Revised: 03/02/2018 Document Reviewed: 07/14/2017 Elsevier Patient Education  2020 [employer]  

## 2018-11-24 NOTE — Progress Notes (Signed)
Subjective:    Patient ID: Crystal Tucker, female    DOB: March 13, 1960, 58 y.o.   MRN: [id number]  HPI  Pt presents to the clinic today with c/o hemorrhoids. This started 2-3 weeks ago. She reports burning and itching. She has noticed some bright red blood at times. She does not feel like she has been constipated. She had a colonoscopy 07/2015 which showed non bleeding internal hemorrhoids. She has tried OTC suppositories and creams with minimal relief.  Review of Systems      Past Medical History:  Diagnosis Date  . Allergy   . Anemia    NOS  . Arthritis   . Benign neoplasm of colon   . Cardiac valve prolapse    leaking valve with surgery 1998  . Contact with or exposure to venereal diseases   . Diverticulosis of colon (without mention of hemorrhage)   . Edema   . GERD (gastroesophageal reflux disease)   . H/O: pneumonia   . Hypertension   . Pneumonia   . Pruritus of genital organs   . Rash and other nonspecific skin eruption   . Sleep apnea   . Unspecified symptom associated with female genital organs     Current Outpatient Medications  Medication Sig Dispense Refill  . Acetaminophen (TYLENOL ARTHRITIS EXT RELIEF PO) Take 2 tablets by mouth daily.    Marland Kitchen albuterol (PROAIR HFA) 108 (90 Base) MCG/ACT inhaler Inhale 2 puffs into the lungs every 6 (six) hours as needed for wheezing or shortness of breath. 18 g 5  . amLODipine (NORVASC) 5 MG tablet Take 1 tablet (5 mg total) by mouth daily. 90 tablet 3  . amoxicillin (AMOXIL) 500 MG tablet Take 4 pills at one time by mouth 60 minutes before dental visit 4 tablet 3  . fluticasone (FLONASE) 50 MCG/ACT nasal spray Place 2 sprays into both nostrils daily. 16 g 6  . losartan (COZAAR) 25 MG tablet Take 1 tablet (25 mg total) by mouth daily. (Patient not taking: Reported on 03/22/2018) 30 tablet 11  . omeprazole (PRILOSEC) 20 MG capsule Take 1 capsule (20 mg total) by mouth daily. 90 capsule 3  . potassium chloride SA (KLOR-CON M20) 20  MEQ tablet Take 1 tablet (20 mEq total) by mouth daily. 90 tablet 3   No current facility-administered medications for this visit.     Allergies  Allergen Reactions  . Lisinopril     cough  . Other Rash    All citrus- lemons, oranges, grapefruit, watermelon, cantelope - all fruits with juices     Family History  Problem Relation Age of Onset  . Heart disease Maternal Grandmother   . Cancer Paternal Aunt        type unknown  . Cerebral aneurysm Paternal Aunt   . Cerebral aneurysm Cousin   . Colon cancer Neg Hx   . Colon polyps Neg Hx   . Esophageal cancer Neg Hx   . Rectal cancer Neg Hx   . Stomach cancer Neg Hx     Social History   Socioeconomic History  . Marital status: Married    Spouse name: Not on file  . Number of children: 1  . Years of education: Not on file  . Highest education level: Not on file  Occupational History  . 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 Financial resource strain: Not on file  . Food insecurity    Worry: Not on file  Inability: Not on file  . Transportation needs    Medical: Not on file    Non-medical: Not on file  Tobacco Use  . Smoking status: Former Smoker    Packs/day: 0.80    Years: 30.00    Pack years: 24.00    Types: Cigarettes    Quit date: 03/01/2002    Years since quitting: 16.7  . Smokeless tobacco: Never Used  Substance and Sexual Activity  . Alcohol use: Not Currently    Alcohol/week: 0.0 standard drinks    Comment: rare  . Drug use: No  . Sexual activity: Not on file  Lifestyle  . Physical activity    Days per week: Not on file    Minutes per session: Not on file  . Stress: Not on file  Relationships  . Social Herbalist on phone: Not on file    Gets together: Not on file    Attends religious service: Not on file    Active member of club or organization: Not on file    Attends meetings of clubs or organizations: Not on file    Relationship status: Not  on file  . Intimate partner violence    Fear of current or ex partner: Not on file    Emotionally abused: Not on file    Physically abused: Not on file    Forced sexual activity: Not on file  Other Topics Concern  . Not on file  Social History Narrative   Married      1 child      Ultracraft-Laid off      Works part-time home health      Caffeine: 1 daily     Constitutional: Denies fever, malaise, fatigue, headache or abrupt weight changes.  Respiratory: Denies difficulty breathing, shortness of breath, cough or sputum production.   Cardiovascular: Denies chest pain, chest tightness, palpitations or swelling in the hands or feet.  Gastrointestinal: Pt reports hemorrhoids, rectal burning, rectal itching and blood in stool. Denies abdominal pain, bloating, constipation, diarrhea.   No other specific complaints in a complete review of systems (except as listed in HPI above).  Objective:   Physical Exam  Temp 98 F (36.7 C) (Temporal)   Wt 235 lb (106.6 kg)   BMI 36.26 kg/m  Wt Readings from Last 3 Encounters:  11/24/18 235 lb (106.6 kg)  03/22/18 227 lb (103 kg)  02/27/18 230 lb 8 oz (104.6 kg)    General: Appears her stated age, obese, in NAD. Abdomen: Soft and nontender. Normal bowel sounds. No distention or masses noted.  Rectal:  Non bleeding external hemorrhoid noted. No fissure noted. Normal rectal tone. Hemoccult positive. Neurological: Alert and oriented.   BMET    Component Value Date/Time   NA 141 10/26/2016 0821   K 3.9 10/26/2016 0821   CL 105 10/26/2016 0821   CO2 31 10/26/2016 0821   GLUCOSE 122 (H) 10/26/2016 0821   BUN 12 10/26/2016 0821   CREATININE 0.76 10/26/2016 0821   CALCIUM 9.0 10/26/2016 0821   GFRNONAA >60 10/09/2015 2238   GFRAA >60 10/09/2015 2238    Lipid Panel     Component Value Date/Time   CHOL 176 10/26/2016 0821   TRIG 92.0 10/26/2016 0821   HDL 50.30 10/26/2016 0821   CHOLHDL 4 10/26/2016 0821   VLDL 18.4 10/26/2016  0821   LDLCALC 108 (H) 10/26/2016 0821    CBC    Component Value Date/Time   WBC 4.7 10/26/2016 [id number]  RBC 4.46 10/26/2016 0821   HGB 12.0 10/26/2016 0821   HCT 37.2 10/26/2016 0821   PLT 254.0 10/26/2016 0821   MCV 83.6 10/26/2016 0821   MCH 26.3 10/09/2015 2238   MCHC 32.3 10/26/2016 0821   RDW 14.5 10/26/2016 0821   LYMPHSABS 2.6 10/26/2016 0821   MONOABS 0.3 10/26/2016 0821   EOSABS 0.1 10/26/2016 0821   BASOSABS 0.1 10/26/2016 0821    Hgb A1C Lab Results  Component Value Date   HGBA1C 6.2 (A) 02/27/2018           Assessment & Plan:   Bleeding Internal Hemorrhoids:  RX for Anusol Suppositories BID x 6 days Encouraged high fiber diet with adequate water intake  Avoid straining if at all possible Use wet wipes instead of toilet paper  Return precautions discussed Webb Silversmith, NP

## 2018-11-30 ENCOUNTER — Telehealth: Payer: Self-pay | Admitting: Family Medicine

## 2018-11-30 DIAGNOSIS — K649 Unspecified hemorrhoids: Secondary | ICD-10-CM

## 2018-11-30 MED ORDER — HYDROCORTISONE (PERIANAL) 2.5 % EX CREA: 1.0000 "application " | TOPICAL_CREAM | Freq: Two times a day (BID) | CUTANEOUS | 0 refills | Status: DC

## 2018-11-30 NOTE — Telephone Encounter (Signed)
I sent a cream (instead of a suppository) so she can put it directly on the painful/itching area bid  Let me know if no improvement next week  We would consider GI referral if not improving  Try not to strain or get constipated

## 2018-11-30 NOTE — Telephone Encounter (Signed)
Patient saw Rollene Fare for hemorrhoids.  Patient wasn't was told if symptoms didn't improve, to make appointment for follow up with Dr.Tower.  Patient said she still has burning and pain.  Patient said she can't come in to see Dr.Tower tomorrow morning because she has to go to a funeral.  Patient wants to know if she needs an exam or how to proceed. Patient uses [facility].

## 2018-11-30 NOTE — Telephone Encounter (Signed)
Pt notified of Dr. Tower's comments and instructions and verbalized understanding  

## 2018-11-30 NOTE — Telephone Encounter (Signed)
Left VM requesting pt to call the office back 

## 2018-12-11 ENCOUNTER — Encounter: Payer: Self-pay | Admitting: Physician Assistant

## 2018-12-11 DIAGNOSIS — K649 Unspecified hemorrhoids: Secondary | ICD-10-CM | POA: Insufficient documentation

## 2018-12-11 NOTE — Telephone Encounter (Signed)
Patient was told to call back if she wanted to make an appointment with GI.  She has seen Dr.Nandigam. Patient said she's doing better, but she's having soreness.  She, also, said she's having trouble with her stomach. Patient said she eats and her stomach doesn't feel right. Patient saw blood clots on Saturday. Patient has diarrhea sometimes. Patient would like to be referred to Dr.Nandigam.

## 2018-12-11 NOTE — Addendum Note (Signed)
Addended by: Loura Pardon A on: 12/11/2018 10:06 AM   Modules accepted: Orders

## 2018-12-11 NOTE — Telephone Encounter (Signed)
Referral is done Will send to [facility]

## 2018-12-14 ENCOUNTER — Other Ambulatory Visit: Payer: Self-pay | Admitting: Family Medicine

## 2018-12-14 DIAGNOSIS — Z1231 Encounter for screening mammogram for malignant neoplasm of breast: Secondary | ICD-10-CM

## 2018-12-19 ENCOUNTER — Encounter: Payer: Self-pay | Admitting: *Deleted

## 2018-12-20 ENCOUNTER — Encounter: Payer: Self-pay | Admitting: Physician Assistant

## 2018-12-20 ENCOUNTER — Ambulatory Visit: Payer: BLUE CROSS/BLUE SHIELD | Admitting: Physician Assistant

## 2018-12-20 ENCOUNTER — Other Ambulatory Visit (INDEPENDENT_AMBULATORY_CARE_PROVIDER_SITE_OTHER): Payer: BLUE CROSS/BLUE SHIELD

## 2018-12-20 VITALS — BP 160/100 | HR 72 | Temp 98.5°F | Ht 67.5 in | Wt 239.5 lb

## 2018-12-20 DIAGNOSIS — R197 Diarrhea, unspecified: Secondary | ICD-10-CM | POA: Diagnosis not present

## 2018-12-20 DIAGNOSIS — R152 Fecal urgency: Secondary | ICD-10-CM

## 2018-12-20 DIAGNOSIS — K648 Other hemorrhoids: Secondary | ICD-10-CM | POA: Diagnosis not present

## 2018-12-20 DIAGNOSIS — R1030 Lower abdominal pain, unspecified: Secondary | ICD-10-CM | POA: Diagnosis not present

## 2018-12-20 LAB — CBC WITH DIFFERENTIAL/PLATELET
Basophils Absolute: 0.1 10*3/uL (ref 0.0–0.1)
Basophils Relative: 1.2 % (ref 0.0–3.0)
Eosinophils Absolute: 0.1 10*3/uL (ref 0.0–0.7)
Eosinophils Relative: 1.9 % (ref 0.0–5.0)
HCT: 37.3 % (ref 36.0–46.0)
Hemoglobin: 12.1 g/dL (ref 12.0–15.0)
Lymphocytes Relative: 43 % (ref 12.0–46.0)
Lymphs Abs: 2.7 10*3/uL (ref 0.7–4.0)
MCHC: 32.5 g/dL (ref 30.0–36.0)
MCV: 81.4 fl (ref 78.0–100.0)
Monocytes Absolute: 0.5 10*3/uL (ref 0.1–1.0)
Monocytes Relative: 7.4 % (ref 3.0–12.0)
Neutro Abs: 3 10*3/uL (ref 1.4–7.7)
Neutrophils Relative %: 46.5 % (ref 43.0–77.0)
Platelets: 271 10*3/uL (ref 150.0–400.0)
RBC: 4.58 Mil/uL (ref 3.87–5.11)
RDW: 13.7 % (ref 11.5–15.5)
WBC: 6.4 10*3/uL (ref 4.0–10.5)

## 2018-12-20 LAB — SEDIMENTATION RATE: Sed Rate: 23 mm/hr (ref 0–30)

## 2018-12-20 MED ORDER — HYDROCORTISONE ACETATE 25 MG RE SUPP: 25.0000 mg | Freq: Every day | RECTAL | 3 refills | Status: DC

## 2018-12-20 MED ORDER — HYOSCYAMINE SULFATE 0.125 MG SL SUBL: SUBLINGUAL_TABLET | SUBLINGUAL | 2 refills | Status: DC

## 2018-12-20 NOTE — Patient Instructions (Signed)
Your provider has requested that you go to the basement level for lab work before leaving today. Press "B" on the elevator. The lab is located at the first door on the left as you exit the elevator.   We have sent the following medications to your pharmacy for you to pick up at your convenience: Levsin SL 30 minutes prior to meals as needed for diarrhea, abdominal cramping Anusol HC suppositories- 1 per rectum every night x 7 nights. Repeat as needed.  If you are age 47 or older, your body mass index should be between 23-30. Your Body mass index is 36.96 kg/m. If this is out of the aforementioned range listed, please consider follow up with your Primary Care Provider.  If you are age 77 or younger, your body mass index should be between 19-25. Your Body mass index is 36.96 kg/m. If this is out of the aformentioned range listed, please consider follow up with your Primary Care Provider.

## 2018-12-20 NOTE — Progress Notes (Signed)
Subjective:    Patient ID: Crystal Tucker, female    DOB: Oct 14, 1960, 58 y.o.   MRN: [id number]  HPI [name] is a pleasant 58 year old African-American female, established with Dr. Rush Landmark who comes in today with complaints of painful hemorrhoids with recent rectal bleeding and also 16-month history of postprandial lower abdominal discomfort, urgency for bowel movements and change in bowel habits with loose malodorous stools. Patient had colonoscopy in May 2017 for history of adenomatous polyps she did have one 6 mm cecal polyp removed which was an adenoma and noted to have mild diverticulosis and small nonbleeding internal hemorrhoids.  Patient has history of hypertension sleep apnea and diabetes mellitus. She says her current symptoms started about 2 months ago with hemorrhoidal burning pain and swelling she says she was miserable for quite a while uncomfortable with sitting etc.  She tried over-the-counter preparations without much benefit and then was seen by Dr. Alba Cory office and given a prescription for Anusol [facility] suppositories.  She says though the suppositories were somewhat painful it definitely helped her symptoms.  She had been seeing intermittent blood with her bowel movements and some small clots.  She has not seen any blood over the past week. 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  She says he was to be tested for C. difficile that she believes he passed away before that was done.  She is concerned about her current symptoms being infectious.  He says around that same time is when she developed the lower abdominal discomfort after eating, crampiness some urgency for bowel movements it is mostly having all loose stools though not watery.  She is generally having a couple of loose bowel movements every morning which is quite unusual for her.  No  associated fever, no nausea No recent antibiotics or changes in medications.  Review of Systems Pertinent positive and negative review of systems were noted in the above HPI section.  All other review of systems was otherwise negative.  Outpatient Encounter Medications as of 12/20/2018  Medication Sig  . Acetaminophen (TYLENOL ARTHRITIS EXT RELIEF PO) Take 2 tablets by mouth daily.  Marland Kitchen albuterol (PROAIR HFA) 108 (90 Base) MCG/ACT inhaler Inhale 2 puffs into the lungs every 6 (six) hours as needed for wheezing or shortness of breath.  Marland Kitchen amLODipine (NORVASC) 5 MG tablet Take 1 tablet (5 mg total) by mouth daily.  . fluticasone (FLONASE) 50 MCG/ACT nasal spray Place 2 sprays into both nostrils daily.  Marland Kitchen omeprazole (PRILOSEC) 20 MG capsule Take 1 capsule (20 mg total) by mouth daily.  . potassium chloride SA (KLOR-CON M20) 20 MEQ tablet Take 1 tablet (20 mEq total) by mouth daily.  Marland Kitchen amoxicillin (AMOXIL) 500 MG tablet Take 4 pills at one time by mouth 60 minutes before dental visit (Patient not taking: Reported on 12/20/2018)  . hydrocortisone (ANUSOL-HC) 25 MG suppository Place 1 suppository (25 mg total) rectally at bedtime.  . hyoscyamine (LEVSIN SL) 0.125 MG SL tablet Dissolve 1 tablet under the tongue 30 minutes before meals as needed for diarrhea  . [DISCONTINUED] hydrocortisone (ANUSOL-HC) 2.5 % rectal cream Place 1 application rectally 2 (two) times daily.  . [DISCONTINUED] hydrocortisone (ANUSOL-HC) 25 MG suppository Place 1 suppository (25 mg total) rectally 2 (two) times daily.  . [DISCONTINUED] losartan (COZAAR) 25 MG tablet Take 1 tablet (25 mg  total) by mouth daily.   No facility-administered encounter medications on file as of 12/20/2018.    Allergies  Allergen Reactions  . Lisinopril     cough  . Other Rash    All citrus- lemons, oranges, grapefruit, watermelon, cantelope - all fruits with juices    Patient Active Problem List   Diagnosis Date Noted  . Hemorrhoids 12/11/2018   . Hyperlipidemia, mild 11/04/2016  . Encounter for screening for HIV 11/03/2016  . Encounter for hepatitis C screening test for low risk patient 11/03/2016  . Encounter for screening mammogram for breast cancer 11/03/2015  . DM (diabetes mellitus), type 2 ([place]) 11/03/2015  . Herpes zoster 10/28/2015  . Benign paroxysmal positional vertigo 02/20/2013  . Obesity 11/10/2011  . Routine general medical examination at a health care facility 11/01/2011  . Allergic rhinitis 07/16/2011  . OSA (obstructive sleep apnea) 07/16/2011  . DEPENDENT EDEMA, LEGS, BILATERAL 10/11/2008  . Essential hypertension 10/26/2006  . GERD 10/26/2006  . Diverticulosis of large intestine 10/26/2006  . COLONIC POLYPS, HX OF 10/26/2006   Social History   Socioeconomic History  . Marital status: Married    Spouse name: Not on file  . Number of children: 1  . Years of education: Not on file  . Highest education level: Not on file  Occupational History  . Occupation: caregiver  Social Needs  . Financial resource strain: Not on file  . Food insecurity    Worry: Not on file    Inability: Not on file  . Transportation needs    Medical: Not on file    Non-medical: Not on file  Tobacco Use  . Smoking status: Former Smoker    Packs/day: 0.80    Years: 30.00    Pack years: 24.00    Types: Cigarettes    Quit date: 03/01/2002    Years since quitting: 16.8  . Smokeless tobacco: Never Used  Substance and Sexual Activity  . Alcohol use: Yes    Alcohol/week: 0.0 standard drinks    Comment: rarely  . Drug use: No  . Sexual activity: Not on file  Lifestyle  . Physical activity    Days per week: Not on file    Minutes per session: Not on file  . Stress: Not on file  Relationships  . Social Herbalist on phone: Not on file    Gets together: Not on file    Attends religious service: Not on file    Active member of club or organization: Not on file    Attends meetings of clubs or organizations: Not  on file    Relationship status: Not on file  . Intimate partner violence    Fear of current or ex partner: Not on file    Emotionally abused: Not on file    Physically abused: Not on file    Forced sexual activity: Not on file  Other Topics Concern  . Not on file  Social History Narrative   Married      1 child      Ultracraft-Laid off      Works part-time home health      Caffeine: 1 daily    Ms. Imes's family history includes Cancer in her paternal aunt and paternal aunt; Cerebral aneurysm in her cousin and paternal aunt; Colon polyps in her mother; Diabetes in her maternal aunt and mother; Heart disease in her maternal aunt, maternal grandmother, and maternal uncle; Hypertension in her mother.  Objective:    Vitals:   12/20/18 1052  BP: (!) 160/100  Pulse: 72  Temp: 98.5 F (36.9 C)    Physical Exam;Well-developed well-nourished African-American female in no acute distress.  Height, Weight, 239 BMI 36.9  HEENT; nontraumatic normocephalic, EOMI, PER R LA, sclera anicteric. Oropharynx; not examined/mask/Covid Neck; supple, no JVD Cardiovascular; regular rate and rhythm with S1-S2, no murmur rub or gallop Pulmonary; Clear bilaterally Abdomen; soft, obese mild rather generalized abdominal tenderness no guarding or rebound, nondistended, no palpable mass or hepatosplenomegaly, bowel sounds are active Rectal; small external hemorrhoidal tags, no definite fissure, no palpable internal hemorrhoids, stool heme-negative Skin; benign exam, no jaundice rash or appreciable lesions Extremities; no clubbing cyanosis or edema skin warm and dry Neuro/Psych; alert and oriented x4, grossly nonfocal mood and affect appropriate       Assessment & Plan:   #70 58 year old African-American female with symptomatic internal hemorrhoids with recent exacerbation with painful swollen hemorrhoids with intermittent bleeding.  Symptoms have improved over the past couple of weeks. 2.   Recent change in bowel habits with frequent loose stools, malodorous, and postprandial lower abdominal pain/cramping and urgency. Etiology not clear, rule out infectious, 3.  History of adenomatous colon polyps-up-to-date with colonoscopy last done May 2017 due for follow-up 2022 4.  Diverticulosis 5.  Diabetes mellitus 6.  Hypertension  Plan; Check CBC with differential, sed rate, GI pathogen panel Start Anusol HC suppositories nightly x7 days then advised to repeat the course as needed for recurrent symptoms. We discussed in office banding as an option should she have recurrent issues with symptomatic internal hemorrhoids.  As she is feeling better at this point, will not pursue at present. Trial of Levsin sublingual to minutes AC.  Floraine Buechler S Geoffrey Hynes PA-C 12/20/2018   Cc: Tower, Wynelle Fanny, MD

## 2018-12-21 ENCOUNTER — Telehealth: Payer: Self-pay | Admitting: Physician Assistant

## 2018-12-21 ENCOUNTER — Other Ambulatory Visit: Payer: BLUE CROSS/BLUE SHIELD

## 2018-12-21 DIAGNOSIS — R1030 Lower abdominal pain, unspecified: Secondary | ICD-10-CM

## 2018-12-21 DIAGNOSIS — K648 Other hemorrhoids: Secondary | ICD-10-CM

## 2018-12-21 DIAGNOSIS — R152 Fecal urgency: Secondary | ICD-10-CM | POA: Diagnosis not present

## 2018-12-21 DIAGNOSIS — R197 Diarrhea, unspecified: Secondary | ICD-10-CM | POA: Diagnosis not present

## 2018-12-21 NOTE — Telephone Encounter (Signed)
Pt advised that we are unable to schedule further appointments while she had the [employer]	[place].

## 2018-12-22 LAB — GASTROINTESTINAL PATHOGEN PANEL PCR
C. difficile Tox A/B, PCR: NOT DETECTED
Campylobacter, PCR: NOT DETECTED
Cryptosporidium, PCR: NOT DETECTED
E coli (ETEC) LT/ST PCR: NOT DETECTED
E coli (STEC) stx1/stx2, PCR: NOT DETECTED
E coli 0157, PCR: NOT DETECTED
Giardia lamblia, PCR: NOT DETECTED
Norovirus, PCR: NOT DETECTED
Rotavirus A, PCR: NOT DETECTED
Salmonella, PCR: NOT DETECTED
Shigella, PCR: NOT DETECTED

## 2018-12-25 NOTE — Progress Notes (Signed)
Reviewed and agree with documentation and assessment and plan. K. Veena Lilianah Buffin , MD   

## 2019-01-19 DIAGNOSIS — Z1239 Encounter for other screening for malignant neoplasm of breast: Secondary | ICD-10-CM | POA: Diagnosis not present

## 2019-02-06 ENCOUNTER — Ambulatory Visit: Payer: Self-pay

## 2019-02-06 ENCOUNTER — Other Ambulatory Visit: Payer: Self-pay | Admitting: Family Medicine

## 2019-02-06 ENCOUNTER — Other Ambulatory Visit: Payer: Self-pay

## 2019-02-06 DIAGNOSIS — M25552 Pain in left hip: Secondary | ICD-10-CM

## 2019-02-06 DIAGNOSIS — M25512 Pain in left shoulder: Secondary | ICD-10-CM

## 2019-02-06 DIAGNOSIS — M79672 Pain in left foot: Secondary | ICD-10-CM

## 2019-02-06 DIAGNOSIS — M25572 Pain in left ankle and joints of left foot: Secondary | ICD-10-CM

## 2019-02-22 ENCOUNTER — Other Ambulatory Visit: Payer: Self-pay | Admitting: Family Medicine

## 2019-02-22 DIAGNOSIS — M25552 Pain in left hip: Secondary | ICD-10-CM | POA: Insufficient documentation

## 2019-02-23 ENCOUNTER — Other Ambulatory Visit: Payer: Self-pay | Admitting: Sports Medicine

## 2019-02-23 DIAGNOSIS — M25552 Pain in left hip: Secondary | ICD-10-CM

## 2019-02-26 ENCOUNTER — Other Ambulatory Visit: Payer: Self-pay | Admitting: Sports Medicine

## 2019-03-05 ENCOUNTER — Ambulatory Visit
Admission: RE | Admit: 2019-03-05 | Discharge: 2019-03-05 | Disposition: A | Discharge status: Home / Self Care | Payer: PRIVATE HEALTH INSURANCE | Source: Ambulatory Visit | Attending: Sports Medicine | Admitting: Sports Medicine

## 2019-03-05 DIAGNOSIS — M25552 Pain in left hip: Secondary | ICD-10-CM | POA: Diagnosis not present

## 2019-03-05 DIAGNOSIS — S79912A Unspecified injury of left hip, initial encounter: Secondary | ICD-10-CM | POA: Diagnosis not present

## 2019-05-22 ENCOUNTER — Other Ambulatory Visit: Payer: Self-pay | Admitting: Family Medicine

## 2019-05-22 NOTE — Telephone Encounter (Signed)
meds refilled once and Morey Hummingbird will reach out to pt to try and get f/u scheduled

## 2019-05-22 NOTE — Telephone Encounter (Signed)
No recent or future appts., please advise  

## 2019-05-22 NOTE — Telephone Encounter (Signed)
Please schedule f/u and refill until then  

## 2019-05-31 ENCOUNTER — Ambulatory Visit (INDEPENDENT_AMBULATORY_CARE_PROVIDER_SITE_OTHER): Payer: PRIVATE HEALTH INSURANCE | Admitting: Family Medicine

## 2019-05-31 ENCOUNTER — Encounter: Payer: Self-pay | Admitting: Family Medicine

## 2019-05-31 ENCOUNTER — Other Ambulatory Visit: Payer: Self-pay

## 2019-05-31 VITALS — BP 133/80 | HR 75 | Temp 98.2°F | Ht 67.5 in | Wt 221.5 lb

## 2019-05-31 DIAGNOSIS — E785 Hyperlipidemia, unspecified: Secondary | ICD-10-CM | POA: Diagnosis not present

## 2019-05-31 DIAGNOSIS — I10 Essential (primary) hypertension: Secondary | ICD-10-CM | POA: Diagnosis not present

## 2019-05-31 DIAGNOSIS — Z6834 Body mass index (BMI) 34.0-34.9, adult: Secondary | ICD-10-CM

## 2019-05-31 DIAGNOSIS — R7303 Prediabetes: Secondary | ICD-10-CM

## 2019-05-31 DIAGNOSIS — K219 Gastro-esophageal reflux disease without esophagitis: Secondary | ICD-10-CM

## 2019-05-31 DIAGNOSIS — E6609 Other obesity due to excess calories: Secondary | ICD-10-CM | POA: Diagnosis not present

## 2019-05-31 LAB — COMPREHENSIVE METABOLIC PANEL
ALT: 13 U/L (ref 0–35)
AST: 15 U/L (ref 0–37)
Albumin: 4 g/dL (ref 3.5–5.2)
Alkaline Phosphatase: 63 U/L (ref 39–117)
BUN: 11 mg/dL (ref 6–23)
CO2: 29 mEq/L (ref 19–32)
Calcium: 9 mg/dL (ref 8.4–10.5)
Chloride: 106 mEq/L (ref 96–112)
Creatinine, Ser: 0.8 mg/dL (ref 0.40–1.20)
GFR: 88.79 mL/min (ref >60.00)
Glucose, Bld: 90 mg/dL (ref 70–99)
Potassium: 3.9 mEq/L (ref 3.5–5.1)
Sodium: 140 mEq/L (ref 135–145)
Total Bilirubin: 0.4 mg/dL (ref 0.2–1.2)
Total Protein: 6.8 g/dL (ref 6.0–8.3)

## 2019-05-31 LAB — HEMOGLOBIN A1C: Hgb A1c MFr Bld: 6.4 % (ref 4.6–6.5)

## 2019-05-31 LAB — LIPID PANEL
Cholesterol: 195 mg/dL (ref 0–200)
HDL: 55.5 mg/dL (ref >39.00)
LDL Cholesterol: 126 mg/dL — ABNORMAL HIGH (ref 0–99)
NonHDL: 139.58
Total CHOL/HDL Ratio: 4
Triglycerides: 66 mg/dL (ref 0.0–149.0)
VLDL: 13.2 mg/dL (ref 0.0–40.0)

## 2019-05-31 LAB — CBC WITH DIFFERENTIAL/PLATELET
Basophils Absolute: 0.1 10*3/uL (ref 0.0–0.1)
Basophils Relative: 1.1 % (ref 0.0–3.0)
Eosinophils Absolute: 0.1 10*3/uL (ref 0.0–0.7)
Eosinophils Relative: 2.5 % (ref 0.0–5.0)
HCT: 36 % (ref 36.0–46.0)
Hemoglobin: 11.6 g/dL — ABNORMAL LOW (ref 12.0–15.0)
Lymphocytes Relative: 46.9 % — ABNORMAL HIGH (ref 12.0–46.0)
Lymphs Abs: 2.6 10*3/uL (ref 0.7–4.0)
MCHC: 32.4 g/dL (ref 30.0–36.0)
MCV: 81.5 fl (ref 78.0–100.0)
Monocytes Absolute: 0.5 10*3/uL (ref 0.1–1.0)
Monocytes Relative: 8.2 % (ref 3.0–12.0)
Neutro Abs: 2.3 10*3/uL (ref 1.4–7.7)
Neutrophils Relative %: 41.3 % — ABNORMAL LOW (ref 43.0–77.0)
Platelets: 282 10*3/uL (ref 150.0–400.0)
RBC: 4.41 Mil/uL (ref 3.87–5.11)
RDW: 13.8 % (ref 11.5–15.5)
WBC: 5.5 10*3/uL (ref 4.0–10.5)

## 2019-05-31 LAB — TSH: TSH: 0.87 u[IU]/mL (ref 0.35–4.50)

## 2019-05-31 MED ORDER — POTASSIUM CHLORIDE CRYS ER 20 MEQ PO TBCR: 20.0000 meq | EXTENDED_RELEASE_TABLET | Freq: Every day | ORAL | 3 refills | Status: DC

## 2019-05-31 MED ORDER — OMEPRAZOLE 20 MG PO CPDR: 20.0000 mg | DELAYED_RELEASE_CAPSULE | Freq: Every day | ORAL | 3 refills | Status: DC

## 2019-05-31 MED ORDER — AMLODIPINE BESYLATE 5 MG PO TABS: ORAL_TABLET | ORAL | 3 refills | Status: DC

## 2019-05-31 NOTE — Assessment & Plan Note (Signed)
bp in fair control at this time  BP Readings from Last 1 Encounters:  05/31/19 133/80   No changes needed Most recent labs reviewed  Disc lifstyle change with low sodium diet and exercise

## 2019-05-31 NOTE — Assessment & Plan Note (Signed)
Last A1C 6.2 -previously diabetic  No glycemic medications  Notable intentional wt loss should improve it more  Still has small amt of soda  Enc exercise  Lab today

## 2019-05-31 NOTE — Assessment & Plan Note (Signed)
Discussed how this problem influences overall health and the risks it imposes  Reviewed plan for weight loss with lower calorie diet (via better food choices and also portion control or program like weight watchers) and exercise building up to or more than 30 minutes 5 days per week including some aerobic activity   Commended on wt loss so far  Enc strongly to add exercise

## 2019-05-31 NOTE — Progress Notes (Signed)
Subjective:    Patient ID: Crystal Tucker, female    DOB: 1960-09-28, 59 y.o.   MRN: [id number]  HPI Pt presents for f/u of chronic health problems   Wt Readings from Last 3 Encounters:  05/31/19 221 lb 8 oz (100.5 kg)  12/20/18 239 lb 8 oz (108.6 kg)  11/24/18 235 lb (106.6 kg)  lost significant weight ! 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)  [place] her job right now  Is working on [employer] now    bp is stable today  No cp or palpitations or headaches or edema  No side effects to medicines  BP Readings from Last 3 Encounters:  05/31/19 133/80  12/20/18 (!) 160/100  03/22/18 (!) 150/90     Takes amlodipine 5 mg daily   Pulse Readings from Last 3 Encounters:  05/31/19 75  12/20/18 72  03/22/18 80    Prediabetes  Lab Results  Component Value Date   HGBA1C 6.2 (A) 02/27/2018   Due for labs  Wt is down   Hyperlipidemia Lab Results  Component Value Date   CHOL 176 10/26/2016   HDL 50.30 10/26/2016   LDLCALC 108 (H) 10/26/2016   TRIG 92.0 10/26/2016   CHOLHDL 4 10/26/2016   Not on a statin   Hypokalemia  Takes klor con 20 meq Some cramps in her legs    GERD Takes omeprazole 20 mg daily  Still works well   Going through a divorce and doing ok with it   Patient Active Problem List   Diagnosis Date Noted  . Hemorrhoids 12/11/2018  . Hyperlipidemia, mild 11/04/2016  . Encounter for screening for HIV 11/03/2016  . Encounter for hepatitis C screening test for low risk patient 11/03/2016  . Encounter for screening mammogram for breast cancer 11/03/2015  . Prediabetes 11/03/2015  . Herpes zoster 10/28/2015  . Benign paroxysmal positional vertigo 02/20/2013  .  Obesity 11/10/2011  . Routine general medical examination at a health care facility 11/01/2011  . Allergic rhinitis 07/16/2011  . OSA (obstructive sleep apnea) 07/16/2011  . DEPENDENT EDEMA, LEGS, BILATERAL 10/11/2008  . Essential hypertension 10/26/2006  . GERD 10/26/2006  . Diverticulosis of large intestine 10/26/2006  . COLONIC POLYPS, HX OF 10/26/2006   Past Medical History:  Diagnosis Date  . Allergy   . Anemia    NOS  . Arthritis   . Cardiac valve prolapse    leaking valve with surgery 1998  . Contact with or exposure to venereal diseases   . Diverticulosis of colon (without mention of hemorrhage)   . Edema   . GERD (gastroesophageal reflux disease)   . H/O: pneumonia   . Hypertension   . Pneumonia   . Pruritus of genital organs   . Rash and other nonspecific skin eruption   . Sleep apnea   . Tubular adenoma of colon   . Unspecified symptom associated with female genital organs    Past Surgical History:  Procedure Laterality Date  . [place]  . COLONOSCOPY  7/03   polyps and tics  . DIAGNOSTIC LAPAROSCOPY    . KNEE  ARTHROSCOPY     right  . KNEE SURGERY  10/04  . LAPAROSCOPY N/A 11/12/2014   Procedure: LAPAROSCOPY DIAGNOSTIC;  Surgeon: Molli Posey, MD;  Location: [facility];  Service: Gynecology;  Laterality: N/A;  . PARTIAL HYSTERECTOMY    . POLYPECTOMY    . RECTAL POLYPECTOMY  8/07  . ROTATOR CUFF REPAIR Right    2  . SALPINGOOPHORECTOMY Right 11/12/2014   Procedure: SALPINGO OOPHORECTOMY;  Surgeon: Molli Posey, MD;  Location: [facility];  Service: Gynecology;  Laterality: Right;  . WRIST SURGERY Right    x 2   Social History   Tobacco Use  . Smoking status: Former Smoker    Packs/day: 0.80    Years: 30.00    Pack years: 24.00    Types: Cigarettes    Quit date: 03/01/2002    Years since quitting: 17.2  . Smokeless tobacco: Never Used  Substance Use Topics  . Alcohol use: Yes    Alcohol/week: 0.0 standard drinks    Comment: rarely   . Drug use: No   Family History  Problem Relation Age of Onset  . Heart disease Maternal Grandmother   . Cancer Paternal Aunt        type unknown  . Cerebral aneurysm Paternal Aunt   . Cerebral aneurysm Cousin   . Colon polyps Mother   . Diabetes Mother   . Hypertension Mother   . Diabetes Maternal Aunt   . Heart disease Maternal Aunt   . Heart disease Maternal Uncle   . Cancer Paternal Aunt        x 2 , types unknown  . Colon cancer Neg Hx   . Esophageal cancer Neg Hx   . Rectal cancer Neg Hx   . Stomach cancer Neg Hx    Allergies  Allergen Reactions  . Lisinopril     cough  . Other Rash    All citrus- lemons, oranges, grapefruit, watermelon, cantelope - all fruits with juices  All citrus- lemons, oranges, grapefruit, watermelon, cantelope - all fruits with juices  All citrus- lemons, oranges, grapefruit, watermelon, cantelope - all fruits with juices    Current Outpatient Medications on File Prior to Visit  Medication Sig Dispense Refill  . Acetaminophen (TYLENOL ARTHRITIS EXT RELIEF PO) Take 2 tablets by mouth daily.    Marland Kitchen albuterol (PROAIR HFA) 108 (90 Base) MCG/ACT inhaler Inhale 2 puffs into the lungs every 6 (six) hours as needed for wheezing or shortness of breath. 18 g 5  . fluticasone (FLONASE) 50 MCG/ACT nasal spray Place 2 sprays into both nostrils daily. 16 g 6  . amoxicillin (AMOXIL) 500 MG tablet Take 4 pills at one time by mouth 60 minutes before dental visit (Patient not taking: Reported on 12/20/2018) 4 tablet 3  . hydrocortisone (ANUSOL-HC) 25 MG suppository Place 1 suppository (25 mg total) rectally at bedtime. (Patient not taking: Reported on 05/31/2019) 12 suppository 3   No current facility-administered medications on file prior to visit.    Review of Systems  Constitutional: Negative for activity change, appetite change, fatigue, fever and unexpected weight change.  HENT: Negative for congestion, ear pain, rhinorrhea, sinus pressure and sore throat.    Eyes: Negative for pain, redness and visual disturbance.  Respiratory: Negative for cough, shortness of breath and wheezing.   Cardiovascular: Negative for chest pain and palpitations.  Gastrointestinal: Negative for abdominal pain, blood in stool, constipation and diarrhea.  Endocrine: Negative for polydipsia and polyuria.  Genitourinary: Negative for dysuria, frequency and urgency.  Musculoskeletal: Negative for arthralgias, back pain and myalgias.  Skin: Negative for pallor and rash.  Allergic/Immunologic: Negative for environmental allergies.  Neurological: Negative for dizziness, syncope and headaches.  Hematological: Negative for adenopathy. Does not bruise/bleed easily.  Psychiatric/Behavioral: Negative for decreased concentration and dysphoric mood. The patient is not nervous/anxious.        In middle of divorce, stressful but happy to get it over with        Objective:   Physical Exam Constitutional:      General: She is not in acute distress.    Appearance: She is well-developed.  HENT:     Head: Normocephalic and atraumatic.  Eyes:     General: No scleral icterus.    Conjunctiva/sclera: Conjunctivae normal.     Pupils: Pupils are equal, round, and reactive to light.  Neck:     Thyroid: No thyromegaly.     Vascular: No carotid bruit or JVD.  Cardiovascular:     Rate and Rhythm: Normal rate and regular rhythm.     Heart sounds: Normal heart sounds. No gallop.   Pulmonary:     Effort: Pulmonary effort is normal. No respiratory distress.     Breath sounds: Normal breath sounds. No wheezing or rales.  Abdominal:     General: Bowel sounds are normal. There is no distension or abdominal bruit.     Palpations: Abdomen is soft. There is no mass.     Tenderness: There is no abdominal tenderness.  Musculoskeletal:     Cervical back: Normal range of motion and neck supple.     Right lower leg: No edema.     Left lower leg: No edema.  Lymphadenopathy:     Cervical: No  cervical adenopathy.  Skin:    General: Skin is warm and dry.     Coloration: Skin is not pale.     Findings: No rash.  Neurological:     Mental Status: She is alert.     Sensory: No sensory deficit.     Coordination: Coordination normal.     Deep Tendon Reflexes: Reflexes are normal and symmetric. Reflexes normal.  Psychiatric:        Mood and Affect: Mood normal.           Assessment & Plan:   Problem List Items Addressed This Visit      Cardiovascular and Mediastinum   Essential hypertension - Primary    bp in fair control at this time  BP Readings from Last 1 Encounters:  05/31/19 133/80   No changes needed Most recent labs reviewed  Disc lifstyle change with low sodium diet and exercise        Relevant Medications   amLODipine (NORVASC) 5 MG tablet   Other Relevant Orders   CBC with Differential/Platelet   Comprehensive metabolic panel   Lipid panel   TSH     Digestive   GERD    Does well with omeprazole which was refilled  With recent wt loss -may be able to wean off of it  She plans to try  Enc her to watch her diet       Relevant Medications   omeprazole (PRILOSEC) 20 MG capsule     Other   Obesity    Discussed how this problem influences overall health and the risks it imposes  Reviewed plan for weight loss with lower calorie diet (via better food choices and also portion control or program like weight watchers) and exercise building up to  or more than 30 minutes 5 days per week including some aerobic activity   Commended on wt loss so far  Enc strongly to add exercise        Prediabetes    Last A1C 6.2 -previously diabetic  No glycemic medications  Notable intentional wt loss should improve it more  Still has small amt of soda  Enc exercise  Lab today      Relevant Orders   Hemoglobin A1c   Hyperlipidemia, mild    Labs today  Last LDL 108 with good HDL in the 50s Disc goals for lipids and reasons to control them Rev last labs  with pt Rev low sat fat diet in detail       Relevant Medications   amLODipine (NORVASC) 5 MG tablet   Other Relevant Orders   Lipid panel

## 2019-05-31 NOTE — Assessment & Plan Note (Signed)
Does well with omeprazole which was refilled  With recent wt loss -may be able to wean off of it  She plans to try  Enc her to watch her diet

## 2019-05-31 NOTE — Assessment & Plan Note (Signed)
Labs today  Last LDL 108 with good HDL in the 50s Disc goals for lipids and reasons to control them Rev last labs with pt Rev low sat fat diet in detail

## 2019-05-31 NOTE — Patient Instructions (Signed)
Labs today  Keep working on healthy habits and diet   Add a little more exercise when you can   Get an eye exam   Take care of yourself

## 2019-06-01 ENCOUNTER — Encounter: Payer: Self-pay | Admitting: *Deleted

## 2019-09-21 ENCOUNTER — Telehealth: Payer: Self-pay

## 2019-09-21 NOTE — Telephone Encounter (Signed)
[facility] - Client TELEPHONE ADVICE RECORD [facility] Patient Name: Crystal Tucker Gender: Female DOB: 15-Oct-1960 Age: 59 Y 82 M 19 D Return Phone Number: [phone] (Primary) Address: City/State/Zip: [place] [address] Client [facility] - Client Client Site [place] MD Contact Type Call Who Is Calling Patient / Member / Family / Caregiver Call Type Triage / Clinical Relationship To Patient Self Return Phone Number [phone] (Primary) Chief Complaint Dizziness Reason for Call Symptomatic / Request for [place] states she is having severe dizziness for 2 weeks and she had one incident when she had chest tightness last week. Caller states she sits or lays down the room is spinning. Translation No Nurse Assessment Nurse: Ysidro Evert, RN, Levada Dy Date/Time (Eastern Time): 09/21/2019 12:29:31 PM Confirm and document reason for call. If symptomatic, describe symptoms. ---Caller states she is having dizziness especially when laying down it feels like the room is spinning. No other symptoms Has the patient had close contact with a person known or suspected to have the novel coronavirus illness OR traveled / lives in area with major community spread (including international travel) in the last 14 days from the onset of symptoms? * If Asymptomatic, screen for exposure and travel within the last 14 days. ---No Does the patient have any new or worsening symptoms? ---Yes Will a triage be completed? ---Yes Related visit to physician within the last 2 weeks? ---No Does the PT have any chronic conditions? (i.e. diabetes, asthma, this includes High risk factors for pregnancy, etc.) ---No Is this a behavioral health or substance abuse call? ---No Guidelines Guideline Title Affirmed Question Affirmed Notes Nurse Date/Time (Eastern Time) Dizziness -  Vertigo [1] NO dizziness now AND [2] age > 74 Ysidro Evert, RN, Levada Dy 09/21/2019 12:31:17 PM Disp. Time Eilene Ghazi Time) Disposition Final User 09/21/2019 12:39:38 PM See PCP within 24 Hours Yes Ysidro Evert, RN, Levada Dy PLEASE NOTE: All timestamps contained within this report are represented as [place] Standard Time. CONFIDENTIALTY NOTICE: This fax transmission is intended only for the addressee. It contains information that is legally privileged, confidential or otherwise protected from use or disclosure. If you are not the intended recipient, you are strictly prohibited from reviewing, disclosing, copying using or disseminating any of this information or taking any action in reliance on or regarding this information. If you have received this fax in error, please notify us immediately by telephone so that we can arrange for its return to [place]. Phone: [phone], Toll-Free: [phone], Fax: [phone] Page: 2 of 2 Call Id: [id number] [place] Disagree/Comply Comply Caller Understands Yes PreDisposition Did not know what to do Care Advice Given Per Guideline CALL BACK IF: * Severe headache occurs * Weakness develops in an arm or leg * Unable to walk without falling * Dizziness becomes constant * You become worse. CARE ADVICE given per Dizziness - Vertigo (Adult) guideline. FALL PREVENTION: * Sit at side of bed for several minutes before standing up. Stand up slowly. * Avoid sudden movements of head. SEE PCP WITHIN 24 HOURS: * IF OFFICE WILL BE OPEN: You need to be examined within the next 24 hours. Call your doctor (or NP/PA) when the office opens and make an appointment. Referrals REFERRED TO PCP OFFICE

## 2019-09-21 NOTE — Telephone Encounter (Signed)
Aware, I agree with that advisement

## 2019-09-21 NOTE — Telephone Encounter (Signed)
I spoke with pt; everytime pt lays down pt having lightheadedness for 2 wks; also gets lightheaded at other times on and off. Pt had chest tightness and mid dull chest pain that radiated to back for few mins 1 wk ago. No CP or tightness today; No H/A and no more than normal SOB. pts BP now is 172/102 not sure what P was. Prior to pt taking BP had been moving around but pt had been sitting and resting and not talking for few mins. Pt refuses to go to UC or ED. Pt wants to schedule appt. Dr Glori Bickers said ideally pt should be evaluated sooner than next wk but if pt refuses schedule appt next wk with understanding if pt condition changes or worsens will need to go to [facility] or ED. Pt voiced understanding and scheduled in office appt with Dr Glori Bickers on 09/24/19. Pt has no covid symptoms, no travel and no known exposure to + covid. FYI to Dr Damita Dunnings.

## 2019-09-24 ENCOUNTER — Other Ambulatory Visit: Payer: Self-pay

## 2019-09-24 ENCOUNTER — Ambulatory Visit (INDEPENDENT_AMBULATORY_CARE_PROVIDER_SITE_OTHER): Payer: PRIVATE HEALTH INSURANCE | Admitting: Family Medicine

## 2019-09-24 ENCOUNTER — Encounter: Payer: Self-pay | Admitting: Family Medicine

## 2019-09-24 VITALS — BP 134/72 | HR 60 | Temp 97.0°F | Ht 67.5 in | Wt 217.4 lb

## 2019-09-24 DIAGNOSIS — R42 Dizziness and giddiness: Secondary | ICD-10-CM | POA: Diagnosis not present

## 2019-09-24 DIAGNOSIS — Z9889 Other specified postprocedural states: Secondary | ICD-10-CM

## 2019-09-24 DIAGNOSIS — E6609 Other obesity due to excess calories: Secondary | ICD-10-CM

## 2019-09-24 DIAGNOSIS — Z6834 Body mass index (BMI) 34.0-34.9, adult: Secondary | ICD-10-CM

## 2019-09-24 DIAGNOSIS — R0789 Other chest pain: Secondary | ICD-10-CM

## 2019-09-24 DIAGNOSIS — E66811 Obesity, class 1: Secondary | ICD-10-CM

## 2019-09-24 DIAGNOSIS — I10 Essential (primary) hypertension: Secondary | ICD-10-CM | POA: Diagnosis not present

## 2019-09-24 NOTE — Assessment & Plan Note (Signed)
?   VSD repair in 36  Done at [facility] Pt has had some chest discomfort (pain then pressure) that is atypical Ref made to cardiology to check in  She will not have ins until august and wants to work around that [place] exam today

## 2019-09-24 NOTE — Progress Notes (Signed)
Subjective:    Patient ID: Crystal Tucker, female    DOB: 02-Aug-1960, 59 y.o.   MRN: [id number]  This visit occurred during the SARS-CoV-2 public health emergency.  Safety protocols were in place, including screening questions prior to the visit, additional usage of staff PPE, and extensive cleaning of exam room while observing appropriate contact time as indicated for disinfecting solutions.    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 Chest discomfort 09/24/2019    . Dizziness 09/24/2019  . History of heart surgery 09/24/2019  . Hemorrhoids 12/11/2018  . Hyperlipidemia, mild 11/04/2016  . Encounter for screening for HIV 11/03/2016  . Encounter for hepatitis C screening test for low risk patient 11/03/2016  . Encounter for screening mammogram for breast cancer 11/03/2015  . Prediabetes 11/03/2015  . Herpes zoster 10/28/2015  . Benign paroxysmal positional vertigo 02/20/2013  . Obesity 11/10/2011  . Routine general medical examination at a health care facility 11/01/2011  . Allergic rhinitis 07/16/2011  . OSA (obstructive sleep apnea) 07/16/2011  . DEPENDENT EDEMA, LEGS, BILATERAL 10/11/2008  . Essential hypertension 10/26/2006  . GERD 10/26/2006  . Diverticulosis of large intestine 10/26/2006  . COLONIC POLYPS, HX OF 10/26/2006   Past Medical History:  Diagnosis Date  . Allergy   . Anemia    NOS  . Arthritis   . Cardiac valve prolapse    leaking valve with surgery 1998  . Contact with or exposure to venereal diseases   . Diverticulosis of colon (without mention of hemorrhage)   . Edema   . GERD (gastroesophageal reflux disease)   . H/O: pneumonia   . Hypertension   .  Pneumonia   . Pruritus of genital organs   . Rash and other nonspecific skin eruption   . Sleep apnea   . Tubular adenoma of colon   . Unspecified symptom associated with female genital organs    Past Surgical History:  Procedure Laterality Date  . [place]  . COLONOSCOPY  7/03   polyps and tics  . DIAGNOSTIC LAPAROSCOPY    . KNEE ARTHROSCOPY     right  . KNEE SURGERY  10/04  . LAPAROSCOPY N/A 11/12/2014   Procedure: LAPAROSCOPY DIAGNOSTIC;  Surgeon: Molli Posey, MD;  Location: [facility];  Service: Gynecology;  Laterality: N/A;  . PARTIAL HYSTERECTOMY    . POLYPECTOMY    . RECTAL POLYPECTOMY  8/07  . ROTATOR CUFF REPAIR Right    2  . SALPINGOOPHORECTOMY Right 11/12/2014   Procedure: SALPINGO OOPHORECTOMY;  Surgeon: Molli Posey, MD;   Location: [facility];  Service: Gynecology;  Laterality: Right;  . WRIST SURGERY Right    x 2   Social History   Tobacco Use  . Smoking status: Former Smoker    Packs/day: 0.80    Years: 30.00    Pack years: 24.00    Types: Cigarettes    Quit date: 03/01/2002    Years since quitting: 17.5  . Smokeless tobacco: Never Used  Substance Use Topics  . Alcohol use: Yes    Alcohol/week: 0.0 standard drinks    Comment: rarely  . Drug use: No   Family History  Problem Relation Age of Onset  . Heart disease Maternal Grandmother   . Cancer Paternal Aunt        type unknown  . Cerebral aneurysm Paternal Aunt   . Cerebral aneurysm Cousin   . Colon polyps Mother   . Diabetes Mother   . Hypertension Mother   . Diabetes Maternal Aunt   . Heart disease Maternal Aunt   . Heart disease Maternal Uncle   . Cancer Paternal Aunt        x 2 , types unknown  . Colon cancer Neg Hx   . Esophageal cancer Neg Hx   . Rectal cancer Neg Hx   . Stomach cancer Neg Hx    Allergies  Allergen Reactions  . Lisinopril     cough  . Other Rash    All citrus- lemons, oranges, grapefruit, watermelon, cantelope - all fruits with juices  All citrus- lemons, oranges, grapefruit, watermelon, cantelope - all fruits with juices  All citrus- lemons, oranges, grapefruit, watermelon, cantelope - all fruits with juices    Current Outpatient Medications on File Prior to Visit  Medication Sig Dispense Refill  . Acetaminophen (TYLENOL ARTHRITIS EXT RELIEF PO) Take 2 tablets by mouth daily.    Marland Kitchen albuterol (PROAIR HFA) 108 (90 Base) MCG/ACT inhaler Inhale 2 puffs into the lungs every 6 (six) hours as needed for wheezing or shortness of breath. 18 g 5  . amLODipine (NORVASC) 5 MG tablet TAKE 1 TABLET BY MOUTH ONCE DAILY 90 tablet 3  . fluticasone (FLONASE) 50 MCG/ACT nasal spray Place 2 sprays into both nostrils daily. 16 g 6  . omeprazole (PRILOSEC) 20 MG capsule Take 1 capsule (20 mg total) by mouth daily. 90 capsule 3    . potassium chloride SA (KLOR-CON) 20 MEQ tablet Take 1 tablet (20 mEq total) by mouth daily. 90 tablet 3  . amoxicillin (AMOXIL) 500 MG tablet Take 4 pills at one time by mouth 60 minutes before dental visit (Patient  not taking: Reported on 12/20/2018) 4 tablet 3  . hydrocortisone (ANUSOL-HC) 25 MG suppository Place 1 suppository (25 mg total) rectally at bedtime. (Patient not taking: Reported on 05/31/2019) 12 suppository 3   No current facility-administered medications on file prior to visit.    Review of Systems  Constitutional: Negative for activity change, appetite change, fatigue, fever and unexpected weight change.  HENT: Negative for congestion, ear pain, rhinorrhea, sinus pressure and sore throat.   Eyes: Negative for pain, redness and visual disturbance.  Respiratory: Negative for cough, shortness of breath and wheezing.   Cardiovascular: Positive for chest pain. Negative for palpitations and leg swelling.  Gastrointestinal: Negative for abdominal pain, blood in stool, constipation and diarrhea.  Endocrine: Negative for polydipsia and polyuria.  Genitourinary: Negative for dysuria, frequency and urgency.  Musculoskeletal: Negative for arthralgias, back pain and myalgias.  Skin: Negative for pallor and rash.  Allergic/Immunologic: Negative for environmental allergies.  Neurological: Positive for dizziness and light-headedness. Negative for tremors, seizures, syncope, facial asymmetry, speech difficulty, weakness, numbness and headaches.  Hematological: Negative for adenopathy. Does not bruise/bleed easily.  Psychiatric/Behavioral: Negative for decreased concentration and dysphoric mood. The patient is not nervous/anxious.        Objective:   Physical Exam Constitutional:      General: She is not in acute distress.    Appearance: Normal appearance. She is well-developed. She is obese. She is not ill-appearing or diaphoretic.  HENT:     Head: Normocephalic and atraumatic.      Right Ear: Tympanic membrane, ear canal and external ear normal.     Left Ear: Tympanic membrane, ear canal and external ear normal.     Nose: Congestion present.     Mouth/Throat:     Mouth: Mucous membranes are moist.     Pharynx: No oropharyngeal exudate.  Eyes:     General: No scleral icterus.       Right eye: No discharge.        Left eye: No discharge.     Conjunctiva/sclera: Conjunctivae normal.     Pupils: Pupils are equal, round, and reactive to light.     Comments: 1-2 beats of horizontal nystagmus  Neck:     Thyroid: No thyromegaly.     Vascular: No carotid bruit or JVD.     Trachea: No tracheal deviation.  Cardiovascular:     Rate and Rhythm: Normal rate and regular rhythm.     Heart sounds: Normal heart sounds. No murmur heard.  No gallop.   Pulmonary:     Effort: Pulmonary effort is normal. No respiratory distress.     Breath sounds: Normal breath sounds. No wheezing or rales.  Abdominal:     General: Bowel sounds are normal. There is no distension or abdominal bruit.     Palpations: Abdomen is soft. There is no mass.     Tenderness: There is no abdominal tenderness.  Musculoskeletal:        General: No tenderness.     Cervical back: Full passive range of motion without pain, normal range of motion and neck supple.  Lymphadenopathy:     Cervical: No cervical adenopathy.  Skin:    General: Skin is warm and dry.     Coloration: Skin is not pale.     Findings: No rash.  Neurological:     Mental Status: She is alert and oriented to person, place, and time.     Cranial Nerves: No cranial nerve deficit.     Sensory:  No sensory deficit.     Motor: No tremor, atrophy or abnormal muscle tone.     Coordination: Coordination normal.     Gait: Gait normal.     Deep Tendon Reflexes: Reflexes are normal and symmetric.     Comments: No focal cerebellar signs   Dizziness worsens when pt rotates head and nods Worse to lie down as well  Psychiatric:        Behavior:  Behavior normal.        Thought Content: Thought content normal.           Assessment & Plan:   Problem List Items Addressed This Visit      Cardiovascular and Mediastinum   Essential hypertension    bp in fair control at this time  BP Readings from Last 1 Encounters:  09/24/19 134/72   No changes needed Most recent labs reviewed  Disc lifstyle change with low sodium diet and exercise          Other   Obesity    Commended on wt loss as pt continues to make progress      Chest discomfort - Primary    Atypical  Disc poss of GI/reflux(takes ppi) Pt has had heart surg (? VSD) in past and needs f/u with cardiology (would like to see someone [facility]) Reassuring ekg-T wave inv in precord waves may be baseline but cannot pull up old studies in epic      Relevant Orders   EKG 12-Lead (Completed)   Ambulatory referral to Cardiology   Dizziness    This is positional -worse to move from sitting to lying down  Also some with head position change  Disc poss of vertigo  Will continue to change position slowly and get back to using flonase for chronic sinus congestion daily instead of prn  bp /pulse are stable  Will watch this  Has also had cp - unclear if related but does not happen at same time      Relevant Orders   Ambulatory referral to Cardiology   History of heart surgery    ? VSD repair in 36  Done at [facility] Pt has had some chest discomfort (pain then pressure) that is atypical Ref made to cardiology to check in  She will not have ins until august and wants to work around that Reassuring exam today      Relevant Orders   Ambulatory referral to Cardiology

## 2019-09-24 NOTE — Assessment & Plan Note (Signed)
bp in fair control at this time  BP Readings from Last 1 Encounters:  09/24/19 134/72   No changes needed Most recent labs reviewed  Disc lifstyle change with low sodium diet and exercise

## 2019-09-24 NOTE — Patient Instructions (Addendum)
Keep well hydrated   Increase flonase to every single day- if you have congestion/ear related dizziness this should help  Change position slowly  Keep hydrated as well  If symptoms change or worsen let me know  I want to refer you to cardiology for a check in regarding history of heart surgery and new chest discomfort The office will call you to set that up    If chest pain gets more severe-go to ER  Please (or if suddenly short of breath or other new symptoms)

## 2019-09-24 NOTE — Assessment & Plan Note (Signed)
This is positional -worse to move from sitting to lying down  Also some with head position change  Disc poss of vertigo  Will continue to change position slowly and get back to using flonase for chronic sinus congestion daily instead of prn  bp /pulse are stable  Will watch this  Has also had cp - unclear if related but does not happen at same time

## 2019-09-24 NOTE — Assessment & Plan Note (Signed)
Commended on wt loss as pt continues to make progress

## 2019-09-24 NOTE — Assessment & Plan Note (Signed)
Atypical  Disc poss of GI/reflux(takes ppi) Pt has had heart surg (? VSD) in past and needs f/u with cardiology (would like to see someone [facility]) Reassuring ekg-T wave inv in precord waves may be baseline but cannot pull up old studies in epic

## 2019-10-11 ENCOUNTER — Ambulatory Visit: Payer: PRIVATE HEALTH INSURANCE | Admitting: Cardiovascular Disease

## 2019-10-11 ENCOUNTER — Other Ambulatory Visit: Payer: Self-pay

## 2019-10-11 ENCOUNTER — Ambulatory Visit (INDEPENDENT_AMBULATORY_CARE_PROVIDER_SITE_OTHER): Payer: PRIVATE HEALTH INSURANCE | Admitting: Cardiovascular Disease

## 2019-10-11 ENCOUNTER — Ambulatory Visit: Payer: PRIVATE HEALTH INSURANCE | Admitting: Cardiology

## 2019-10-11 ENCOUNTER — Encounter: Payer: Self-pay | Admitting: Cardiovascular Disease

## 2019-10-11 ENCOUNTER — Telehealth: Payer: Self-pay | Admitting: Family Medicine

## 2019-10-11 VITALS — BP 142/82 | HR 62 | Ht 67.0 in | Wt 219.0 lb

## 2019-10-11 DIAGNOSIS — Q211 Atrial septal defect, unspecified: Secondary | ICD-10-CM

## 2019-10-11 DIAGNOSIS — I10 Essential (primary) hypertension: Secondary | ICD-10-CM

## 2019-10-11 NOTE — Telephone Encounter (Signed)
Great, thanks for letting me know 

## 2019-10-11 NOTE — Telephone Encounter (Signed)
Patient called office stating Heart doctor she was referred to called needing to cancel and reschedule her appointment to see him. She said since she's in the process of changing jobs and she has only 30 days to use her insurance and doesn't think she will be able to see him. Patient isn't sure how Dr.Tower feels about this and would like you to give her a call at [phone] with any questions, concerns or suggestions.

## 2019-10-11 NOTE — Telephone Encounter (Signed)
Patient called back stating she got a call back from the office and they were able to find her another provider in the same office today at 11.

## 2019-10-11 NOTE — Progress Notes (Deleted)
Cardiology Office Note   Date:  10/11/2019   ID:  Crystal Tucker, DOB Nov 10, 1960, MRN [id number]  PCP:  Abner Greenspan, MD  Cardiologist:   No primary care provider on file. Referring:  ***  No chief complaint on file.     History of Present Illness: Crystal Tucker is a 59 y.o. female who presents for ***     Past Medical History:  Diagnosis Date  . Allergy   . Anemia    NOS  . Arthritis   . Cardiac valve prolapse    leaking valve with surgery 1998  . Contact with or exposure to venereal diseases   . Diverticulosis of colon (without mention of hemorrhage)   . Edema   . GERD (gastroesophageal reflux disease)   . H/O: pneumonia   . Hypertension   . Pneumonia   . Pruritus of genital organs   . Rash and other nonspecific skin eruption   . Sleep apnea   . Tubular adenoma of colon   . Unspecified symptom associated with female genital organs     Past Surgical History:  Procedure Laterality Date  . [place]  . COLONOSCOPY  7/03   polyps and tics  . DIAGNOSTIC LAPAROSCOPY    . KNEE ARTHROSCOPY     right  . KNEE SURGERY  10/04  . LAPAROSCOPY N/A 11/12/2014   Procedure: LAPAROSCOPY DIAGNOSTIC;  Surgeon: Molli Posey, MD;  Location: [facility];  Service: Gynecology;  Laterality: N/A;  . PARTIAL HYSTERECTOMY    . POLYPECTOMY    . RECTAL POLYPECTOMY  8/07  . ROTATOR CUFF REPAIR Right    2  . SALPINGOOPHORECTOMY Right 11/12/2014   Procedure: SALPINGO OOPHORECTOMY;  Surgeon: Molli Posey, MD;  Location: [facility];  Service: Gynecology;  Laterality: Right;  . WRIST SURGERY Right    x 2     Current Outpatient Medications  Medication Sig Dispense Refill  . Acetaminophen (TYLENOL ARTHRITIS EXT RELIEF PO) Take 2 tablets by mouth daily.    Marland Kitchen albuterol (PROAIR HFA) 108 (90 Base) MCG/ACT inhaler Inhale 2 puffs into the lungs every 6 (six) hours as needed for wheezing or shortness of breath. 18 g 5  . amLODipine (NORVASC) 5 MG tablet TAKE 1 TABLET BY  MOUTH ONCE DAILY 90 tablet 3  . amoxicillin (AMOXIL) 500 MG tablet Take 4 pills at one time by mouth 60 minutes before dental visit (Patient not taking: Reported on 12/20/2018) 4 tablet 3  . fluticasone (FLONASE) 50 MCG/ACT nasal spray Place 2 sprays into both nostrils daily. 16 g 6  . hydrocortisone (ANUSOL-HC) 25 MG suppository Place 1 suppository (25 mg total) rectally at bedtime. (Patient not taking: Reported on 05/31/2019) 12 suppository 3  . omeprazole (PRILOSEC) 20 MG capsule Take 1 capsule (20 mg total) by mouth daily. 90 capsule 3  . potassium chloride SA (KLOR-CON) 20 MEQ tablet Take 1 tablet (20 mEq total) by mouth daily. 90 tablet 3   No current facility-administered medications for this visit.    Allergies:   Lisinopril and Other    Social History:  The patient  reports that she quit smoking about 17 years ago. Her smoking use included cigarettes. She has a 24.00 pack-year smoking history. She has never used smokeless tobacco. She reports current alcohol use. She reports that she does not use drugs.   Family History:  The patient's ***family history includes Cancer in her paternal aunt and paternal aunt; Cerebral aneurysm in her  cousin and paternal aunt; Colon polyps in her mother; Diabetes in her maternal aunt and mother; Heart disease in her maternal aunt, maternal grandmother, and maternal uncle; Hypertension in her mother.    ROS:  Please see the history of present illness.   Otherwise, review of systems are positive for {NONE DEFAULTED:18576::"none"}.   All other systems are reviewed and negative.    PHYSICAL EXAM: VS:  There were no vitals taken for this visit. , BMI There is no height or weight on file to calculate BMI. GENERAL:  Well appearing HEENT:  Pupils equal round and reactive, fundi not visualized, oral mucosa unremarkable NECK:  No jugular venous distention, waveform within normal limits, carotid upstroke brisk and symmetric, no bruits, no thyromegaly LYMPHATICS:   No cervical, inguinal adenopathy LUNGS:  Clear to auscultation bilaterally BACK:  No CVA tenderness CHEST:  Unremarkable HEART:  PMI not displaced or sustained,S1 and S2 within normal limits, no S3, no S4, no clicks, no rubs, *** murmurs ABD:  Flat, positive bowel sounds normal in frequency in pitch, no bruits, no rebound, no guarding, no midline pulsatile mass, no hepatomegaly, no splenomegaly EXT:  2 plus pulses throughout, no edema, no cyanosis no clubbing SKIN:  No rashes no nodules NEURO:  Cranial nerves II through XII grossly intact, motor grossly intact throughout PSYCH:  Cognitively intact, oriented to person place and time    EKG:  EKG {ACTION; IS/IS IRS:[id number]} ordered today. The ekg ordered today demonstrates ***   Recent Labs: 05/31/2019: ALT 13; BUN 11; Creatinine, Ser 0.80; Hemoglobin 11.6; Platelets 282.0; Potassium 3.9; Sodium 140; TSH 0.87    Lipid Panel    Component Value Date/Time   CHOL 195 05/31/2019 1222   TRIG 66.0 05/31/2019 1222   HDL 55.50 05/31/2019 1222   CHOLHDL 4 05/31/2019 1222   VLDL 13.2 05/31/2019 1222   LDLCALC 126 (H) 05/31/2019 1222      Wt Readings from Last 3 Encounters:  09/24/19 217 lb 7 oz (98.6 kg)  05/31/19 221 lb 8 oz (100.5 kg)  12/20/18 239 lb 8 oz (108.6 kg)      Other studies Reviewed: Additional studies/ records that were reviewed today include: ***. Review of the above records demonstrates:  Please see elsewhere in the note.  ***   ASSESSMENT AND PLAN:  ***   Current medicines are reviewed at length with the patient today.  The patient {ACTIONS; HAS/DOES NOT HAVE:19233} concerns regarding medicines.  The following changes have been made:  {PLAN; NO CHANGE:13088:s}  Labs/ tests ordered today include: *** No orders of the defined types were placed in this encounter.    Disposition:   FU with ***    Signed, Minus Breeding, MD  10/11/2019 9:04 AM    [place]

## 2019-10-11 NOTE — Progress Notes (Signed)
Cardiology Office Note:    Date:  10/14/2019   ID:  Crystal Tucker, DOB 1961-02-14, MRN [id number]  PCP:  Abner Greenspan, MD  [place] Cardiologist:  No primary care provider on file.  [facility] Electrophysiologist:  None   Referring MD: Abner Greenspan, MD   Chief Complaint  Patient presents with   Chest Pain  Crystal Tucker is a 59 y.o. female who is being seen today for the evaluation of chest pain at the request of Tower, Wynelle Fanny, MD.   History of Present Illness:    Crystal Tucker is a 59 y.o. female with a hx of multiple coronary risk factors including essential hypertension, prediabetes, perimenopausal state and remote history of smoking as well as a history of atrial septal defect surgical patch repair in 1998.  She presents with complaints of central chest discomfort that occurred while driving, lasting for several hours, this first happened about 6 weeks ago and a shorter episode happened a couple of weeks later.  He did not associate shortness of breath or diaphoresis or other major complaints.  It was not pleuritic in nature was described as heaviness.  She is also had some problems with recent dizziness, not necessarily associated with the chest discomfort.  She denies leg edema, syncope, claudication or focal neurological complaints.  At her presentation 1998 the major complaint was that of random unpredictable dizzy spells.  She underwent a transesophageal echo that showed atrial septal defect and right ventricular enlargement.  After surgery her dizzy spells resolved.  She has had one recent dizzy spell that was reminiscent of her preoperative symptoms in the last 6 months.  She has been struggling with obesity but has managed to lose about 20 pounds in the last few months.  She was prescribed Metformin for prediabetes but did not like the way it made her feel so she is trying to exercise more and control her diet better.  Her most recent hemoglobin A1c was 6.4%  in March 2021.  She has mild hypercholesterolemia with an LDL cholesterol of 126.  She quit smoking in 2003.  Her mother had hypertension and prediabetes.  There is no family history of coronary disease.  She is currently perimenopausal.  She has previously had hysterectomy and a unilateral oophorectomy.  She has been having hot flashes for the last 4 years or so.  Past Medical History:  Diagnosis Date   Allergy    Anemia    NOS   Arthritis    Cardiac valve prolapse    leaking valve with surgery 1998   Contact with or exposure to venereal diseases    Diverticulosis of colon (without mention of hemorrhage)    Edema    GERD (gastroesophageal reflux disease)    H/O: pneumonia    Hypertension    Pneumonia    Pruritus of genital organs    Rash and other nonspecific skin eruption    Sleep apnea    Tubular adenoma of colon    Unspecified symptom associated with female genital organs     Past Surgical History:  Procedure Laterality Date   CARDIAC VALVE SURGERY  1998   COLONOSCOPY  7/03   polyps and tics   DIAGNOSTIC LAPAROSCOPY     KNEE ARTHROSCOPY     right   KNEE SURGERY  10/04   LAPAROSCOPY N/A 11/12/2014   Procedure: LAPAROSCOPY DIAGNOSTIC;  Surgeon: Molli Posey, MD;  Location: [facility];  Service: Gynecology;  Laterality: N/A;  PARTIAL HYSTERECTOMY     POLYPECTOMY     RECTAL POLYPECTOMY  8/07   ROTATOR CUFF REPAIR Right    2   SALPINGOOPHORECTOMY Right 11/12/2014   Procedure: SALPINGO OOPHORECTOMY;  Surgeon: Molli Posey, MD;  Location: [facility];  Service: Gynecology;  Laterality: Right;   WRIST SURGERY Right    x 2    Current Medications: Current Meds  Medication Sig   Acetaminophen (TYLENOL ARTHRITIS EXT RELIEF PO) Take 2 tablets by mouth daily.   albuterol (PROAIR HFA) 108 (90 Base) MCG/ACT inhaler Inhale 2 puffs into the lungs every 6 (six) hours as needed for wheezing or shortness of breath.   amLODipine (NORVASC) 5 MG tablet TAKE 1  TABLET BY MOUTH ONCE DAILY   amoxicillin (AMOXIL) 500 MG tablet Take 4 pills at one time by mouth 60 minutes before dental visit   fluticasone (FLONASE) 50 MCG/ACT nasal spray Place 2 sprays into both nostrils daily.   hydrocortisone (ANUSOL-HC) 25 MG suppository Place 1 suppository (25 mg total) rectally at bedtime.   omeprazole (PRILOSEC) 20 MG capsule Take 1 capsule (20 mg total) by mouth daily.   potassium chloride SA (KLOR-CON) 20 MEQ tablet Take 1 tablet (20 mEq total) by mouth daily.     Allergies:   Lisinopril and Other   Social History   Socioeconomic History   Marital status: Married    Spouse name: Not on file   Number of children: 1   Years of education: Not on file   Highest education level: Not on file  Occupational History   Occupation: caregiver  Tobacco Use   Smoking status: Former Smoker    Packs/day: 0.80    Years: 30.00    Pack years: 24.00    Types: Cigarettes    Quit date: 03/01/2002    Years since quitting: 17.6   Smokeless tobacco: Never Used  Substance and Sexual Activity   Alcohol use: Yes    Alcohol/week: 0.0 standard drinks    Comment: rarely   Drug use: No   Sexual activity: Not on file  Other Topics Concern   Not on file  Social History Narrative   Married      1 child      Ultracraft-Laid off      Works part-time home health      Caffeine: 1 daily   Social Determinants of Radio broadcast assistant Strain:    Difficulty of Paying Living Expenses:   Food Insecurity:    Worried About Charity fundraiser in the Last Year:    Arboriculturist in the Last Year:   Transportation Needs:    Film/video editor (Medical):    Lack of Transportation (Non-Medical):   Physical Activity:    Days of Exercise per Week:    Minutes of Exercise per Session:   Stress:    Feeling of Stress :   Social Connections:    Frequency of Communication with Friends and Family:    Frequency of Social Gatherings with Friends  and Family:    Attends Religious Services:    Active Member of Clubs or Organizations:    Attends Music therapist:    Marital Status:      Family History: The patient's family history includes Cancer in her paternal aunt and paternal aunt; Cerebral aneurysm in her cousin and paternal aunt; Colon polyps in her mother; Diabetes in her maternal aunt and mother; Heart disease in her maternal aunt, maternal grandmother, and  maternal uncle; Hypertension in her mother. There is no history of Colon cancer, Esophageal cancer, Rectal cancer, or Stomach cancer.  ROS:   Please see the history of present illness.    All other systems reviewed and are negative.  EKGs/Labs/Other Studies Reviewed:    The following studies were reviewed today: Echocardiogram August 08, 1996 shows normal left ventricular function, dilated right heart with mild right ventricular hypokinesis, intact atrial septal defect patch, estimated systolic PA pressure 30 mmHg mild tricuspid regurgitation.  EKG:  EKG is ordered today.  The ekg ordered today demonstrates normal sinus rhythm with T wave inversion V1-V2, otherwise normal, borderline QTC 472 ms.  Recent Labs: 05/31/2019: ALT 13; BUN 11; Creatinine, Ser 0.80; Hemoglobin 11.6; Platelets 282.0; Potassium 3.9; Sodium 140; TSH 0.87  Recent Lipid Panel    Component Value Date/Time   CHOL 195 05/31/2019 1222   TRIG 66.0 05/31/2019 1222   HDL 55.50 05/31/2019 1222   CHOLHDL 4 05/31/2019 1222   VLDL 13.2 05/31/2019 1222   LDLCALC 126 (H) 05/31/2019 1222    Physical Exam:    VS:  BP (!) 142/82    Pulse 62    Ht 5\' 7"  (1.702 m)    Wt 219 lb (99.3 kg)    SpO2 99%    BMI 34.30 kg/m     Wt Readings from Last 3 Encounters:  10/11/19 219 lb (99.3 kg)  09/24/19 217 lb 7 oz (98.6 kg)  05/31/19 221 lb 8 oz (100.5 kg)     GEN: Obese, well nourished, well developed in no acute distress HEENT: Normal NECK: No JVD; No carotid bruits LYMPHATICS: No  lymphadenopathy CARDIAC: RRR, no murmurs, rubs, gallops RESPIRATORY:  Clear to auscultation without rales, wheezing or rhonchi  ABDOMEN: Soft, non-tender, non-distended MUSCULOSKELETAL:  No edema; No deformity  SKIN: Warm and dry NEUROLOGIC:  Alert and oriented x 3 PSYCHIATRIC:  Normal affect   ASSESSMENT:    1. Atrial septal defect   2. Essential hypertension    PLAN:    In order of problems listed above:  1. Chest pain: Occurred at rest, but has some features concerning for coronary etiology.  She has several coronary risk factors.  We will schedule for coronary CT angiogram.  Will need metoprolol before the study.  Also will require premedication for history of contrast allergy. 2. Hx ASD repair: No reason to suspect residual ASD by physical exam.  Repeat echocardiogram may be indicated. 3. HTN: Borderline control. 4. PreDM: Making progress with weight loss and dietary changes.  Recheck hemoglobin A1c 5. HLP: Target LDL cholesterol less than 100 (less than 70 if we identify significant coronary atherosclerosis).  She might require medications to achieve this goal.  Recheck lipid profile. 6. 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  The spells are very infrequent and will be hard to catch with a wearable event monitor.  I suggested looking into acquiring a Kardia device or an Apple watch or similar device I can provide electrical rhythm monitoring. 7. Perimenopausal state   Medication Adjustments/Labs and Tests Ordered: Current medicines are reviewed at length with the patient today.  Concerns regarding medicines are outlined above.  Orders Placed This Encounter  Procedures   Basic metabolic panel   Lipid panel   Hemoglobin A1c   ECHOCARDIOGRAM COMPLETE   No orders of the defined types were placed in this encounter.   Patient Instructions  Medication Instructions:  No changes *If you need a refill on your cardiac medications before  your next  appointment, please call your pharmacy*   Lab Work: Your provider would like for you to have the following labs today: Lipid, A1C, BMET  If you have labs (blood work) drawn today and your tests are completely normal, you will receive your results only by:  MyChart Message (if you have MyChart) OR  A paper copy in the mail If you have any lab test that is abnormal or we need to change your treatment, we will call you to review the results.   Testing/Procedures: Your physician has requested that you have an echocardiogram. Echocardiography is a painless test that uses sound waves to create images of your heart. It provides your doctor with information about the size and shape of your heart and how well your hearts chambers and valves are working. You may receive an ultrasound enhancing agent through an IV if needed to better visualize your heart during the echo.This procedure takes approximately one hour. There are no restrictions for this procedure. This will take place at the [address].     Follow-Up: At [facility], you and your health needs are our priority.  As part of our continuing mission to provide you with exceptional heart care, we have created designated Provider Care Teams.  These Care Teams include your primary Cardiologist (physician) and Advanced Practice Providers (APPs -  Physician Assistants and Nurse Practitioners) who all work together to provide you with the care you need, when you need it.  We recommend signing up for the patient portal called "MyChart".  Sign up information is provided on this After Visit Summary.  MyChart is used to connect with patients for Virtual Visits (Telemedicine).  Patients are able to view lab/test results, encounter notes, upcoming appointments, etc.  Non-urgent messages can be sent to your provider as well.   To learn more about what you can do with MyChart, go to [email or web address].    Your next appointment:    12 month(s)  The format for your next appointment:   In Person  Provider:   You may see Sanda Klein, MD or one of the following Advanced Practice Providers on your designated Care Team:    Almyra Deforest, PA-C  Fabian Sharp, [place] or   Roby Lofts, [place]    Other Instructions Please look into buying an Apple Watch     Signed, Sanda Klein, MD  10/14/2019 12:41 PM    [place]

## 2019-10-11 NOTE — Patient Instructions (Addendum)
Medication Instructions:  No changes *If you need a refill on your cardiac medications before your next appointment, please call your pharmacy*   Lab Work: Your provider would like for you to have the following labs today: Lipid, A1C, BMET  If you have labs (blood work) drawn today and your tests are completely normal, you will receive your results only by: Marland Kitchen MyChart Message (if you have MyChart) OR . A paper copy in the mail If you have any lab test that is abnormal or we need to change your treatment, we will call you to review the results.   Testing/Procedures: Your physician has requested that you have an echocardiogram. Echocardiography is a painless test that uses sound waves to create images of your heart. It provides your doctor with information about the size and shape of your heart and how well your heart's chambers and valves are working. You may receive an ultrasound enhancing agent through an IV if needed to better visualize your heart during the echo.This procedure takes approximately one hour. There are no restrictions for this procedure. This will take place at the [address].     Follow-Up: At [facility], you and your health needs are our priority.  As part of our continuing mission to provide you with exceptional heart care, we have created designated Provider Care Teams.  These Care Teams include your primary Cardiologist (physician) and Advanced Practice Providers (APPs -  Physician Assistants and Nurse Practitioners) who all work together to provide you with the care you need, when you need it.  We recommend signing up for the patient portal called "MyChart".  Sign up information is provided on this After Visit Summary.  MyChart is used to connect with patients for Virtual Visits (Telemedicine).  Patients are able to view lab/test results, encounter notes, upcoming appointments, etc.  Non-urgent messages can be sent to your provider as well.   To  learn more about what you can do with MyChart, go to [email or web address].    Your next appointment:   12 month(s)  The format for your next appointment:   In Person  Provider:   You may see Sanda Klein, MD or one of the following Advanced Practice Providers on your designated Care Team:    Almyra Deforest, PA-C  Fabian Sharp, [place] or   Roby Lofts, [place]    Other Instructions Please look into buying an Apple Watch

## 2019-10-14 ENCOUNTER — Encounter: Payer: Self-pay | Admitting: Cardiovascular Disease

## 2019-10-16 NOTE — Addendum Note (Signed)
Addended by: Wonda Horner on: 10/16/2019 10:29 AM   Modules accepted: Orders

## 2019-10-26 ENCOUNTER — Ambulatory Visit (HOSPITAL_COMMUNITY): Payer: PRIVATE HEALTH INSURANCE | Attending: Cardiology

## 2019-10-26 ENCOUNTER — Encounter (INDEPENDENT_AMBULATORY_CARE_PROVIDER_SITE_OTHER): Payer: Self-pay

## 2019-10-26 ENCOUNTER — Other Ambulatory Visit: Payer: Self-pay

## 2019-10-26 DIAGNOSIS — I10 Essential (primary) hypertension: Secondary | ICD-10-CM | POA: Diagnosis not present

## 2019-10-26 DIAGNOSIS — Q211 Atrial septal defect, unspecified: Secondary | ICD-10-CM

## 2019-10-26 LAB — ECHOCARDIOGRAM COMPLETE
Area-P 1/2: 2.56 cm2
S' Lateral: 3.5 cm

## 2019-10-31 ENCOUNTER — Ambulatory Visit (INDEPENDENT_AMBULATORY_CARE_PROVIDER_SITE_OTHER): Payer: BC Managed Care – PPO | Admitting: Family Medicine

## 2019-10-31 ENCOUNTER — Other Ambulatory Visit: Payer: Self-pay

## 2019-10-31 ENCOUNTER — Encounter: Payer: Self-pay | Admitting: Family Medicine

## 2019-10-31 ENCOUNTER — Telehealth: Payer: Self-pay

## 2019-10-31 VITALS — BP 132/74 | HR 82 | Temp 97.8°F | Ht 67.0 in | Wt 222.0 lb

## 2019-10-31 DIAGNOSIS — R143 Flatulence: Secondary | ICD-10-CM | POA: Insufficient documentation

## 2019-10-31 DIAGNOSIS — J301 Allergic rhinitis due to pollen: Secondary | ICD-10-CM

## 2019-10-31 DIAGNOSIS — K649 Unspecified hemorrhoids: Secondary | ICD-10-CM | POA: Diagnosis not present

## 2019-10-31 MED ORDER — MOMETASONE FUROATE 50 MCG/ACT NA SUSP: 2.0000 | Freq: Every day | NASAL | 12 refills | Status: AC

## 2019-10-31 MED ORDER — HYDROCORTISONE ACETATE 25 MG RE SUPP: 25.0000 mg | Freq: Every day | RECTAL | 3 refills | Status: DC

## 2019-10-31 NOTE — Patient Instructions (Addendum)
Drink lots of water  Get your fiber from fruits and veggies and cut back on the processed foods with fiber Oatmeal -cooked on the stove   If you take fiber as a supplement - citrucel is the best choice   When flaring-use the cream/suppositories for not more than 14 days at a time and then take a break   tuks pads are fine   If no improvement we should get you back to GI to consider a procedure   Avoid straining

## 2019-10-31 NOTE — Assessment & Plan Note (Signed)
Sent in nasonex ns (which works better than flonase for her)

## 2019-10-31 NOTE — Progress Notes (Signed)
Subjective:    Patient ID: Crystal Tucker, female    DOB: [birth year], 59 y.o.   MRN: [id number]  This visit occurred during the SARS-CoV-2 public health emergency.  Safety protocols were in place, including screening questions prior to the visit, additional usage of staff PPE, and extensive cleaning of exam room while observing appropriate contact time as indicated for disinfecting solutions.    HPI Pt presents with c/o hemorrhoids   Wt Readings from Last 3 Encounters:  10/31/19 222 lb (100.7 kg)  10/11/19 219 lb (99.3 kg)  09/24/19 217 lb 7 oz (98.6 kg)   34.77 kg/m  More bothered by them lately  More external than she use to have  (they itch and burn)  Used anusol hc and it helped  Staying gassy -occ mucous when she passes gas  No new diarrhea (she does eat a lot of fiber)  Some blood when she wipes - bright red /occ clots   Has not used suppositories in a while   She does use witch hazel  Prep H did not help much    Not constipated or straining   (not a lot of straining with new job)  No heavy lifting   Last colonoscopy noted non bleeding internal hemorrhoids in 2017   Patient Active Problem List   Diagnosis Date Noted  . Chest discomfort 09/24/2019  . Dizziness 09/24/2019  . History of heart surgery 09/24/2019  . Hemorrhoids 12/11/2018  . Hyperlipidemia, mild 11/04/2016  . Encounter for screening for HIV 11/03/2016  . Encounter for hepatitis C screening test for low risk patient 11/03/2016  . Encounter for screening mammogram for breast cancer 11/03/2015  . Prediabetes 11/03/2015  . Herpes zoster 10/28/2015  . Benign paroxysmal positional vertigo 02/20/2013  . Obesity 11/10/2011  . Routine general medical examination at a health care facility 11/01/2011  . Allergic rhinitis 07/16/2011  . OSA (obstructive sleep apnea) 07/16/2011  . DEPENDENT EDEMA, LEGS, BILATERAL 10/11/2008  . Essential hypertension 10/26/2006  . GERD 10/26/2006  . Diverticulosis of  large intestine 10/26/2006  . COLONIC POLYPS, HX OF 10/26/2006   Past Medical History:  Diagnosis Date  . Allergy   . Anemia    NOS  . Arthritis   . Cardiac valve prolapse    leaking valve with surgery 1998  . Contact with or exposure to venereal diseases   . Diverticulosis of colon (without mention of hemorrhage)   . Edema   . GERD (gastroesophageal reflux disease)   . H/O: pneumonia   . Hypertension   . Pneumonia   . Pruritus of genital organs   . Rash and other nonspecific skin eruption   . Sleep apnea   . Tubular adenoma of colon   . Unspecified symptom associated with female genital organs    Past Surgical History:  Procedure Laterality Date  . [place]  . COLONOSCOPY  7/03   polyps and tics  . DIAGNOSTIC LAPAROSCOPY    . KNEE ARTHROSCOPY     right  . KNEE SURGERY  10/04  . LAPAROSCOPY N/A 11/12/2014   Procedure: LAPAROSCOPY DIAGNOSTIC;  Surgeon: Molli Posey, MD;  Location: [facility];  Service: Gynecology;  Laterality: N/A;  . PARTIAL HYSTERECTOMY    . POLYPECTOMY    . RECTAL POLYPECTOMY  8/07  . ROTATOR CUFF REPAIR Right    2  . SALPINGOOPHORECTOMY Right 11/12/2014   Procedure: SALPINGO OOPHORECTOMY;  Surgeon: Molli Posey, MD;  Location: [facility];  Service: Gynecology;  Laterality: Right;  . WRIST SURGERY Right    x 2   Social History   Tobacco Use  . Smoking status: Former Smoker    Packs/day: 0.80    Years: 30.00    Pack years: 24.00    Types: Cigarettes    Quit date: 03/01/2002    Years since quitting: 17.6  . Smokeless tobacco: Never Used  Substance Use Topics  . Alcohol use: Yes    Alcohol/week: 0.0 standard drinks    Comment: rarely  . Drug use: No   Family History  Problem Relation Age of Onset  . Heart disease Maternal Grandmother   . Cancer Paternal Aunt        type unknown  . Cerebral aneurysm Paternal Aunt   . Cerebral aneurysm Cousin   . Colon polyps Mother   . Diabetes Mother   . Hypertension Mother   .  Diabetes Maternal Aunt   . Heart disease Maternal Aunt   . Heart disease Maternal Uncle   . Cancer Paternal Aunt        x 2 , types unknown  . Colon cancer Neg Hx   . Esophageal cancer Neg Hx   . Rectal cancer Neg Hx   . Stomach cancer Neg Hx    Allergies  Allergen Reactions  . Lisinopril     cough  . Other Rash    All citrus- lemons, oranges, grapefruit, watermelon, cantelope - all fruits with juices  All citrus- lemons, oranges, grapefruit, watermelon, cantelope - all fruits with juices  All citrus- lemons, oranges, grapefruit, watermelon, cantelope - all fruits with juices    Current Outpatient Medications on File Prior to Visit  Medication Sig Dispense Refill  . Acetaminophen (TYLENOL ARTHRITIS EXT RELIEF PO) Take 2 tablets by mouth daily.    Marland Kitchen albuterol (PROAIR HFA) 108 (90 Base) MCG/ACT inhaler Inhale 2 puffs into the lungs every 6 (six) hours as needed for wheezing or shortness of breath. 18 g 5  . amLODipine (NORVASC) 5 MG tablet TAKE 1 TABLET BY MOUTH ONCE DAILY 90 tablet 3  . amoxicillin (AMOXIL) 500 MG tablet Take 4 pills at one time by mouth 60 minutes before dental visit 4 tablet 3  . fluticasone (FLONASE) 50 MCG/ACT nasal spray Place 2 sprays into both nostrils daily. 16 g 6  . omeprazole (PRILOSEC) 20 MG capsule Take 1 capsule (20 mg total) by mouth daily. 90 capsule 3  . potassium chloride SA (KLOR-CON) 20 MEQ tablet Take 1 tablet (20 mEq total) by mouth daily. 90 tablet 3   No current facility-administered medications on file prior to visit.    Review of Systems  Constitutional: Negative for activity change, appetite change, fatigue, fever and unexpected weight change.  HENT: Negative for congestion, ear pain, rhinorrhea, sinus pressure and sore throat.   Eyes: Negative for pain, redness and visual disturbance.  Respiratory: Negative for cough, shortness of breath and wheezing.   Cardiovascular: Negative for chest pain and palpitations.  Gastrointestinal:  Positive for anal bleeding and rectal pain. Negative for abdominal distention, abdominal pain, blood in stool, constipation and diarrhea.  Endocrine: Negative for polydipsia and polyuria.  Genitourinary: Negative for dysuria, frequency and urgency.  Musculoskeletal: Negative for arthralgias, back pain and myalgias.  Skin: Negative for pallor and rash.       Rectal itch/burning  Allergic/Immunologic: Negative for environmental allergies.  Neurological: Negative for dizziness, syncope and headaches.  Hematological: Negative for adenopathy. Does not bruise/bleed easily.  Psychiatric/Behavioral: Negative for decreased  concentration and dysphoric mood. The patient is not nervous/anxious.        Objective:   Physical Exam Constitutional:      General: She is not in acute distress.    Appearance: Normal appearance. She is obese. She is not ill-appearing.  HENT:     Mouth/Throat:     Mouth: Mucous membranes are moist.  Eyes:     General: No scleral icterus.    Conjunctiva/sclera: Conjunctivae normal.     Pupils: Pupils are equal, round, and reactive to light.  Cardiovascular:     Rate and Rhythm: Normal rate and regular rhythm.  Pulmonary:     Effort: Pulmonary effort is normal. No respiratory distress.     Breath sounds: Normal breath sounds.  Abdominal:     General: Abdomen is flat. Bowel sounds are normal. There is no distension.     Palpations: There is no mass.     Tenderness: There is no abdominal tenderness.  Genitourinary:    Rectum: Tenderness and external hemorrhoid present. No mass or anal fissure. Normal anal tone.     Comments: Pt has non thrombosed ext hemorrhoids at 12:00 along with some skin tags  Mildly tender  No swelling or open areas  No masses noted Skin:    General: Skin is warm and dry.     Coloration: Skin is not pale.     Findings: No erythema or rash.  Neurological:     Mental Status: She is alert.  Psychiatric:        Mood and Affect: Mood normal.            Assessment & Plan:   Problem List Items Addressed This Visit      Cardiovascular and Mediastinum   Hemorrhoids - Primary    Pt has both internal and external with occ bleeding and rectal itch/burning  Enc to avoid straining or prolonged sitting  Refilled anusol suppositories (she has cream for ext use) to try for max 14 days Enc continued use of witch hazel  Cold compresses may also help  If no improvement would consider ref to GI for disc of surgical treatment          Other   Flatus    Pt c/o more gas/flatus with higher fiber diet  Enc her to get more of her fiber from produce (not processed foods)  She may need to ease into high fiber diet more slowly  Also avoid carbonation  If fiber suppl is needed-recommend citrucel otc which is less gas producing

## 2019-10-31 NOTE — Telephone Encounter (Signed)
I spoke with pt; pt said Nasonex was not sent to [facility] [address]. I spoke with [place] at [facility] [address] and nasonex was ready for pick up. Pt voiced understanding and she is still in [place] and will go up front to pick up rx.nothing further needed at this time.

## 2019-10-31 NOTE — Assessment & Plan Note (Signed)
Pt has both internal and external with occ bleeding and rectal itch/burning  Enc to avoid straining or prolonged sitting  Refilled anusol suppositories (she has cream for ext use) to try for max 14 days Enc continued use of witch hazel  Cold compresses may also help  If no improvement would consider ref to GI for disc of surgical treatment

## 2019-10-31 NOTE — Assessment & Plan Note (Signed)
Pt c/o more gas/flatus with higher fiber diet  Enc her to get more of her fiber from produce (not processed foods)  She may need to ease into high fiber diet more slowly  Also avoid carbonation  If fiber suppl is needed-recommend citrucel otc which is less gas producing

## 2019-11-06 ENCOUNTER — Ambulatory Visit: Payer: PRIVATE HEALTH INSURANCE | Admitting: Cardiovascular Disease

## 2019-11-22 ENCOUNTER — Telehealth: Payer: Self-pay | Admitting: Family Medicine

## 2019-11-22 ENCOUNTER — Other Ambulatory Visit: Payer: Self-pay | Admitting: Family Medicine

## 2019-11-22 NOTE — Telephone Encounter (Signed)
Pt called [facility] send refill request last week.  She is out of meds  potassium Amlodipine omeprizole   [facility] [address]

## 2019-11-22 NOTE — Telephone Encounter (Signed)
Dr. Glori Bickers refilled meds for a year when pt was here for her CPE on 05/31/19. Called pharmacy and they said it's an error on their end and they do see the refills. They will refill her meds now. Pt notified

## 2020-04-10 ENCOUNTER — Ambulatory Visit: Payer: BC Managed Care – PPO | Admitting: Family Medicine

## 2020-04-10 ENCOUNTER — Encounter: Payer: Self-pay | Admitting: Family Medicine

## 2020-04-10 ENCOUNTER — Other Ambulatory Visit: Payer: Self-pay

## 2020-04-10 VITALS — BP 136/78 | HR 85 | Temp 96.9°F | Ht 67.0 in | Wt 223.6 lb

## 2020-04-10 DIAGNOSIS — M79645 Pain in left finger(s): Secondary | ICD-10-CM

## 2020-04-10 DIAGNOSIS — G8929 Other chronic pain: Secondary | ICD-10-CM

## 2020-04-10 DIAGNOSIS — M545 Low back pain, unspecified: Secondary | ICD-10-CM

## 2020-04-10 DIAGNOSIS — M549 Dorsalgia, unspecified: Secondary | ICD-10-CM | POA: Insufficient documentation

## 2020-04-10 DIAGNOSIS — M79605 Pain in left leg: Secondary | ICD-10-CM

## 2020-04-10 LAB — POC URINALSYSI DIPSTICK (AUTOMATED)
Bilirubin, UA: 1
Blood, UA: NEGATIVE
Glucose, UA: NEGATIVE
Ketones, UA: 5
Leukocytes, UA: NEGATIVE
Nitrite, UA: NEGATIVE
Protein, UA: POSITIVE — AB
Spec Grav, UA: 1.025 (ref 1.010–1.025)
Urobilinogen, UA: 0.2 E.U./dL
pH, UA: 6 (ref 5.0–8.0)

## 2020-04-10 MED ORDER — METHOCARBAMOL 500 MG PO TABS: 500.0000 mg | ORAL_TABLET | Freq: Three times a day (TID) | ORAL | 1 refills | Status: DC | PRN

## 2020-04-10 NOTE — Assessment & Plan Note (Signed)
L lumbar musculature- suspect spasm and strain Adv heat  Methocarbamol prn with caution of sedation  voltaren gel 1% prn  Declines PT right now so given handout re: rehab and exericses rev Eventually needs a regular exercise program  Update if not starting to improve in a week or if worsening

## 2020-04-10 NOTE — Patient Instructions (Addendum)
Drink more water- regularly   I think you have muscular back pain  Use heat  Try the exercises  If not improved we should consider physical therapy   Try the voltaren gel on hand and back  - up to four times daily  Try the muscle relaxer- watch out for sedation   A wrist splint may help the hand  Try to give the left hand a rest when you can   Update if not starting to improve in a week or if worsening

## 2020-04-10 NOTE — Assessment & Plan Note (Signed)
Pain at L first carpal-metacarpal area  Positive Finkelstein test  Nl rom and no joint deformity  Suspect tendonitis-possible de quervain  Adv wrist splint prn / favor R hand for driving an reped tasks  voltaren gel prn qid Update if worse or not improved 1-2 wk

## 2020-04-10 NOTE — Progress Notes (Signed)
Subjective:    Patient ID: Crystal Tucker, female    DOB: [birth year], 60 y.o.   MRN: [id number]  This visit occurred during the SARS-CoV-2 public health emergency.  Safety protocols were in place, including screening questions prior to the visit, additional usage of staff PPE, and extensive cleaning of exam room while observing appropriate contact time as indicated for disinfecting solutions.    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   Started with a catch in her lower back  Middle and to the left  Both sharp/dull Worst to move after inactivity   Used some heat Took some old tramadol Tylenol  Avoids nsaids due to stomach   No exercise    No urinary symptoms   (had some but water and cranberry juice wiped it out)  She tries to drink a lot of water  One coke per day   Results for orders placed or performed in visit on 04/10/20  POCT Urinalysis Dipstick (Automated)  Result Value Ref Range   Color, UA Dark Yellow/ Amber    Clarity, UA Clear    Glucose, UA Negative Negative   Bilirubin, UA 1 mg/dL    Ketones, UA 5 mg/dL    Spec Grav, UA 1.025 1.010 - 1.025   Blood, UA Negative    pH, UA 6.0 5.0 - 8.0   Protein, UA Positive (A) Negative   Urobilinogen, UA 0.2 0.2 or 1.0 E.U./dL   Nitrite, UA Negative    Leukocytes, UA Negative Negative    Had not eaten or drank yet today     L thumb has been bothering her  No trauma  Is R handed but does a lot of things with her L (drives with that hand) - holding the steering wheel bothers her  No swelling or redness   Review of Systems  Constitutional: Negative for activity change, appetite change, fatigue, fever and unexpected weight change.  HENT: Negative for congestion, ear pain, rhinorrhea, sinus pressure and sore throat.   Eyes:  Negative for pain, redness and visual disturbance.  Respiratory: Negative for cough, shortness of breath and wheezing.   Cardiovascular: Negative for chest pain and palpitations.  Gastrointestinal: Negative for abdominal pain, blood in stool, constipation and diarrhea.  Endocrine: Negative for polydipsia and polyuria.  Genitourinary: Negative for dysuria, frequency and urgency.  Musculoskeletal: Positive for arthralgias and back pain. Negative for myalgias.  Skin: Negative for pallor and rash.  Allergic/Immunologic: Negative for environmental allergies.  Neurological: Negative for dizziness, syncope, numbness and headaches.  Hematological: Negative for adenopathy. Does not bruise/bleed easily.  Psychiatric/Behavioral: Negative for decreased concentration and dysphoric mood. The patient is not nervous/anxious.        Objective:   Physical Exam Constitutional:      General: She is not in acute distress.    Appearance: Normal appearance. She is well-developed and well-nourished. She is obese. She is not ill-appearing.  HENT:     Head: Normocephalic and atraumatic.  Eyes:     General: No scleral icterus.    Extraocular Movements: EOM normal.     Conjunctiva/sclera: Conjunctivae normal.     Pupils: Pupils are equal, round, and reactive to light.  Cardiovascular:     Rate and Rhythm: Normal rate  and regular rhythm.  Pulmonary:     Effort: Pulmonary effort is normal.     Breath sounds: Normal breath sounds. No wheezing or rales.  Abdominal:     General: Bowel sounds are normal. There is no distension.     Palpations: Abdomen is soft.     Tenderness: There is no abdominal tenderness. There is no right CVA tenderness or left CVA tenderness.     Comments: No suprapubic tenderness or fullness    Musculoskeletal:        General: Tenderness present.     Cervical back: Normal range of motion and neck supple.     Lumbar back: Spasms and tenderness present. No swelling, edema, deformity or  bony tenderness. Decreased range of motion. Negative right straight leg raise test and negative left straight leg raise test.     Comments: Tender over L lumbar musculature into buttock  Nl rom of LEs  Neg bent knee raise for leg or foot pain  Nl gait  Pain with LS flex to 30 deg (nl extension)  Worse with L lateral bend   L hand- mild tenderness of base of first MC (carpal metacarpal jxn)  Pos finkelstein test  Nl perf of hand No joint deformity   Lymphadenopathy:     Cervical: No cervical adenopathy.  Skin:    General: Skin is warm and dry.     Coloration: Skin is not pale.     Findings: No erythema or rash.  Neurological:     Mental Status: She is alert.     Cranial Nerves: No cranial nerve deficit.     Sensory: No sensory deficit.     Motor: No weakness, atrophy or abnormal muscle tone.     Coordination: Coordination normal.     Deep Tendon Reflexes: Strength normal and reflexes are normal and symmetric.     Comments: Negative SLR  Psychiatric:        Mood and Affect: Mood and affect and mood normal.           Assessment & Plan:   Problem List Items Addressed This Visit      Other   Low back pain radiating to left leg    L lumbar musculature- suspect spasm and strain Adv heat  Methocarbamol prn with caution of sedation  voltaren gel 1% prn  Declines PT right now so given handout re: rehab and exericses rev Eventually needs a regular exercise program  Update if not starting to improve in a week or if worsening        Relevant Medications   methocarbamol (ROBAXIN) 500 MG tablet   Chronic pain of left thumb    Pain at L first carpal-metacarpal area  Positive Finkelstein test  Nl rom and no joint deformity  Suspect tendonitis-possible de quervain  Adv wrist splint prn / favor R hand for driving an reped tasks  voltaren gel prn qid Update if worse or not improved 1-2 wk      Relevant Medications   methocarbamol (ROBAXIN) 500 MG tablet    Other Visit  Diagnoses    Low back pain, unspecified back pain laterality, unspecified chronicity, unspecified whether sciatica present    -  Primary   Relevant Medications   methocarbamol (ROBAXIN) 500 MG tablet   Other Relevant Orders   POCT Urinalysis Dipstick (Automated) (Completed)

## 2020-05-18 ENCOUNTER — Other Ambulatory Visit: Payer: Self-pay | Admitting: Family Medicine

## 2020-06-13 ENCOUNTER — Encounter: Payer: Self-pay | Admitting: Family Medicine

## 2020-06-13 ENCOUNTER — Other Ambulatory Visit: Payer: Self-pay

## 2020-06-13 ENCOUNTER — Telehealth (INDEPENDENT_AMBULATORY_CARE_PROVIDER_SITE_OTHER): Payer: BC Managed Care – PPO | Admitting: Family Medicine

## 2020-06-13 DIAGNOSIS — J0100 Acute maxillary sinusitis, unspecified: Secondary | ICD-10-CM

## 2020-06-13 MED ORDER — AMOXICILLIN-POT CLAVULANATE 500-125 MG PO TABS: 1.0000 | ORAL_TABLET | Freq: Two times a day (BID) | ORAL | 0 refills | Status: AC

## 2020-06-13 NOTE — Progress Notes (Signed)
Virtual Visit via Telephone Note Able to connect with patient via [place].  Pt states she was on the call but only saw herself.  I connected with Crystal Tucker on 06/13/20 at 11:00 AM EDT by telephone and verified that I am speaking with the correct person using two identifiers.   I discussed the limitations, risks, security and privacy concerns of performing an evaluation and management service by telephone and the availability of in person appointments. I also discussed with the patient that there may be a patient responsible charge related to this service. The patient expressed understanding and agreed to proceed.  Location patient: home Location provider: work or home office Participants present for the call: patient, provider Patient did not have a visit in the prior 7 days to address this/these issue(s).   History of Present Illness: Pt is a 60 yo female with pmh sig for GERD, HTN, allergies, OSA, vertigo, diverticulosis, obesity, prediabetes, HLD, former smoker was followed by Crystal Pardon, MD and seen for acute concern.  Notes all her teeth on one side have been hurting. Pt also with L ear pain, pain/pressure in L cheek x 1 wk.  Endorses mild sore throat. Denies n/v, cough, loss of taste or smell.  Tried tylenol, left over prednisone, and tramadol for the pain.  States keeps her up at night.  Pt works in a group home.  States has a COVID test q Friday and daily temperature checks.     Observations/Objective: Patient sounds cheerful and well on the phone. I do not appreciate any SOB. Speech and thought processing are grossly intact. Patient reported vitals:  Assessment and Plan: Acute maxillary sinusitis, recurrence not specified  -Continue supportive care -Patient advised to obtain a refill on Nasonex -For continued or worsening symptoms follow-up with PCP.  Consider dental appointment for continued tooth pain with resolution of other symptoms - Plan: amoxicillin-clavulanate  (AUGMENTIN) 500-125 MG tablet   Follow Up Instructions: F/u prn with PCP   99441 [id number] [id number] 11-20 [id number] 21-30 I did not refer this patient for an OV in the next 24 hours for this/these issue(s).  I discussed the assessment and treatment plan with the patient. The patient was provided an opportunity to ask questions and all were answered. The patient agreed with the plan and demonstrated an understanding of the instructions.   The patient was advised to call back or seek an in-person evaluation if the symptoms worsen or if the condition fails to improve as anticipated.  I provided 11:37 minutes of non-face-to-face time during this encounter.   Crystal Ruddy, MD

## 2020-06-26 ENCOUNTER — Telehealth: Payer: Self-pay | Admitting: Family Medicine

## 2020-06-27 NOTE — Telephone Encounter (Signed)
Please schedule CPE with fasting labs prior with Dr. Glori Bickers.

## 2020-06-27 NOTE — Telephone Encounter (Signed)
Patient scheduled cpx on 07/29/20.

## 2020-07-16 ENCOUNTER — Other Ambulatory Visit (INDEPENDENT_AMBULATORY_CARE_PROVIDER_SITE_OTHER): Payer: BC Managed Care – PPO

## 2020-07-16 ENCOUNTER — Telehealth (INDEPENDENT_AMBULATORY_CARE_PROVIDER_SITE_OTHER): Payer: BC Managed Care – PPO | Admitting: Family Medicine

## 2020-07-16 ENCOUNTER — Other Ambulatory Visit: Payer: Self-pay

## 2020-07-16 DIAGNOSIS — R7303 Prediabetes: Secondary | ICD-10-CM | POA: Diagnosis not present

## 2020-07-16 DIAGNOSIS — Z79899 Other long term (current) drug therapy: Secondary | ICD-10-CM

## 2020-07-16 DIAGNOSIS — I10 Essential (primary) hypertension: Secondary | ICD-10-CM | POA: Diagnosis not present

## 2020-07-16 DIAGNOSIS — E785 Hyperlipidemia, unspecified: Secondary | ICD-10-CM

## 2020-07-16 LAB — CBC WITH DIFFERENTIAL/PLATELET
Basophils Absolute: 0.1 10*3/uL (ref 0.0–0.1)
Basophils Relative: 1.1 % (ref 0.0–3.0)
Eosinophils Absolute: 0.1 10*3/uL (ref 0.0–0.7)
Eosinophils Relative: 2.5 % (ref 0.0–5.0)
HCT: 35.7 % — ABNORMAL LOW (ref 36.0–46.0)
Hemoglobin: 11.6 g/dL — ABNORMAL LOW (ref 12.0–15.0)
Lymphocytes Relative: 40.1 % (ref 12.0–46.0)
Lymphs Abs: 2 10*3/uL (ref 0.7–4.0)
MCHC: 32.4 g/dL (ref 30.0–36.0)
MCV: 80.6 fl (ref 78.0–100.0)
Monocytes Absolute: 0.4 10*3/uL (ref 0.1–1.0)
Monocytes Relative: 8.7 % (ref 3.0–12.0)
Neutro Abs: 2.4 10*3/uL (ref 1.4–7.7)
Neutrophils Relative %: 47.6 % (ref 43.0–77.0)
Platelets: 290 10*3/uL (ref 150.0–400.0)
RBC: 4.43 Mil/uL (ref 3.87–5.11)
RDW: 13.9 % (ref 11.5–15.5)
WBC: 5.1 10*3/uL (ref 4.0–10.5)

## 2020-07-16 LAB — COMPREHENSIVE METABOLIC PANEL
ALT: 13 U/L (ref 0–35)
AST: 13 U/L (ref 0–37)
Albumin: 4 g/dL (ref 3.5–5.2)
Alkaline Phosphatase: 65 U/L (ref 39–117)
BUN: 14 mg/dL (ref 6–23)
CO2: 29 mEq/L (ref 19–32)
Calcium: 9.1 mg/dL (ref 8.4–10.5)
Chloride: 104 mEq/L (ref 96–112)
Creatinine, Ser: 0.73 mg/dL (ref 0.40–1.20)
GFR: 89.47 mL/min (ref >60.00)
Glucose, Bld: 103 mg/dL — ABNORMAL HIGH (ref 70–99)
Potassium: 4.2 mEq/L (ref 3.5–5.1)
Sodium: 140 mEq/L (ref 135–145)
Total Bilirubin: 0.4 mg/dL (ref 0.2–1.2)
Total Protein: 6.8 g/dL (ref 6.0–8.3)

## 2020-07-16 LAB — LIPID PANEL
Cholesterol: 197 mg/dL (ref 0–200)
HDL: 58.1 mg/dL (ref >39.00)
LDL Cholesterol: 123 mg/dL — ABNORMAL HIGH (ref 0–99)
NonHDL: 138.48
Total CHOL/HDL Ratio: 3
Triglycerides: 76 mg/dL (ref 0.0–149.0)
VLDL: 15.2 mg/dL (ref 0.0–40.0)

## 2020-07-16 LAB — VITAMIN B12: Vitamin B-12: 201 pg/mL — ABNORMAL LOW (ref 211–911)

## 2020-07-16 LAB — VITAMIN D 25 HYDROXY (VIT D DEFICIENCY, FRACTURES): VITD: 14.04 ng/mL — ABNORMAL LOW (ref 30.00–100.00)

## 2020-07-16 LAB — TSH: TSH: 1.19 u[IU]/mL (ref 0.35–4.50)

## 2020-07-16 LAB — HEMOGLOBIN A1C: Hgb A1c MFr Bld: 6.6 % — ABNORMAL HIGH (ref 4.6–6.5)

## 2020-07-16 NOTE — Telephone Encounter (Signed)
-----   Message from Crystal Tucker sent at 07/16/2020  7:44 AM EDT ----- Regarding: lab orders for now Patient is scheduled for CPX labs, please order future labs, Thanks , [name]

## 2020-07-24 ENCOUNTER — Other Ambulatory Visit: Payer: Self-pay

## 2020-07-24 ENCOUNTER — Ambulatory Visit (INDEPENDENT_AMBULATORY_CARE_PROVIDER_SITE_OTHER): Payer: BC Managed Care – PPO

## 2020-07-24 DIAGNOSIS — E538 Deficiency of other specified B group vitamins: Secondary | ICD-10-CM | POA: Diagnosis not present

## 2020-07-24 MED ORDER — CYANOCOBALAMIN 1000 MCG/ML IJ SOLN
1000.0000 ug | Freq: Once | INTRAMUSCULAR | Status: AC
  Administered 2020-07-24: 1000 ug via INTRAMUSCULAR

## 2020-07-24 NOTE — Progress Notes (Signed)
Per orders of Dr. Damita Dunnings in the absence of Dr. Glori Bickers, injection of B12, given by Aneta Mins, RN. Patient tolerated injection well in L Deltoid.

## 2020-07-29 ENCOUNTER — Ambulatory Visit (INDEPENDENT_AMBULATORY_CARE_PROVIDER_SITE_OTHER): Payer: BC Managed Care – PPO | Admitting: Family Medicine

## 2020-07-29 ENCOUNTER — Encounter: Payer: Self-pay | Admitting: Family Medicine

## 2020-07-29 ENCOUNTER — Other Ambulatory Visit: Payer: Self-pay

## 2020-07-29 VITALS — BP 136/80 | HR 71 | Temp 96.8°F | Ht 67.0 in | Wt 224.9 lb

## 2020-07-29 DIAGNOSIS — K649 Unspecified hemorrhoids: Secondary | ICD-10-CM

## 2020-07-29 DIAGNOSIS — E538 Deficiency of other specified B group vitamins: Secondary | ICD-10-CM

## 2020-07-29 DIAGNOSIS — I10 Essential (primary) hypertension: Secondary | ICD-10-CM

## 2020-07-29 DIAGNOSIS — K219 Gastro-esophageal reflux disease without esophagitis: Secondary | ICD-10-CM

## 2020-07-29 DIAGNOSIS — Z79899 Other long term (current) drug therapy: Secondary | ICD-10-CM

## 2020-07-29 DIAGNOSIS — E559 Vitamin D deficiency, unspecified: Secondary | ICD-10-CM

## 2020-07-29 DIAGNOSIS — Z0001 Encounter for general adult medical examination with abnormal findings: Secondary | ICD-10-CM | POA: Diagnosis not present

## 2020-07-29 DIAGNOSIS — Z6835 Body mass index (BMI) 35.0-35.9, adult: Secondary | ICD-10-CM

## 2020-07-29 DIAGNOSIS — E785 Hyperlipidemia, unspecified: Secondary | ICD-10-CM

## 2020-07-29 DIAGNOSIS — R7303 Prediabetes: Secondary | ICD-10-CM

## 2020-07-29 DIAGNOSIS — Z1211 Encounter for screening for malignant neoplasm of colon: Secondary | ICD-10-CM

## 2020-07-29 MED ORDER — OMEPRAZOLE 20 MG PO CPDR: 1.0000 | DELAYED_RELEASE_CAPSULE | Freq: Every day | ORAL | 3 refills | Status: DC

## 2020-07-29 MED ORDER — ERGOCALCIFEROL 1.25 MG (50000 UT) PO CAPS: 50000.0000 [IU] | ORAL_CAPSULE | ORAL | 0 refills | Status: DC

## 2020-07-29 MED ORDER — AMLODIPINE BESYLATE 5 MG PO TABS: ORAL_TABLET | ORAL | 3 refills | Status: DC

## 2020-07-29 MED ORDER — POTASSIUM CHLORIDE CRYS ER 20 MEQ PO TBCR: 20.0000 meq | EXTENDED_RELEASE_TABLET | Freq: Every day | ORAL | 3 refills | Status: DC

## 2020-07-29 NOTE — Assessment & Plan Note (Signed)
Low D at 14.0  Disc imp to bone and overall health  Pt takes ppi  Px ergocalciferol 50,000 iu weekly for 12 weeks Also take 2000 iu daily

## 2020-07-29 NOTE — Assessment & Plan Note (Signed)
Improved Uses anusol hc supp prn

## 2020-07-29 NOTE — Assessment & Plan Note (Signed)
Both B12 and vit D are low Will make plan to replace Pt is again trying to wean off of ppi

## 2020-07-29 NOTE — Progress Notes (Signed)
Subjective:    Patient ID: Crystal Tucker, female    DOB: March 11, 1960, 60 y.o.   MRN: [id number]  This visit occurred during the SARS-CoV-2 public health emergency.  Safety protocols were in place, including screening questions prior to the visit, additional usage of staff PPE, and extensive cleaning of exam room while observing appropriate contact time as indicated for disinfecting solutions.    HPI  Here for health maintenance exam and to review chronic medical problems    Wt Readings from Last 3 Encounters:  07/29/20 224 lb 14.4 oz (102 kg)  04/10/20 223 lb 9 oz (101.4 kg)  10/31/19 222 lb (100.7 kg)   35.22 kg/m  Feeling pretty good  Trying to take care of herself   covid vaccinated-had one booster  Flu shot -has in the fall at work  Tdap 9/13 Zoster status -had shingles in 2017  Unsure if she wants shingrix   Mammogram 11/20 Self breast exam no lumps  (dense tissue)   Gyn -hysterectomy  Colonoscopy 5/17 with 5 y recall (due) Bleeding hemorrhoids at that time -that is fine   utd screen for hep C and HIV  HTN bp is stable today  No cp or palpitations or headaches or edema  No side effects to medicines  BP Readings from Last 3 Encounters:  07/29/20 136/80  04/10/20 136/78  10/31/19 132/74     Pulse Readings from Last 3 Encounters:  07/29/20 71  04/10/20 85  10/31/19 82   Taking amlodipine 5 mg daily    GERD-takes omeprazole 20 mg  Now she is trying to take every other day  In the past could not wean   Lab Results  Component Value Date   VITAMINB12 201 (L) 07/16/2020   She started the shots as scheduled   Vit D low at 14.0 She has some aches all over /some orthopedic problems     Prediabetes Lab Results  Component Value Date   HGBA1C 6.6 (H) 07/16/2020  this is up from 6.2  Was diabetic prior to that  Used to be on metformin xr-does not want to re start  Diet is fairly stable, has not gained more weight but needs to cut sugar more (ice  cream)  Exercise - 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, also has to walk with the residents  She cannot add more exercise  Last eye exam =unsure when due    Hyperlipidemia Lab Results  Component Value Date   CHOL 197 07/16/2020   CHOL 195 05/31/2019   CHOL 176 10/26/2016   Lab Results  Component Value Date   HDL 58.10 07/16/2020   HDL 55.50 05/31/2019   HDL 50.30 10/26/2016   Lab Results  Component Value Date   LDLCALC 123 (H) 07/16/2020   [place] 126 (H) 05/31/2019   [place] 108 (H) 10/26/2016   Lab Results  Component Value Date   TRIG 76.0 07/16/2020   TRIG 66.0 05/31/2019   TRIG 92.0 10/26/2016   Lab Results  Component Value Date   CHOLHDL 3 07/16/2020   CHOLHDL 4 05/31/2019   CHOLHDL 4 10/26/2016   No results found for: LDLDIRECT Goal for LDL will depend on sugar   Lab Results  Component Value Date   CREATININE 0.73 07/16/2020   BUN 14 07/16/2020   NA 140 07/16/2020   K 4.2 07/16/2020   CL 104 07/16/2020   CO2 29 07/16/2020   Lab Results  Component Value Date   ALT 13 07/16/2020  AST 13 07/16/2020   ALKPHOS 65 07/16/2020   BILITOT 0.4 07/16/2020   - Patient Active Problem List   Diagnosis Date Noted  . Vitamin B12 deficiency 07/29/2020  . Vitamin D deficiency 07/29/2020  . Colon cancer screening 07/29/2020  . Current use of proton pump inhibitor 07/16/2020  . Low back pain radiating to left leg 04/10/2020  . Chronic pain of left thumb 04/10/2020  . Flatus 10/31/2019  . Dizziness 09/24/2019  . History of heart surgery 09/24/2019  . Hemorrhoids 12/11/2018  . Hyperlipidemia, mild 11/04/2016  . Encounter for screening for HIV 11/03/2016  . Encounter for hepatitis C screening test for low risk patient 11/03/2016  . Encounter for screening mammogram for breast cancer 11/03/2015  . Prediabetes 11/03/2015  . Herpes zoster 10/28/2015  . Obesity 11/10/2011  . Encounter for general adult medical examination with abnormal findings 11/01/2011  . Allergic  rhinitis 07/16/2011  . OSA (obstructive sleep apnea) 07/16/2011  . DEPENDENT EDEMA, LEGS, BILATERAL 10/11/2008  . Essential hypertension 10/26/2006  . GERD 10/26/2006  . Diverticulosis of large intestine 10/26/2006  . COLONIC POLYPS, HX OF 10/26/2006   Past Medical History:  Diagnosis Date  . Allergy   . Anemia    NOS  . Arthritis   . Cardiac valve prolapse    leaking valve with surgery 1998  . Contact with or exposure to venereal diseases   . Diverticulosis of colon (without mention of hemorrhage)   . Edema   . GERD (gastroesophageal reflux disease)   . H/O: pneumonia   . Hypertension   . Pneumonia   . Pruritus of genital organs   . Rash and other nonspecific skin eruption   . Sleep apnea   . Tubular adenoma of colon   . Unspecified symptom associated with female genital organs    Past Surgical History:  Procedure Laterality Date  . [place]  . COLONOSCOPY  7/03   polyps and tics  . DIAGNOSTIC LAPAROSCOPY    . KNEE ARTHROSCOPY     right  . KNEE SURGERY  10/04  . LAPAROSCOPY N/A 11/12/2014   Procedure: LAPAROSCOPY DIAGNOSTIC;  Surgeon: Molli Posey, MD;  Location: [facility];  Service: Gynecology;  Laterality: N/A;  . PARTIAL HYSTERECTOMY    . POLYPECTOMY    . RECTAL POLYPECTOMY  8/07  . ROTATOR CUFF REPAIR Right    2  . SALPINGOOPHORECTOMY Right 11/12/2014   Procedure: SALPINGO OOPHORECTOMY;  Surgeon: Molli Posey, MD;  Location: [facility];  Service: Gynecology;  Laterality: Right;  . WRIST SURGERY Right    x 2   Social History   Tobacco Use  . Smoking status: Former Smoker    Packs/day: 0.80    Years: 30.00    Pack years: 24.00    Types: Cigarettes    Quit date: 03/01/2002    Years since quitting: 18.4  . Smokeless tobacco: Never Used  Substance Use Topics  . Alcohol use: Yes    Alcohol/week: 0.0 standard drinks    Comment: rarely  . Drug use: No   Family History  Problem Relation Age of Onset  . Heart disease Maternal Grandmother    . Cancer Paternal Aunt        type unknown  . Cerebral aneurysm Paternal Aunt   . Cerebral aneurysm Cousin   . Colon polyps Mother   . Diabetes Mother   . Hypertension Mother   . Diabetes Maternal Aunt   . Heart disease Maternal Aunt   .  Heart disease Maternal Uncle   . Cancer Paternal Aunt        x 2 , types unknown  . Colon cancer Neg Hx   . Esophageal cancer Neg Hx   . Rectal cancer Neg Hx   . Stomach cancer Neg Hx    Allergies  Allergen Reactions  . Lisinopril     cough  . Other Rash    All citrus- lemons, oranges, grapefruit, watermelon, cantelope - all fruits with juices    Current Outpatient Medications on File Prior to Visit  Medication Sig Dispense Refill  . Acetaminophen (TYLENOL ARTHRITIS EXT RELIEF PO) Take 2 tablets by mouth daily.    Marland Kitchen albuterol (PROAIR HFA) 108 (90 Base) MCG/ACT inhaler Inhale 2 puffs into the lungs every 6 (six) hours as needed for wheezing or shortness of breath. 18 g 5  . hydrocortisone (ANUSOL-HC) 25 MG suppository Place 1 suppository (25 mg total) rectally at bedtime. 12 suppository 3  . methocarbamol (ROBAXIN) 500 MG tablet Take 1 tablet (500 mg total) by mouth every 8 (eight) hours as needed for muscle spasms (back pain). Watch out for sedation 30 tablet 1  . mometasone (NASONEX) 50 MCG/ACT nasal spray Place 2 sprays into the nose daily. 1 each 12   No current facility-administered medications on file prior to visit.    Review of Systems  Constitutional: Positive for fatigue. Negative for activity change, appetite change, fever and unexpected weight change.  HENT: Negative for congestion, ear pain, rhinorrhea, sinus pressure and sore throat.   Eyes: Negative for pain, redness and visual disturbance.  Respiratory: Negative for cough, shortness of breath and wheezing.   Cardiovascular: Negative for chest pain and palpitations.  Gastrointestinal: Negative for abdominal pain, blood in stool, constipation and diarrhea.  Endocrine: Negative  for polydipsia and polyuria.  Genitourinary: Negative for dysuria, frequency and urgency.  Musculoskeletal: Positive for arthralgias. Negative for back pain and myalgias.  Skin: Negative for pallor and rash.  Allergic/Immunologic: Negative for environmental allergies.  Neurological: Negative for dizziness, syncope and headaches.  Hematological: Negative for adenopathy. Does not bruise/bleed easily.  Psychiatric/Behavioral: Negative for decreased concentration and dysphoric mood. The patient is not nervous/anxious.        Objective:   Physical Exam Constitutional:      General: She is not in acute distress.    Appearance: Normal appearance. She is well-developed. She is obese. She is not ill-appearing or diaphoretic.  HENT:     Head: Normocephalic and atraumatic.     Right Ear: Tympanic membrane, ear canal and external ear normal.     Left Ear: Tympanic membrane, ear canal and external ear normal.     Nose: Nose normal. No congestion.     Mouth/Throat:     Mouth: Mucous membranes are moist.     Pharynx: Oropharynx is clear. No posterior oropharyngeal erythema.  Eyes:     General: No scleral icterus.    Extraocular Movements: Extraocular movements intact.     Conjunctiva/sclera: Conjunctivae normal.     Pupils: Pupils are equal, round, and reactive to light.  Neck:     Thyroid: No thyromegaly.     Vascular: No carotid bruit or JVD.  Cardiovascular:     Rate and Rhythm: Normal rate and regular rhythm.     Pulses: Normal pulses.     Heart sounds: Normal heart sounds. No gallop.   Pulmonary:     Effort: Pulmonary effort is normal. No respiratory distress.     Breath sounds:  Normal breath sounds. No wheezing.     Comments: Good air exch Chest:     Chest wall: No tenderness.  Abdominal:     General: Bowel sounds are normal. There is no distension or abdominal bruit.     Palpations: Abdomen is soft. There is no mass.     Tenderness: There is no abdominal tenderness.     Hernia:  No hernia is present.  Genitourinary:    Comments: Breast exam: No mass, nodules, thickening, tenderness, bulging, retraction, inflamation, nipple discharge or skin changes noted.  No axillary or clavicular LA.     Musculoskeletal:        General: No tenderness. Normal range of motion.     Cervical back: Normal range of motion and neck supple. No rigidity. No muscular tenderness.     Right lower leg: No edema.     Left lower leg: No edema.  Lymphadenopathy:     Cervical: No cervical adenopathy.  Skin:    General: Skin is warm and dry.     Coloration: Skin is not pale.     Findings: No erythema or rash.     Comments: Few skin tags   Neurological:     Mental Status: She is alert. Mental status is at baseline.     Cranial Nerves: No cranial nerve deficit.     Motor: No abnormal muscle tone.     Coordination: Coordination normal.     Gait: Gait normal.     Deep Tendon Reflexes: Reflexes are normal and symmetric. Reflexes normal.  Psychiatric:        Mood and Affect: Mood normal.        Cognition and Memory: Cognition and memory normal.           Assessment & Plan:   Problem List Items Addressed This Visit      Cardiovascular and Mediastinum   Essential hypertension    bp in fair control at this time  BP Readings from Last 1 Encounters:  07/29/20 136/80   No changes needed Most recent labs reviewed  Disc lifstyle change with low sodium diet and exercise  Plan to continue amlodipine 5 mg daily        Relevant Medications   amLODipine (NORVASC) 5 MG tablet   Hemorrhoids    Improved Uses anusol hc supp prn      Relevant Medications   amLODipine (NORVASC) 5 MG tablet     Digestive   GERD    Pt would like to try getting off ppi again  Taking omeprazole every other day  If rebound gerd lasts longer than a week when trying to stop I inst her to get back on it  H2 blocker is also an option       Relevant Medications   omeprazole (PRILOSEC) 20 MG capsule      Other   Encounter for general adult medical examination with abnormal findings - Primary    Reviewed health habits including diet and exercise and skin cancer prevention Reviewed appropriate screening tests for age  Also reviewed health mt list, fam hx and immunization status , as well as social and family history   See hPO New B12 and vit D def-will begin treatment  Discussed shingrix vaccine-interested if covered Given # to schedule her mammogram  Ref done to GI for her 5 y recall colonoscopy        Obesity    Discussed how this problem influences overall health and the risks it  imposes  Reviewed plan for weight loss with lower calorie diet (via better food choices and also portion control or program like weight watchers) and exercise building up to or more than 30 minutes 5 days per week including some aerobic activity         Prediabetes    Lab Results  Component Value Date   HGBA1C 6.6 (H) 07/16/2020   Up from 6.2 In dm range Declines metformin or other med Re check 3 mo with diet change  Discussed low glycemic diet        Relevant Orders   Hemoglobin A1c   Hyperlipidemia, mild    Disc goals for lipids and reasons to control them Rev last labs with pt Rev low sat fat diet in detail LDL stable in 120s   If she stays in DM range will discuss a statin go get to LDL of below 70 Unsure if she will be interested in that       Relevant Medications   amLODipine (NORVASC) 5 MG tablet   Current use of proton pump inhibitor    Both B12 and vit D are low Will make plan to replace Pt is again trying to wean off of ppi      Vitamin B12 deficiency    In pt taking ppi  Lab Results  Component Value Date   VITAMINB12 201 (L) 07/16/2020   Has had first of 4 weekly b12 shots  Also adv to take 500 mcg daily otc  Will re check level in 3 mo       Relevant Orders   Vitamin B12   Vitamin D deficiency    Low D at 14.0  Disc imp to bone and overall health  Pt takes  ppi  Px ergocalciferol 50,000 iu weekly for 12 weeks Also take 2000 iu daily        Relevant Orders   VITAMIN D 25 Hydroxy (Vit-D Deficiency, Fractures)   Colon cancer screening    Due for 5 y recall  Last colonoscopy was diagnostic for bleeding (hemorrhoids)  Referral done to GI  Polyps in the past      Relevant Orders   Ambulatory referral to Gastroenterology

## 2020-07-29 NOTE — Assessment & Plan Note (Signed)
Due for 5 y recall  Last colonoscopy was diagnostic for bleeding (hemorrhoids)  Referral done to GI  Polyps in the past

## 2020-07-29 NOTE — Assessment & Plan Note (Signed)
Discussed how this problem influences overall health and the risks it imposes  Reviewed plan for weight loss with lower calorie diet (via better food choices and also portion control or program like weight watchers) and exercise building up to or more than 30 minutes 5 days per week including some aerobic activity    

## 2020-07-29 NOTE — Assessment & Plan Note (Signed)
Reviewed health habits including diet and exercise and skin cancer prevention Reviewed appropriate screening tests for age  Also reviewed health mt list, fam hx and immunization status , as well as social and family history   See hPO New B12 and vit D def-will begin treatment  Discussed shingrix vaccine-interested if covered Given # to schedule her mammogram  Ref done to GI for her 5 y recall colonoscopy

## 2020-07-29 NOTE — Assessment & Plan Note (Signed)
Disc goals for lipids and reasons to control them Rev last labs with pt Rev low sat fat diet in detail LDL stable in 120s   If she stays in DM range will discuss a statin go get to LDL of below 70 Unsure if she will be interested in that

## 2020-07-29 NOTE — Assessment & Plan Note (Signed)
In pt taking ppi  Lab Results  Component Value Date   VITAMINB12 201 (L) 07/16/2020   Has had first of 4 weekly b12 shots  Also adv to take 500 mcg daily otc  Will re check level in 3 mo

## 2020-07-29 NOTE — Assessment & Plan Note (Signed)
bp in fair control at this time  BP Readings from Last 1 Encounters:  07/29/20 136/80   No changes needed Most recent labs reviewed  Disc lifstyle change with low sodium diet and exercise  Plan to continue amlodipine 5 mg daily

## 2020-07-29 NOTE — Patient Instructions (Addendum)
If you are interested in the shingles vaccine series (Shingrix), call your insurance or pharmacy to check on coverage and location it must be given.  If affordable - you can schedule it here or at your pharmacy depending on coverage   Go ahead and get your mammogram at the breast center   Let's do a B12 shot weekly starting in approx a week  Take vitamin B12 500 mcg daily   Let's re check your B12 level in 3 months with a1c   Take the 50,000 iu of vitamin D weekly for 12 weeks  In addition to that take 2000 iu over the counter vitamin D3 every day from now on   For blood sugar  Try to get most of your carbohydrates from produce (with the exception of white potatoes)  Eat less bread/pasta/rice/snack foods/cereals/sweets and other items from the middle of the grocery store (processed carbs)  Get your eye exam when it is due , have them send [place] a copy

## 2020-07-29 NOTE — Assessment & Plan Note (Signed)
Pt would like to try getting off ppi again  Taking omeprazole every other day  If rebound gerd lasts longer than a week when trying to stop I inst her to get back on it  H2 blocker is also an option

## 2020-07-29 NOTE — Assessment & Plan Note (Signed)
Lab Results  Component Value Date   HGBA1C 6.6 (H) 07/16/2020   Up from 6.2 In dm range Declines metformin or other med Re check 3 mo with diet change  Discussed low glycemic diet

## 2020-08-14 ENCOUNTER — Ambulatory Visit (INDEPENDENT_AMBULATORY_CARE_PROVIDER_SITE_OTHER): Payer: BC Managed Care – PPO

## 2020-08-14 ENCOUNTER — Other Ambulatory Visit: Payer: Self-pay

## 2020-08-14 DIAGNOSIS — E538 Deficiency of other specified B group vitamins: Secondary | ICD-10-CM | POA: Diagnosis not present

## 2020-08-14 MED ORDER — CYANOCOBALAMIN 1000 MCG/ML IJ SOLN
1000.0000 ug | Freq: Once | INTRAMUSCULAR | Status: AC
Start: 2020-08-14 — End: 2020-08-14
  Administered 2020-08-14: 1000 ug via INTRAMUSCULAR

## 2020-08-14 NOTE — Progress Notes (Signed)
Per orders of Dr. Sharlene Motts, injection of vit I71 given by Brenton Grills. Patient tolerated injection well.

## 2020-08-19 ENCOUNTER — Other Ambulatory Visit: Payer: Self-pay | Admitting: Family Medicine

## 2020-08-21 ENCOUNTER — Other Ambulatory Visit: Payer: Self-pay

## 2020-08-21 ENCOUNTER — Ambulatory Visit (INDEPENDENT_AMBULATORY_CARE_PROVIDER_SITE_OTHER): Payer: BC Managed Care – PPO | Admitting: *Deleted

## 2020-08-21 DIAGNOSIS — E538 Deficiency of other specified B group vitamins: Secondary | ICD-10-CM | POA: Diagnosis not present

## 2020-08-21 MED ORDER — CYANOCOBALAMIN 1000 MCG/ML IJ SOLN
1000.0000 ug | Freq: Once | INTRAMUSCULAR | Status: AC
  Administered 2020-08-21: 1000 ug via INTRAMUSCULAR

## 2020-08-21 NOTE — Progress Notes (Signed)
Per orders of Allie Bossier, NP, injection of B12 given by Tammi Sou. Patient tolerated injection well.   PCP out of the office this afternoon

## 2020-08-25 ENCOUNTER — Other Ambulatory Visit: Payer: Self-pay | Admitting: Family Medicine

## 2020-08-25 DIAGNOSIS — Z1231 Encounter for screening mammogram for malignant neoplasm of breast: Secondary | ICD-10-CM

## 2020-08-28 ENCOUNTER — Ambulatory Visit (INDEPENDENT_AMBULATORY_CARE_PROVIDER_SITE_OTHER): Payer: BC Managed Care – PPO | Admitting: *Deleted

## 2020-08-28 ENCOUNTER — Ambulatory Visit
Admission: RE | Admit: 2020-08-28 | Discharge: 2020-08-28 | Disposition: A | Discharge status: Home / Self Care | Payer: BC Managed Care – PPO | Source: Ambulatory Visit | Attending: Family Medicine | Admitting: Family Medicine

## 2020-08-28 ENCOUNTER — Other Ambulatory Visit: Payer: Self-pay

## 2020-08-28 DIAGNOSIS — E538 Deficiency of other specified B group vitamins: Secondary | ICD-10-CM | POA: Diagnosis not present

## 2020-08-28 DIAGNOSIS — Z1231 Encounter for screening mammogram for malignant neoplasm of breast: Secondary | ICD-10-CM | POA: Diagnosis not present

## 2020-08-28 MED ORDER — CYANOCOBALAMIN 1000 MCG/ML IJ SOLN
1000.0000 ug | Freq: Once | INTRAMUSCULAR | Status: AC
  Administered 2020-08-28: 1000 ug via INTRAMUSCULAR

## 2020-08-28 NOTE — Progress Notes (Addendum)
Per orders of Dr. Glori Bickers, injection of Vitamin B12 given by Lauralyn Primes. Patient tolerated injection well.

## 2020-09-12 ENCOUNTER — Other Ambulatory Visit: Payer: Self-pay

## 2020-09-12 ENCOUNTER — Encounter: Payer: Self-pay | Admitting: Internal Medicine

## 2020-09-12 ENCOUNTER — Ambulatory Visit: Payer: BC Managed Care – PPO | Admitting: Internal Medicine

## 2020-09-12 ENCOUNTER — Encounter: Payer: Self-pay | Admitting: Gastroenterology

## 2020-09-12 NOTE — Progress Notes (Signed)
The patient showed up to visit thinking that we were GI and somehow had been scheduled TOC visit but wishes to keep her PCP whom she has had for some time. The visit was canceled. And PCP will remain Dr. Glori Bickers.

## 2020-09-18 ENCOUNTER — Ambulatory Visit: Payer: BC Managed Care – PPO | Admitting: Family Medicine

## 2020-09-18 ENCOUNTER — Other Ambulatory Visit: Payer: Self-pay

## 2020-09-18 ENCOUNTER — Encounter: Payer: Self-pay | Admitting: Family Medicine

## 2020-09-18 DIAGNOSIS — H1130 Conjunctival hemorrhage, unspecified eye: Secondary | ICD-10-CM | POA: Diagnosis not present

## 2020-09-18 NOTE — Patient Instructions (Signed)
This looks like a benign subconjunctival hemorrhage and it should resolve.  Take care.  Glad to see you.

## 2020-09-18 NOTE — Progress Notes (Signed)
This visit occurred during the SARS-CoV-2 public health emergency.  Safety protocols were in place, including screening questions prior to the visit, additional usage of staff PPE, and extensive cleaning of exam room while observing appropriate contact time as indicated for disinfecting solutions.  Eye discoloration.  R eye changes, only R eye.  No trauma.  No gritty feeling.  No vision changes.  No L eye sx other than some watering from allergies.  The white of my right eye was blood red."  It is getting better.  Going on for about 2 weeks, slowly getting better. No bleeding o/w except for occ hemorrhoids.   Meds, vitals, and allergies reviewed.   ROS: Per HPI unless specifically indicated in ROS section   Nad Ncat PERRL EOMI nondilated funduscopic exam without any abnormality noted. Conjunctiva normal B except for resolving subconjunctival hemorrhage inferior to the R iris. Neck supple no LA

## 2020-09-21 DIAGNOSIS — H1130 Conjunctival hemorrhage, unspecified eye: Secondary | ICD-10-CM | POA: Insufficient documentation

## 2020-09-21 NOTE — Assessment & Plan Note (Signed)
Reassured, no ominous findings, should resolve.  Update [place] as needed.  Anatomy discussed with patient.  She agrees with plan.

## 2020-10-19 ENCOUNTER — Emergency Department (HOSPITAL_COMMUNITY): Payer: BC Managed Care – PPO

## 2020-10-19 ENCOUNTER — Other Ambulatory Visit: Payer: Self-pay

## 2020-10-19 ENCOUNTER — Emergency Department (HOSPITAL_COMMUNITY)
Admission: EM | Admit: 2020-10-19 | Discharge: 2020-10-19 | Disposition: A | Discharge status: Home / Self Care | Payer: BC Managed Care – PPO | Attending: Emergency Medicine | Admitting: Emergency Medicine

## 2020-10-19 ENCOUNTER — Encounter (HOSPITAL_COMMUNITY): Payer: Self-pay | Admitting: Emergency Medicine

## 2020-10-19 DIAGNOSIS — R531 Weakness: Secondary | ICD-10-CM | POA: Insufficient documentation

## 2020-10-19 DIAGNOSIS — R197 Diarrhea, unspecified: Secondary | ICD-10-CM | POA: Diagnosis not present

## 2020-10-19 DIAGNOSIS — I10 Essential (primary) hypertension: Secondary | ICD-10-CM | POA: Diagnosis not present

## 2020-10-19 DIAGNOSIS — Z87891 Personal history of nicotine dependence: Secondary | ICD-10-CM | POA: Insufficient documentation

## 2020-10-19 DIAGNOSIS — A084 Viral intestinal infection, unspecified: Secondary | ICD-10-CM

## 2020-10-19 DIAGNOSIS — Z8669 Personal history of other diseases of the nervous system and sense organs: Secondary | ICD-10-CM | POA: Insufficient documentation

## 2020-10-19 DIAGNOSIS — Z86010 Personal history of colonic polyps: Secondary | ICD-10-CM | POA: Diagnosis not present

## 2020-10-19 DIAGNOSIS — Z79899 Other long term (current) drug therapy: Secondary | ICD-10-CM | POA: Diagnosis not present

## 2020-10-19 DIAGNOSIS — R519 Headache, unspecified: Secondary | ICD-10-CM | POA: Diagnosis not present

## 2020-10-19 LAB — URINALYSIS, ROUTINE W REFLEX MICROSCOPIC
Bilirubin Urine: NEGATIVE
Glucose, UA: NEGATIVE mg/dL
Hgb urine dipstick: NEGATIVE
Ketones, ur: NEGATIVE mg/dL
Leukocytes,Ua: NEGATIVE
Nitrite: NEGATIVE
Protein, ur: NEGATIVE mg/dL
Specific Gravity, Urine: 1.026 (ref 1.005–1.030)
pH: 5 (ref 5.0–8.0)

## 2020-10-19 LAB — COMPREHENSIVE METABOLIC PANEL
ALT: 17 U/L (ref 0–44)
AST: 18 U/L (ref 15–41)
Albumin: 3.4 g/dL — ABNORMAL LOW (ref 3.5–5.0)
Alkaline Phosphatase: 65 U/L (ref 38–126)
Anion gap: 8 (ref 5–15)
BUN: 13 mg/dL (ref 6–20)
CO2: 26 mmol/L (ref 22–32)
Calcium: 9 mg/dL (ref 8.9–10.3)
Chloride: 105 mmol/L (ref 98–111)
Creatinine, Ser: 0.81 mg/dL (ref 0.44–1.00)
GFR, Estimated: >60 mL/min (ref >60)
Glucose, Bld: 109 mg/dL — ABNORMAL HIGH (ref 70–99)
Potassium: 3.4 mmol/L — ABNORMAL LOW (ref 3.5–5.1)
Sodium: 139 mmol/L (ref 135–145)
Total Bilirubin: 0.4 mg/dL (ref 0.3–1.2)
Total Protein: 6.8 g/dL (ref 6.5–8.1)

## 2020-10-19 LAB — CBC WITH DIFFERENTIAL/PLATELET
Abs Immature Granulocytes: 0.01 10*3/uL (ref 0.00–0.07)
Basophils Absolute: 0.1 10*3/uL (ref 0.0–0.1)
Basophils Relative: 1 %
Eosinophils Absolute: 0.4 10*3/uL (ref 0.0–0.5)
Eosinophils Relative: 6 %
HCT: 36.8 % (ref 36.0–46.0)
Hemoglobin: 11.5 g/dL — ABNORMAL LOW (ref 12.0–15.0)
Immature Granulocytes: 0 %
Lymphocytes Relative: 41 %
Lymphs Abs: 2.7 10*3/uL (ref 0.7–4.0)
MCH: 25.9 pg — ABNORMAL LOW (ref 26.0–34.0)
MCHC: 31.3 g/dL (ref 30.0–36.0)
MCV: 82.9 fL (ref 80.0–100.0)
Monocytes Absolute: 0.7 10*3/uL (ref 0.1–1.0)
Monocytes Relative: 10 %
Neutro Abs: 2.7 10*3/uL (ref 1.7–7.7)
Neutrophils Relative %: 42 %
Platelets: 292 10*3/uL (ref 150–400)
RBC: 4.44 MIL/uL (ref 3.87–5.11)
RDW: 14.2 % (ref 11.5–15.5)
WBC: 6.5 10*3/uL (ref 4.0–10.5)
nRBC: 0 % (ref 0.0–0.2)

## 2020-10-19 MED ORDER — DIPHENHYDRAMINE HCL 50 MG/ML IJ SOLN
25.0000 mg | Freq: Once | INTRAMUSCULAR | Status: AC
  Administered 2020-10-19: 25 mg via INTRAVENOUS
  Filled 2020-10-19: qty 1

## 2020-10-19 MED ORDER — PROCHLORPERAZINE EDISYLATE 10 MG/2ML IJ SOLN
10.0000 mg | Freq: Once | INTRAMUSCULAR | Status: AC
  Administered 2020-10-19: 10 mg via INTRAVENOUS
  Filled 2020-10-19: qty 2

## 2020-10-19 MED ORDER — LOPERAMIDE HCL 2 MG PO CAPS
2.0000 mg | ORAL_CAPSULE | Freq: Once | ORAL | Status: AC
  Administered 2020-10-19: 2 mg via ORAL
  Filled 2020-10-19: qty 1

## 2020-10-19 MED ORDER — SODIUM CHLORIDE 0.9 % IV BOLUS
1000.0000 mL | Freq: Once | INTRAVENOUS | Status: AC
  Administered 2020-10-19: 1000 mL via INTRAVENOUS

## 2020-10-19 NOTE — Discharge Instructions (Addendum)
You were evaluated in the Emergency Department and after careful evaluation, we did not find any emergent condition requiring admission or further testing in the hospital.  Your symptoms are likely due to a viral stomach virus. Continue drinking plenty of fluids, start reintroducing foods slowly with the bland diet (see attached handout for examples). Monitor for signs of severe dehydration including severe weakness, no urine output, etc   Your blood pressure was also elevated today. Please take your prescribed medicines as directed and follow up with your primary care doctor   Please return to the Emergency Department if you experience any worsening of your condition.  We encourage you to follow up with a primary care provider.  Thank you for allowing [place] to be a part of your care.

## 2020-10-19 NOTE — ED Provider Notes (Signed)
Emergency Medicine Provider Triage Evaluation Note  Crystal Tucker , a 60 y.o. female  was evaluated in triage.  Pt complains of diarrhea.  Patient reports since Friday she has been numerous watery stools, has hemorrhoids with some bleeding at baseline has not seen increased blood.  Reports some intermittent abdominal cramping but no persistent pain.  No vomiting.  Reports she got her COVID booster on Friday and did not know if that could be related although diarrhea started before this.  No fevers or known sick contacts.  No recent antibiotics.  Also complains of headache today  Review of Systems  Positive: Diarrhea, headache Negative: Fever, vomiting, abdominal pain  Physical Exam  BP (!) 168/94 (BP Location: Left Arm)   Pulse 80   Temp 98.7 F (37.1 C) (Oral)   Resp 18   SpO2 95%  Gen:   Awake, no distress   Resp:  Normal effort  MSK:   Moves extremities without difficulty  Other:  Abdomen soft, nondistended, nontender throughout  Medical Decision Making  Medically screening exam initiated at 12:47 AM.  Appropriate orders placed.  Dennette Hedeen Kroeker was informed that the remainder of the evaluation will be completed by another provider, this initial triage assessment does not replace that evaluation, and the importance of remaining in the ED until their evaluation is complete.     Jacqlyn Larsen, PA-C 10/19/20 [id number]    Ripley Fraise, MD 10/19/20 [phone]

## 2020-10-19 NOTE — ED Notes (Signed)
Pt ambulated to the bathroom with minimal assistance. Expressed need for urine sample. Urine sample cup provided.

## 2020-10-19 NOTE — ED Provider Notes (Signed)
[facility] EMERGENCY DEPARTMENT Provider Note   CSN: [id number] Arrival date & time: 10/19/20  0042     History Chief Complaint  Patient presents with   Diarrhea    Crystal Tucker is a 60 y.o. female.  HPI 60 year old female with a history of anemia, cardiac valve prolapse, diverticulitis, hypertension, tubular adenoma of the colon presents to the ER with complaints of diarrhea for the last 2 days.  Patient states that she started to have watery diarrhea on Friday, which did seem to improve with fluids and Imodium.  She states that she then went to work, had something to eat but then started to have symptoms again last night.  Denies any blood.  No nausea, fevers, abdominal pain.  She her roommate let floor in the bathroom with underwear on.  She may have fallen and hit her head.  Reportedly she was acting confused and her son was concerned that she was not "speaking right".  She states that she has no other symptoms since then.  She came to the ER to be "checked out".  Has had no recent travel, no recent antibiotics, no known foods that she thinks caused it.  She did receive a COVID booster on Friday and does not know if this is also contributing to her symptoms.  No known sick contacts.  Complaining of a headache, states she has a history of migraines.  No vision changes, nausea or vomiting.    Past Medical History:  Diagnosis Date   Allergy    Anemia    NOS   Arthritis    Cardiac valve prolapse    leaking valve with surgery 1998   Contact with or exposure to venereal diseases    Diverticulosis of colon (without mention of hemorrhage)    Edema    GERD (gastroesophageal reflux disease)    H/O: pneumonia    Hypertension    Pneumonia    Pruritus of genital organs    Rash and other nonspecific skin eruption    Sleep apnea    Tubular adenoma of colon    Unspecified symptom associated with female genital organs     Patient Active Problem List   Diagnosis  Date Noted   Subconjunctival hemorrhage 09/21/2020   Vitamin B12 deficiency 07/29/2020   Vitamin D deficiency 07/29/2020   Colon cancer screening 07/29/2020   Current use of proton pump inhibitor 07/16/2020   Low back pain radiating to left leg 04/10/2020   Chronic pain of left thumb 04/10/2020   Flatus 10/31/2019   Dizziness 09/24/2019   History of heart surgery 09/24/2019   Hemorrhoids 12/11/2018   Hyperlipidemia, mild 11/04/2016   Encounter for screening for HIV 11/03/2016   Encounter for hepatitis C screening test for low risk patient 11/03/2016   Encounter for screening mammogram for breast cancer 11/03/2015   Prediabetes 11/03/2015   Herpes zoster 10/28/2015   Obesity 11/10/2011   Encounter for general adult medical examination with abnormal findings 11/01/2011   Allergic rhinitis 07/16/2011   OSA (obstructive sleep apnea) 07/16/2011   DEPENDENT EDEMA, LEGS, BILATERAL 10/11/2008   Essential hypertension 10/26/2006   GERD 10/26/2006   Diverticulosis of large intestine 10/26/2006   COLONIC POLYPS, HX OF 10/26/2006    Past Surgical History:  Procedure Laterality Date   [place]   COLONOSCOPY  7/03   polyps and tics   DIAGNOSTIC LAPAROSCOPY     KNEE ARTHROSCOPY     right   KNEE SURGERY  10/04   LAPAROSCOPY N/A 11/12/2014   Procedure: LAPAROSCOPY DIAGNOSTIC;  Surgeon: Molli Posey, MD;  Location: [facility];  Service: Gynecology;  Laterality: N/A;   PARTIAL HYSTERECTOMY     POLYPECTOMY     RECTAL POLYPECTOMY  8/07   ROTATOR CUFF REPAIR Right    2   SALPINGOOPHORECTOMY Right 11/12/2014   Procedure: SALPINGO OOPHORECTOMY;  Surgeon: Molli Posey, MD;  Location: [facility];  Service: Gynecology;  Laterality: Right;   WRIST SURGERY Right    x 2     OB History     Gravida  1   Para  1   Term  1   Preterm      AB      Living  1      SAB      IAB      Ectopic      Multiple      Live Births              Family History  Problem  Relation Age of Onset   Heart disease Maternal Grandmother    Cancer Paternal Aunt        type unknown   Cerebral aneurysm Paternal Aunt    Cerebral aneurysm Cousin    Colon polyps Mother    Diabetes Mother    Hypertension Mother    Diabetes Maternal Aunt    Heart disease Maternal Aunt    Heart disease Maternal Uncle    Cancer Paternal Aunt        x 2 , types unknown   Colon cancer Neg Hx    Esophageal cancer Neg Hx    Rectal cancer Neg Hx    Stomach cancer Neg Hx     Social History   Tobacco Use   Smoking status: Former    Packs/day: 0.80    Years: 30.00    Pack years: 24.00    Types: Cigarettes    Quit date: 03/01/2002    Years since quitting: 18.6   Smokeless tobacco: Never  Substance Use Topics   Alcohol use: Yes    Alcohol/week: 0.0 standard drinks    Comment: rarely   Drug use: No    Home Medications Prior to Admission medications   Medication Sig Start Date End Date Taking? Authorizing Provider  Acetaminophen (TYLENOL ARTHRITIS EXT RELIEF PO) Take 2 tablets by mouth daily.    [provider]  albuterol (PROAIR HFA) 108 (90 Base) MCG/ACT inhaler Inhale 2 puffs into the lungs every 6 (six) hours as needed for wheezing or shortness of breath. 10/20/16   Tower, Wynelle Fanny, MD  amLODipine (NORVASC) 5 MG tablet TAKE 1 TABLET BY MOUTH ONCE DAILY 07/29/20   Tower, Wynelle Fanny, MD  ergocalciferol (VITAMIN D2) 1.25 MG (50000 UT) capsule Take 1 capsule (50,000 Units total) by mouth once a week. 07/29/20   Tower, Wynelle Fanny, MD  hydrocortisone (ANUSOL-HC) 25 MG suppository Place 1 suppository (25 mg total) rectally at bedtime. 10/31/19   Tower, Wynelle Fanny, MD  methocarbamol (ROBAXIN) 500 MG tablet Take 1 tablet (500 mg total) by mouth every 8 (eight) hours as needed for muscle spasms (back pain). Watch out for sedation 04/10/20   Tower, Roque Lias A, MD  mometasone (NASONEX) 50 MCG/ACT nasal spray Place 2 sprays into the nose daily. 10/31/19   Tower, Wynelle Fanny, MD  omeprazole (PRILOSEC) 20  MG capsule Take 1 capsule (20 mg total) by mouth daily. 07/29/20   Tower, Wynelle Fanny, MD  potassium chloride  SA (KLOR-CON) 20 MEQ tablet Take 1 tablet (20 mEq total) by mouth daily. 07/29/20   Tower, Wynelle Fanny, MD    Allergies    Lisinopril and Other  Review of Systems   Review of Systems Ten systems reviewed and are negative for acute change, except as noted in the HPI.   Physical Exam Updated Vital Signs BP (!) 165/77   Pulse 74   Temp 98.7 F (37.1 C) (Oral)   Resp 18   SpO2 99%   Physical Exam Vitals and nursing note reviewed.  Constitutional:      General: She is not in acute distress.    Appearance: She is well-developed. She is not ill-appearing or diaphoretic.  HENT:     Head: Normocephalic and atraumatic.     Mouth/Throat:     Mouth: Mucous membranes are moist.     Pharynx: Oropharynx is clear.  Eyes:     Conjunctiva/sclera: Conjunctivae normal.  Cardiovascular:     Rate and Rhythm: Normal rate and regular rhythm.     Heart sounds: No murmur heard. Pulmonary:     Effort: Pulmonary effort is normal. No respiratory distress.     Breath sounds: Normal breath sounds.  Abdominal:     Palpations: Abdomen is soft.     Tenderness: There is no abdominal tenderness.  Musculoskeletal:        General: Normal range of motion.     Cervical back: Neck supple.  Skin:    General: Skin is warm and dry.  Neurological:     General: No focal deficit present.     Mental Status: She is alert and oriented to person, place, and time.     Cranial Nerves: No cranial nerve deficit.     Sensory: No sensory deficit.     Motor: No weakness.     Comments: Mental Status:  Alert, thought content appropriate, able to give a coherent history. Speech fluent without evidence of aphasia. Able to follow 2 step commands without difficulty.  Cranial Nerves:  II:  Peripheral visual fields grossly normal, pupils equal, round, reactive to light III,IV, VI: ptosis not present, extra-ocular motions intact  bilaterally  V,VII: smile symmetric, facial light touch sensation equal VIII: hearing grossly normal to voice  X: uvula elevates symmetrically  XI: bilateral shoulder shrug symmetric and strong XII: midline tongue extension without fassiculations Motor:  Normal tone. 5/5 strength of BUE and BLE major muscle groups including strong and equal grip strength and dorsiflexion/plantar flexion Sensory: light touch normal in all extremities. Cerebellar: normal finger-to-nose with bilateral upper extremities, Romberg sign absent Gait: normal gait and balance. Able to walk on toes and heels with ease.     Psychiatric:        Mood and Affect: Mood normal.        Behavior: Behavior normal.    ED Results / Procedures / Treatments   Labs (all labs ordered are listed, but only abnormal results are displayed) Labs Reviewed  COMPREHENSIVE METABOLIC PANEL - Abnormal; Notable for the following components:      Result Value   Potassium 3.4 (*)    Glucose, Bld 109 (*)    Albumin 3.4 (*)    All other components within normal limits  CBC WITH DIFFERENTIAL/PLATELET - Abnormal; Notable for the following components:   Hemoglobin 11.5 (*)    MCH 25.9 (*)    All other components within normal limits  URINALYSIS, ROUTINE W REFLEX MICROSCOPIC - Abnormal; Notable for the following components:  APPearance HAZY (*)    All other components within normal limits  C DIFFICILE QUICK SCREEN W PCR REFLEX    GASTROINTESTINAL PANEL BY PCR, STOOL (REPLACES STOOL CULTURE)    EKG None  Radiology CT HEAD WO CONTRAST (5MM)  Result Date: 10/19/2020 CLINICAL DATA:  Delirium, headache EXAM: CT HEAD WITHOUT CONTRAST TECHNIQUE: Contiguous axial images were obtained from the base of the skull through the vertex without intravenous contrast. COMPARISON:  None. FINDINGS: Brain: No evidence of acute infarction, hemorrhage, hydrocephalus, extra-axial collection or mass lesion/mass effect. Vascular: Atherosclerotic  calcifications involving the large vessels of the skull base. No unexpected hyperdense vessel. Skull: Normal. Negative for fracture or focal lesion. Sinuses/Orbits: No acute finding. Other: None. IMPRESSION: No acute intracranial findings. Electronically Signed   By: Davina Poke D.O.   On: 10/19/2020 12:53    Procedures Procedures   Medications Ordered in ED Medications  sodium chloride 0.9 % bolus 1,000 mL (1,000 mLs Intravenous New Bag/Given 10/19/20 1258)  loperamide (IMODIUM) capsule 2 mg (2 mg Oral Given 10/19/20 1255)  prochlorperazine (COMPAZINE) injection 10 mg (10 mg Intravenous Given 10/19/20 1256)  diphenhydrAMINE (BENADRYL) injection 25 mg (25 mg Intravenous Given 10/19/20 1255)    ED Course  I have reviewed the triage vital signs and the nursing notes.  Pertinent labs & imaging results that were available during my care of the patient were reviewed by me and considered in my medical decision making (see chart for details).    MDM Rules/Calculators/A&P                           60 year old female who presents to the ER with weakness, diarrhea. On arrival, she is well appearing, in no acute distress, resting comfortably in ER bed.  Patient is slightly hypertensive on arrival with a blood pressure 168/94, however not tachycardic, tachypneic or hypoxic.  Physical exam is unremarkable, abdomen is soft and nontender.  She does not appear to be hypovolemic.  DDx includes viral gastroenteritis, diverticulitis, bacterial colitis, IBD, ICH, concussion  Labs with mild hypokalemia and hypoalbuminemia likely consistent with dehydration.  CBC without leukocytosis.  UA unremarkable.  Plan for GI panel with the patient to provide a stool.  Plan for CT of the head given patient reports a fall with head injury and confusion, however suspicion for intracranial bleed is low as she has no deficits at this time.  Plan for IV fluids, headache cocktail and reevaluation.   CT head unremarkable.  Patient feeling better after migraine cocktails and fluids. Given no abdominal tenderness, low suspicion for diverticulitis or acute abdomen.   Discussed supportive care, continuing fluid hydration, reintroduction of foods with bland diet, return precautions.  Encourage PCP follow-up.  She was understanding and is agreeable.  Stable for discharge.   Final Clinical Impression(s) / ED Diagnoses Final diagnoses:  Viral gastroenteritis    Rx / DC Orders ED Discharge Orders     None        Garald Balding, PA-C 10/19/20 1407    Jeanell Sparrow, DO 10/19/20 2206

## 2020-10-19 NOTE — ED Triage Notes (Signed)
Pt here from home with c/o h/a and diarrhea , pt thinks that may have had a reaction to her booster shot on Friday

## 2020-10-28 ENCOUNTER — Encounter: Payer: Self-pay | Admitting: Family Medicine

## 2020-10-28 ENCOUNTER — Other Ambulatory Visit: Payer: Self-pay

## 2020-10-28 ENCOUNTER — Ambulatory Visit: Payer: BC Managed Care – PPO | Admitting: Family Medicine

## 2020-10-28 VITALS — BP 126/84 | HR 72 | Temp 97.8°F | Ht 67.0 in | Wt 226.4 lb

## 2020-10-28 DIAGNOSIS — K649 Unspecified hemorrhoids: Secondary | ICD-10-CM | POA: Diagnosis not present

## 2020-10-28 DIAGNOSIS — R7303 Prediabetes: Secondary | ICD-10-CM | POA: Diagnosis not present

## 2020-10-28 DIAGNOSIS — E538 Deficiency of other specified B group vitamins: Secondary | ICD-10-CM

## 2020-10-28 DIAGNOSIS — R197 Diarrhea, unspecified: Secondary | ICD-10-CM

## 2020-10-28 DIAGNOSIS — J0100 Acute maxillary sinusitis, unspecified: Secondary | ICD-10-CM

## 2020-10-28 DIAGNOSIS — E559 Vitamin D deficiency, unspecified: Secondary | ICD-10-CM

## 2020-10-28 DIAGNOSIS — S060XAA Concussion with loss of consciousness status unknown, initial encounter: Secondary | ICD-10-CM | POA: Insufficient documentation

## 2020-10-28 DIAGNOSIS — S060X9A Concussion with loss of consciousness of unspecified duration, initial encounter: Secondary | ICD-10-CM | POA: Insufficient documentation

## 2020-10-28 DIAGNOSIS — M5440 Lumbago with sciatica, unspecified side: Secondary | ICD-10-CM

## 2020-10-28 DIAGNOSIS — S060X0S Concussion without loss of consciousness, sequela: Secondary | ICD-10-CM

## 2020-10-28 LAB — CBC WITH DIFFERENTIAL/PLATELET
Basophils Absolute: 0.1 10*3/uL (ref 0.0–0.1)
Basophils Relative: 1.3 % (ref 0.0–3.0)
Eosinophils Absolute: 0.2 10*3/uL (ref 0.0–0.7)
Eosinophils Relative: 2.9 % (ref 0.0–5.0)
HCT: 38 % (ref 36.0–46.0)
Hemoglobin: 12 g/dL (ref 12.0–15.0)
Lymphocytes Relative: 30.8 % (ref 12.0–46.0)
Lymphs Abs: 2 10*3/uL (ref 0.7–4.0)
MCHC: 31.6 g/dL (ref 30.0–36.0)
MCV: 81.3 fl (ref 78.0–100.0)
Monocytes Absolute: 0.4 10*3/uL (ref 0.1–1.0)
Monocytes Relative: 6.6 % (ref 3.0–12.0)
Neutro Abs: 3.8 10*3/uL (ref 1.4–7.7)
Neutrophils Relative %: 58.4 % (ref 43.0–77.0)
Platelets: 329 10*3/uL (ref 150.0–400.0)
RBC: 4.68 Mil/uL (ref 3.87–5.11)
RDW: 14.4 % (ref 11.5–15.5)
WBC: 6.5 10*3/uL (ref 4.0–10.5)

## 2020-10-28 LAB — VITAMIN B12: Vitamin B-12: >1550 pg/mL — ABNORMAL HIGH (ref 211–911)

## 2020-10-28 LAB — BASIC METABOLIC PANEL
BUN: 14 mg/dL (ref 6–23)
CO2: 29 mEq/L (ref 19–32)
Calcium: 9.3 mg/dL (ref 8.4–10.5)
Chloride: 102 mEq/L (ref 96–112)
Creatinine, Ser: 0.77 mg/dL (ref 0.40–1.20)
GFR: 83.76 mL/min (ref >60.00)
Glucose, Bld: 106 mg/dL — ABNORMAL HIGH (ref 70–99)
Potassium: 4 mEq/L (ref 3.5–5.1)
Sodium: 140 mEq/L (ref 135–145)

## 2020-10-28 LAB — VITAMIN D 25 HYDROXY (VIT D DEFICIENCY, FRACTURES): VITD: 32.79 ng/mL (ref 30.00–100.00)

## 2020-10-28 LAB — HEMOGLOBIN A1C: Hgb A1c MFr Bld: 6.8 % — ABNORMAL HIGH (ref 4.6–6.5)

## 2020-10-28 MED ORDER — FLUTICASONE PROPIONATE 50 MCG/ACT NA SUSP: 2.0000 | Freq: Every day | NASAL | 6 refills | Status: DC

## 2020-10-28 NOTE — Assessment & Plan Note (Signed)
Worse again Pt desires ref to GI to eval  Has colonoscopy upcoming but not a visit  Will refer

## 2020-10-28 NOTE — Assessment & Plan Note (Signed)
Seen in ER  Reassuring w/u Reviewed hospital records, lab results and studies in detail   Enc fluids  Lab today incl bmp for lytes (she is having cramps)  Replace prn  Stool studies ordered in light of length of illness Using immodium prn

## 2020-10-28 NOTE — Assessment & Plan Note (Signed)
A1C today  Eating a little better disc imp of low glycemic diet and wt loss to prevent DM2   Has had a GI illness lately

## 2020-10-28 NOTE — Patient Instructions (Addendum)
Watch for increased head pain or dizziness or mental status change   Labs today  I will order stool studies also   I cannot start you on an antibiotic for a sinus infection until you get your stool sample  Use your saline  Warm compress on face may help Start using flonase daily   Labs today for diarrhea and vitamin levels and a1c and electrolytes /potassium  Immodium once daily as needed is ok  Keep up great fluid intake   Follow up in a week   I think you may need to be seen by GI for hemorrhoids  I will work on that

## 2020-10-28 NOTE — Progress Notes (Signed)
Subjective:    Patient ID: Crystal Tucker, female    DOB: 23-Apr-1960, 60 y.o.   MRN: [id number]  This visit occurred during the SARS-CoV-2 public health emergency.  Safety protocols were in place, including screening questions prior to the visit, additional usage of staff PPE, and extensive cleaning of exam room while observing appropriate contact time as indicated for disinfecting solutions.   HPI Pt presents for f/u of GI illness and head injury  Also mentions sinus symptoms and back pain and hemorrhoids   Wt Readings from Last 3 Encounters:  10/28/20 226 lb 6 oz (102.7 kg)  09/18/20 228 lb (103.4 kg)  09/12/20 225 lb 12.8 oz (102.4 kg)   35.46 kg/m  She was seen on 8/14 at [facility] for diarrhea (in setting of h/o diverticulosis) And fall/head injury (slipped in bathroom)  Ate up Thursday night and the next day watery diarrhea Went to work and then got her 2nd covid booster Was up all night long- diarrhea  Sat am- started drinking gatorade and lots of water  Sat night got up to go to the bathroom - fell coming back and hit her head  She seemed confused when trying to get dressed and at one point felt like she could not breathe (? Distressed)  Does not remember the whole experience   No nausea or no fever    No one else got sick who ate the fish   Work up included labs and CT of the head   CT HEAD WO CONTRAST (5MM)  Result Date: 10/19/2020 CLINICAL DATA:  Delirium, headache EXAM: CT HEAD WITHOUT CONTRAST TECHNIQUE: Contiguous axial images were obtained from the base of the skull through the vertex without intravenous contrast. COMPARISON:  None. FINDINGS: Brain: No evidence of acute infarction, hemorrhage, hydrocephalus, extra-axial collection or mass lesion/mass effect. Vascular: Atherosclerotic calcifications involving the large vessels of the skull base. No unexpected hyperdense vessel. Skull: Normal. Negative for fracture or focal lesion. Sinuses/Orbits: No acute finding.  Other: None. IMPRESSION: No acute intracranial findings. Electronically Signed   By: Davina Poke D.O.   On: 10/19/2020 12:53   Reassuring   No abd tenderness noted   Lab Results  Component Value Date   CREATININE 0.81 10/19/2020   BUN 13 10/19/2020   NA 139 10/19/2020   K 3.4 (L) 10/19/2020   CL 105 10/19/2020   CO2 26 10/19/2020  GFR over 60 Lab Results  Component Value Date   ALT 17 10/19/2020   AST 18 10/19/2020   ALKPHOS 65 10/19/2020   BILITOT 0.4 10/19/2020   Glucose 105 random Lab Results  Component Value Date   WBC 6.5 10/19/2020   HGB 11.5 (L) 10/19/2020   HCT 36.8 10/19/2020   MCV 82.9 10/19/2020   PLT 292 10/19/2020   Ua negative (but hazy)  Treated with loperamids and procloroperazine and benadryl with fluids   Hemorrhoids are worse  Bleed/hurt and prolapse This occurred before diarrhea but now even worse  Desires GI ref   Some sinus trouble before all this happened  Some sinus pain  Green nasal discharge  Nasal saline  Vics vapor rub   Hemorrhoids bother   More leg cramping  Eating bananas and drinking gatorade   Back is bothering her   Has been eating better  Due for a1c  Less salt   Patient Active Problem List   Diagnosis Date Noted   Concussion 10/28/2020   Diarrhea 10/28/2020   Subconjunctival hemorrhage 09/21/2020  Vitamin B12 deficiency 07/29/2020   Vitamin D deficiency 07/29/2020   Colon cancer screening 07/29/2020   Current use of proton pump inhibitor 07/16/2020   Low back pain radiating to left leg 04/10/2020   Chronic pain of left thumb 04/10/2020   Flatus 10/31/2019   Dizziness 09/24/2019   History of heart surgery 09/24/2019   Hemorrhoids 12/11/2018   Hyperlipidemia, mild 11/04/2016   Encounter for screening for HIV 11/03/2016   Encounter for hepatitis C screening test for low risk patient 11/03/2016   Encounter for screening mammogram for breast cancer 11/03/2015   Prediabetes 11/03/2015   Herpes zoster  10/28/2015   Acute sinusitis 02/20/2013   Obesity 11/10/2011   Encounter for general adult medical examination with abnormal findings 11/01/2011   Allergic rhinitis 07/16/2011   OSA (obstructive sleep apnea) 07/16/2011   DEPENDENT EDEMA, LEGS, BILATERAL 10/11/2008   Essential hypertension 10/26/2006   GERD 10/26/2006   Diverticulosis of large intestine 10/26/2006   COLONIC POLYPS, HX OF 10/26/2006   Past Medical History:  Diagnosis Date   Allergy    Anemia    NOS   Arthritis    Cardiac valve prolapse    leaking valve with surgery 1998   Contact with or exposure to venereal diseases    Diverticulosis of colon (without mention of hemorrhage)    Edema    GERD (gastroesophageal reflux disease)    H/O: pneumonia    Hypertension    Pneumonia    Pruritus of genital organs    Rash and other nonspecific skin eruption    Sleep apnea    Tubular adenoma of colon    Unspecified symptom associated with female genital organs    Past Surgical History:  Procedure Laterality Date   [place]   COLONOSCOPY  7/03   polyps and tics   DIAGNOSTIC LAPAROSCOPY     KNEE ARTHROSCOPY     right   KNEE SURGERY  10/04   LAPAROSCOPY N/A 11/12/2014   Procedure: LAPAROSCOPY DIAGNOSTIC;  Surgeon: Molli Posey, MD;  Location: [facility];  Service: Gynecology;  Laterality: N/A;   PARTIAL HYSTERECTOMY     POLYPECTOMY     RECTAL POLYPECTOMY  8/07   ROTATOR CUFF REPAIR Right    2   SALPINGOOPHORECTOMY Right 11/12/2014   Procedure: SALPINGO OOPHORECTOMY;  Surgeon: Molli Posey, MD;  Location: [facility];  Service: Gynecology;  Laterality: Right;   WRIST SURGERY Right    x 2   Social History   Tobacco Use   Smoking status: Former    Packs/day: 0.80    Years: 30.00    Pack years: 24.00    Types: Cigarettes    Quit date: 03/01/2002    Years since quitting: 18.6   Smokeless tobacco: Never  Substance Use Topics   Alcohol use: Yes    Alcohol/week: 0.0 standard drinks    Comment:  rarely   Drug use: No   Family History  Problem Relation Age of Onset   Heart disease Maternal Grandmother    Cancer Paternal Aunt        type unknown   Cerebral aneurysm Paternal Aunt    Cerebral aneurysm Cousin    Colon polyps Mother    Diabetes Mother    Hypertension Mother    Diabetes Maternal Aunt    Heart disease Maternal Aunt    Heart disease Maternal Uncle    Cancer Paternal Aunt        x 2 , types unknown  Colon cancer Neg Hx    Esophageal cancer Neg Hx    Rectal cancer Neg Hx    Stomach cancer Neg Hx    Allergies  Allergen Reactions   Lisinopril     cough   Other Rash    All citrus- lemons, oranges, grapefruit, watermelon, cantelope - all fruits with juices    Current Outpatient Medications on File Prior to Visit  Medication Sig Dispense Refill   Acetaminophen (TYLENOL ARTHRITIS EXT RELIEF PO) Take 2 tablets by mouth daily.     albuterol (PROAIR HFA) 108 (90 Base) MCG/ACT inhaler Inhale 2 puffs into the lungs every 6 (six) hours as needed for wheezing or shortness of breath. 18 g 5   amLODipine (NORVASC) 5 MG tablet TAKE 1 TABLET BY MOUTH ONCE DAILY 90 tablet 3   ergocalciferol (VITAMIN D2) 1.25 MG (50000 UT) capsule Take 1 capsule (50,000 Units total) by mouth once a week. 12 capsule 0   hydrocortisone (ANUSOL-HC) 25 MG suppository Place 1 suppository (25 mg total) rectally at bedtime. 12 suppository 3   methocarbamol (ROBAXIN) 500 MG tablet Take 1 tablet (500 mg total) by mouth every 8 (eight) hours as needed for muscle spasms (back pain). Watch out for sedation 30 tablet 1   mometasone (NASONEX) 50 MCG/ACT nasal spray Place 2 sprays into the nose daily. 1 each 12   omeprazole (PRILOSEC) 20 MG capsule Take 1 capsule (20 mg total) by mouth daily. 90 capsule 3   potassium chloride SA (KLOR-CON) 20 MEQ tablet Take 1 tablet (20 mEq total) by mouth daily. 90 tablet 3   No current facility-administered medications on file prior to visit.    Review of Systems   Constitutional:  Positive for fatigue. Negative for activity change, appetite change, fever and unexpected weight change.  HENT:  Negative for congestion, ear pain, rhinorrhea, sinus pressure, sore throat and trouble swallowing.   Eyes:  Negative for pain, redness and visual disturbance.  Respiratory:  Negative for cough, shortness of breath and wheezing.   Cardiovascular:  Negative for chest pain and palpitations.  Gastrointestinal:  Positive for diarrhea. Negative for abdominal distention, abdominal pain, anal bleeding, blood in stool, constipation, nausea, rectal pain and vomiting.  Endocrine: Negative for polydipsia and polyuria.  Genitourinary:  Negative for dysuria, frequency and urgency.  Musculoskeletal:  Positive for myalgias. Negative for arthralgias and back pain.       Had some cramping and muscle soreness I legs  Skin:  Negative for pallor and rash.  Allergic/Immunologic: Negative for environmental allergies.  Neurological:  Negative for dizziness, syncope and headaches.  Hematological:  Negative for adenopathy. Does not bruise/bleed easily.  Psychiatric/Behavioral:  Negative for decreased concentration and dysphoric mood. The patient is not nervous/anxious.       Objective:   Physical Exam Constitutional:      General: She is not in acute distress.    Appearance: Normal appearance. She is well-developed. She is obese. She is not ill-appearing or diaphoretic.  HENT:     Head: Normocephalic and atraumatic.     Right Ear: Tympanic membrane, ear canal and external ear normal. There is no impacted cerumen.     Left Ear: Tympanic membrane, ear canal and external ear normal. There is no impacted cerumen.     Nose: Congestion present.     Mouth/Throat:     Mouth: Mucous membranes are moist.     Pharynx: Oropharynx is clear. No oropharyngeal exudate or posterior oropharyngeal erythema.  Eyes:  General:        Right eye: No discharge.        Left eye: No discharge.      Conjunctiva/sclera: Conjunctivae normal.     Pupils: Pupils are equal, round, and reactive to light.  Neck:     Thyroid: No thyromegaly.     Vascular: No carotid bruit or JVD.  Cardiovascular:     Rate and Rhythm: Normal rate and regular rhythm.     Heart sounds: Normal heart sounds.    No gallop.  Pulmonary:     Effort: Pulmonary effort is normal. No respiratory distress.     Breath sounds: Normal breath sounds. No wheezing or rales.  Abdominal:     General: Bowel sounds are normal. There is no distension or abdominal bruit.     Palpations: Abdomen is soft. There is no mass.     Tenderness: There is no abdominal tenderness. There is no guarding or rebound.  Musculoskeletal:     Cervical back: Normal range of motion and neck supple. No tenderness.     Right lower leg: No edema.     Left lower leg: No edema.     Comments: Slow gait due to back pain   Lymphadenopathy:     Cervical: No cervical adenopathy.  Skin:    General: Skin is warm and dry.     Coloration: Skin is not jaundiced or pale.     Findings: No erythema or rash.  Neurological:     Mental Status: She is alert.     Cranial Nerves: Cranial nerves are intact. No cranial nerve deficit or dysarthria.     Sensory: Sensation is intact.     Motor: Motor function is intact. No weakness, tremor, atrophy, abnormal muscle tone or pronator drift.     Coordination: Coordination is intact. Romberg sign negative. Coordination normal.     Gait: Gait is intact.     Deep Tendon Reflexes: Reflexes are normal and symmetric. Reflexes normal.     Comments: No focal neuro findings   Psychiatric:        Mood and Affect: Mood normal.          Assessment & Plan:   Problem List Items Addressed This Visit       Cardiovascular and Mediastinum   Hemorrhoids    Worse again Pt desires ref to GI to eval  Has colonoscopy upcoming but not a visit  Will refer       Relevant Orders   Ambulatory referral to Gastroenterology      Respiratory   Acute sinusitis    With sinus pressure and pain and green nasal mucous and congestion  Urged to continue saline Sent in flonase  Pending labs/stool tests for diarrhea (suspect unrelated)  -once stool sample is in can px abx  Pt is aware abx may worsen diarrhea       Relevant Medications   fluticasone (FLONASE) 50 MCG/ACT nasal spray     Other   Prediabetes    A1C today  Eating a little better disc imp of low glycemic diet and wt loss to prevent DM2   Has had a GI illness lately      Relevant Orders   Hemoglobin A1c   Back pain    Worse recently  In setting of GI illness and some other acute problems  F/u planned 1 wk to eval further      Vitamin B12 deficiency    Had 4 B12 shots  Level today      Relevant Orders   Vitamin B12   Vitamin D deficiency    Level was very low at 14 Took high dose weekly tx for 12 weeks  Now otc daily  Pending result -will advise Disc imp to bone and overall health  Thinks she has more muscle pain and cramps since finishing px  Also has GI illness currently      Relevant Orders   VITAMIN D 25 Hydroxy (Vit-D Deficiency, Fractures)   Concussion    Recent head injury with re assuring ct scan Reviewed hospital records, lab results and studies in detail   Now still some residual headache and mental fuzziness Non focal exam susp post concussion syndrome and handout given  Brain rest recommended if able  Small threshold to repeat CT if any symptoms worsen or if MS change or new symptoms  Of note- no LOC but pt cannot remember whole event       Diarrhea - Primary    Seen in ER  Reassuring w/u Reviewed hospital records, lab results and studies in detail   Enc fluids  Lab today incl bmp for lytes (she is having cramps)  Replace prn  Stool studies ordered in light of length of illness Using immodium prn       Relevant Orders   Basic metabolic panel   CBC with Differential/Platelet   Stool culture   C. difficile  GDH and Toxin A/B

## 2020-10-28 NOTE — Assessment & Plan Note (Signed)
Worse recently  In setting of GI illness and some other acute problems  F/u planned 1 wk to eval further

## 2020-10-28 NOTE — Assessment & Plan Note (Signed)
Had 4 B12 shots  Level today

## 2020-10-28 NOTE — Assessment & Plan Note (Signed)
With sinus pressure and pain and green nasal mucous and congestion  Urged to continue saline Sent in flonase  Pending labs/stool tests for diarrhea (suspect unrelated)  -once stool sample is in can px abx  Pt is aware abx may worsen diarrhea

## 2020-10-28 NOTE — Assessment & Plan Note (Signed)
Level was very low at 14 Took high dose weekly tx for 12 weeks  Now otc daily  Pending result -will advise Disc imp to bone and overall health  Thinks she has more muscle pain and cramps since finishing px  Also has GI illness currently

## 2020-10-28 NOTE — Assessment & Plan Note (Signed)
Recent head injury with re assuring ct scan Reviewed hospital records, lab results and studies in detail   Now still some residual headache and mental fuzziness Non focal exam susp post concussion syndrome and handout given  Brain rest recommended if able  Small threshold to repeat CT if any symptoms worsen or if MS change or new symptoms  Of note- no LOC but pt cannot remember whole event

## 2020-10-29 ENCOUNTER — Other Ambulatory Visit: Payer: BC Managed Care – PPO

## 2020-10-29 ENCOUNTER — Telehealth: Payer: Self-pay | Admitting: Family Medicine

## 2020-10-29 DIAGNOSIS — R197 Diarrhea, unspecified: Secondary | ICD-10-CM

## 2020-10-29 MED ORDER — AZITHROMYCIN 250 MG PO TABS: ORAL_TABLET | ORAL | 0 refills | Status: DC

## 2020-10-29 NOTE — Addendum Note (Signed)
Addended by: Ellamae Sia on: 10/29/2020 10:32 AM   Modules accepted: Orders

## 2020-10-29 NOTE — Telephone Encounter (Signed)
I sent zpak to her pharmacy for sinusitis   Thanks

## 2020-10-29 NOTE — Telephone Encounter (Signed)
-----   Message from Tammi Sou, [place] sent at 10/29/2020  4:44 PM EDT ----- Pt has dropped off stool sample today, please advise on sinus inf before I call pt

## 2020-10-30 LAB — C. DIFFICILE GDH AND TOXIN A/B
GDH ANTIGEN: NOT DETECTED
MICRO NUMBER:: 12285614
SPECIMEN QUALITY:: ADEQUATE
TOXIN A AND B: NOT DETECTED

## 2020-10-30 MED ORDER — ERGOCALCIFEROL 1.25 MG (50000 UT) PO CAPS: 50000.0000 [IU] | ORAL_CAPSULE | ORAL | 0 refills | Status: DC

## 2020-10-30 MED ORDER — FLUCONAZOLE 150 MG PO TABS: 150.0000 mg | ORAL_TABLET | Freq: Once | ORAL | 0 refills | Status: AC

## 2020-10-30 NOTE — Telephone Encounter (Signed)
Pt notified of lab results and Dr. Marliss Coots comments. Pt also notified abx sent to pharmacy, Rx for vit d sent also. Pt did ask if PCP can send in diflucan as discussed, spoke with Dr. Glori Bickers and she okayed the diflucan Rx also.

## 2020-10-30 NOTE — Telephone Encounter (Signed)
-----   Message from Abner Greenspan, MD sent at 10/28/2020  8:19 PM EDT ----- D level is in normal range but I would like it a bit higher  Please refill ergocalciferol for 12 weeks  B12 is high so hold off on any further B12 shots for now-will discuss at f/u K and lytes are normal (no supplement needed)  A1C for blood sugar is up to 6.8  Will discuss at her 1 week follow up  Try and reduce sugar and processed carbs in diet  Cbc is normal  Let me know when she drops off stool sample and we can treat sinus infection

## 2020-10-31 LAB — SALMONELLA/SHIGELLA CULT, CAMPY EIA AND SHIGA TOXIN RFL ECOLI
MICRO NUMBER: 12285578
MICRO NUMBER:: 12285579
MICRO NUMBER:: 12285580
Result:: NOT DETECTED
SHIGA RESULT:: NOT DETECTED
SPECIMEN QUALITY: ADEQUATE
SPECIMEN QUALITY:: ADEQUATE
SPECIMEN QUALITY:: ADEQUATE

## 2020-11-03 ENCOUNTER — Encounter: Payer: Self-pay | Admitting: Family Medicine

## 2020-11-03 ENCOUNTER — Ambulatory Visit: Payer: BC Managed Care – PPO | Admitting: Family Medicine

## 2020-11-03 ENCOUNTER — Telehealth: Payer: Self-pay | Admitting: Family Medicine

## 2020-11-03 ENCOUNTER — Other Ambulatory Visit: Payer: Self-pay

## 2020-11-03 VITALS — BP 143/90 | HR 79 | Temp 98.2°F | Ht 67.0 in | Wt 227.0 lb

## 2020-11-03 DIAGNOSIS — E538 Deficiency of other specified B group vitamins: Secondary | ICD-10-CM | POA: Diagnosis not present

## 2020-11-03 DIAGNOSIS — I10 Essential (primary) hypertension: Secondary | ICD-10-CM

## 2020-11-03 DIAGNOSIS — R197 Diarrhea, unspecified: Secondary | ICD-10-CM | POA: Diagnosis not present

## 2020-11-03 DIAGNOSIS — R7303 Prediabetes: Secondary | ICD-10-CM

## 2020-11-03 DIAGNOSIS — E559 Vitamin D deficiency, unspecified: Secondary | ICD-10-CM

## 2020-11-03 DIAGNOSIS — S060X0S Concussion without loss of consciousness, sequela: Secondary | ICD-10-CM

## 2020-11-03 DIAGNOSIS — J0100 Acute maxillary sinusitis, unspecified: Secondary | ICD-10-CM | POA: Diagnosis not present

## 2020-11-03 DIAGNOSIS — K649 Unspecified hemorrhoids: Secondary | ICD-10-CM

## 2020-11-03 NOTE — Assessment & Plan Note (Signed)
BP is up  BP: (!) 143/90    She ate country ham  Disc DASH eating  Will check bp at home  F/u 3 mo  Consider adding tx if needed

## 2020-11-03 NOTE — Assessment & Plan Note (Signed)
Taking zpak Starting to improve Also nasonex Update if not starting to improve in a week or if worsening

## 2020-11-03 NOTE — Assessment & Plan Note (Signed)
D level improved to 32.7 Another 12 wk course of ergocalciferol sent

## 2020-11-03 NOTE — Assessment & Plan Note (Addendum)
Improved  Back to normal for the most part  Pt thinks current mild HA is from sinus pressure and HTN   Will continue to watch ER precautions noted

## 2020-11-03 NOTE — Progress Notes (Signed)
Subjective:    Patient ID: Crystal Tucker, female    DOB: May 04, 1960, 60 y.o.   MRN: [id number]  This visit occurred during the SARS-CoV-2 public health emergency.  Safety protocols were in place, including screening questions prior to the visit, additional usage of staff PPE, and extensive cleaning of exam room while observing appropriate contact time as indicated for disinfecting solutions.   HPI Pt presents for f/u of diarrhea and sinusitis and B12 def and concussion   Wt Readings from Last 3 Encounters:  11/03/20 227 lb (103 kg)  10/28/20 226 lb 6 oz (102.7 kg)  09/18/20 228 lb (103.4 kg)   35.55 kg/m  Diarrhea  Improved Some urgency to go after eating  Stool is formed  Sunday hem bleeding  Still on abx however    Lab Results  Component Value Date   CREATININE 0.77 10/28/2020   BUN 14 10/28/2020   NA 140 10/28/2020   K 4.0 10/28/2020   CL 102 10/28/2020   CO2 29 10/28/2020   Lab Results  Component Value Date   WBC 6.5 10/28/2020   HGB 12.0 10/28/2020   HCT 38.0 10/28/2020   MCV 81.3 10/28/2020   PLT 329.0 10/28/2020   C diff test -negative Stool culture -negative   Has GI appt with Dr Silverio Decamp on 9/28 for hemorrhoids   Vit D def , level was 32.79  Improved  Px another 12 weeks or ergocalciferol  B12 def -after 4 B12 shots Lab Results  Component Value Date   VITAMINB12 >1550 (H) 10/28/2020  Will hold off on more B12 shots    Sinusitis  Tx with azithromycin 5d  Improving some /had a HA this am Back to using nasonex daily   BP Readings from Last 3 Encounters:  11/03/20 (!) 143/90  10/28/20 126/84  10/19/20 (!) 174/80   Bp is up today  Ate country ham  Took her medicine  Pulse Readings from Last 3 Encounters:  11/03/20 79  10/28/20 72  10/19/20 65   Prediabetes Lab Results  Component Value Date   HGBA1C 6.8 (H) 10/28/2020   This is up   Diet:  Has been drinking orange soda  Also one coke per day  Plans to stop   Walking  regularly for exercise   Patient Active Problem List   Diagnosis Date Noted   Concussion 10/28/2020   Diarrhea 10/28/2020   Subconjunctival hemorrhage 09/21/2020   Vitamin B12 deficiency 07/29/2020   Vitamin D deficiency 07/29/2020   Colon cancer screening 07/29/2020   Current use of proton pump inhibitor 07/16/2020   Back pain 04/10/2020   Chronic pain of left thumb 04/10/2020   Flatus 10/31/2019   Dizziness 09/24/2019   History of heart surgery 09/24/2019   Hemorrhoids 12/11/2018   Hyperlipidemia, mild 11/04/2016   Encounter for screening for HIV 11/03/2016   Encounter for hepatitis C screening test for low risk patient 11/03/2016   Encounter for screening mammogram for breast cancer 11/03/2015   Prediabetes 11/03/2015   Herpes zoster 10/28/2015   Acute sinusitis 02/20/2013   Obesity 11/10/2011   Encounter for general adult medical examination with abnormal findings 11/01/2011   Allergic rhinitis 07/16/2011   OSA (obstructive sleep apnea) 07/16/2011   DEPENDENT EDEMA, LEGS, BILATERAL 10/11/2008   Essential hypertension 10/26/2006   GERD 10/26/2006   Diverticulosis of large intestine 10/26/2006   COLONIC POLYPS, HX OF 10/26/2006   Past Medical History:  Diagnosis Date   Allergy    Anemia  NOS   Arthritis    Cardiac valve prolapse    leaking valve with surgery 1998   Contact with or exposure to venereal diseases    Diverticulosis of colon (without mention of hemorrhage)    Edema    GERD (gastroesophageal reflux disease)    H/O: pneumonia    Hypertension    Pneumonia    Pruritus of genital organs    Rash and other nonspecific skin eruption    Sleep apnea    Tubular adenoma of colon    Unspecified symptom associated with female genital organs    Past Surgical History:  Procedure Laterality Date   CARDIAC VALVE SURGERY  1998   COLONOSCOPY  7/03   polyps and tics   DIAGNOSTIC LAPAROSCOPY     KNEE ARTHROSCOPY     right   KNEE SURGERY  10/04   LAPAROSCOPY  N/A 11/12/2014   Procedure: LAPAROSCOPY DIAGNOSTIC;  Surgeon: Molli Posey, MD;  Location: [facility];  Service: Gynecology;  Laterality: N/A;   PARTIAL HYSTERECTOMY     POLYPECTOMY     RECTAL POLYPECTOMY  8/07   ROTATOR CUFF REPAIR Right    2   SALPINGOOPHORECTOMY Right 11/12/2014   Procedure: SALPINGO OOPHORECTOMY;  Surgeon: Molli Posey, MD;  Location: [facility];  Service: Gynecology;  Laterality: Right;   WRIST SURGERY Right    x 2   Social History   Tobacco Use   Smoking status: Former    Packs/day: 0.80    Years: 30.00    Pack years: 24.00    Types: Cigarettes    Quit date: 03/01/2002    Years since quitting: 18.6   Smokeless tobacco: Never  Substance Use Topics   Alcohol use: Yes    Alcohol/week: 0.0 standard drinks    Comment: rarely   Drug use: No   Family History  Problem Relation Age of Onset   Heart disease Maternal Grandmother    Cancer Paternal Aunt        type unknown   Cerebral aneurysm Paternal Aunt    Cerebral aneurysm Cousin    Colon polyps Mother    Diabetes Mother    Hypertension Mother    Diabetes Maternal Aunt    Heart disease Maternal Aunt    Heart disease Maternal Uncle    Cancer Paternal Aunt        x 2 , types unknown   Colon cancer Neg Hx    Esophageal cancer Neg Hx    Rectal cancer Neg Hx    Stomach cancer Neg Hx    Allergies  Allergen Reactions   Lisinopril     cough   Other Rash    All citrus- lemons, oranges, grapefruit, watermelon, cantelope - all fruits with juices    Current Outpatient Medications on File Prior to Visit  Medication Sig Dispense Refill   Acetaminophen (TYLENOL ARTHRITIS EXT RELIEF PO) Take 2 tablets by mouth daily.     albuterol (PROAIR HFA) 108 (90 Base) MCG/ACT inhaler Inhale 2 puffs into the lungs every 6 (six) hours as needed for wheezing or shortness of breath. 18 g 5   amLODipine (NORVASC) 5 MG tablet TAKE 1 TABLET BY MOUTH ONCE DAILY 90 tablet 3   azithromycin (ZITHROMAX Z-PAK) 250 MG tablet Take 2 pills  by mouth today and then 1 pill daily for 4 days 6 tablet 0   ergocalciferol (VITAMIN D2) 1.25 MG (50000 UT) capsule Take 1 capsule (50,000 Units total) by mouth once a week. 12 capsule  0   hydrocortisone (ANUSOL-HC) 25 MG suppository Place 1 suppository (25 mg total) rectally at bedtime. 12 suppository 3   methocarbamol (ROBAXIN) 500 MG tablet Take 1 tablet (500 mg total) by mouth every 8 (eight) hours as needed for muscle spasms (back pain). Watch out for sedation 30 tablet 1   mometasone (NASONEX) 50 MCG/ACT nasal spray Place 2 sprays into the nose daily. 1 each 12   omeprazole (PRILOSEC) 20 MG capsule Take 1 capsule (20 mg total) by mouth daily. 90 capsule 3   potassium chloride SA (KLOR-CON) 20 MEQ tablet Take 1 tablet (20 mEq total) by mouth daily. 90 tablet 3   No current facility-administered medications on file prior to visit.      Review of Systems  Constitutional:  Positive for fatigue. Negative for activity change, appetite change, fever and unexpected weight change.  HENT:  Positive for sinus pressure. Negative for congestion, ear pain, rhinorrhea and sore throat.   Eyes:  Negative for pain, redness and visual disturbance.  Respiratory:  Negative for cough, shortness of breath and wheezing.   Cardiovascular:  Negative for chest pain and palpitations.  Gastrointestinal:  Negative for abdominal pain, blood in stool, constipation and diarrhea.       Urgent stools  Endocrine: Negative for polydipsia and polyuria.  Genitourinary:  Negative for dysuria, frequency and urgency.  Musculoskeletal:  Negative for arthralgias, back pain and myalgias.  Skin:  Negative for pallor and rash.  Allergic/Immunologic: Negative for environmental allergies.  Neurological:  Positive for headaches. Negative for dizziness and syncope.       Headache mild today  Thinks from HTN  Hematological:  Negative for adenopathy. Does not bruise/bleed easily.  Psychiatric/Behavioral:  Negative for decreased  concentration and dysphoric mood. The patient is not nervous/anxious.       Objective:   Physical Exam Constitutional:      General: She is not in acute distress.    Appearance: Normal appearance. She is well-developed. She is obese. She is not ill-appearing or diaphoretic.  HENT:     Head: Normocephalic and atraumatic.     Mouth/Throat:     Mouth: Mucous membranes are moist.  Eyes:     General:        Right eye: No discharge.        Left eye: No discharge.     Conjunctiva/sclera: Conjunctivae normal.     Pupils: Pupils are equal, round, and reactive to light.  Neck:     Thyroid: No thyromegaly.     Vascular: No carotid bruit or JVD.  Cardiovascular:     Rate and Rhythm: Normal rate and regular rhythm.     Heart sounds: Normal heart sounds.    No gallop.  Pulmonary:     Effort: Pulmonary effort is normal. No respiratory distress.     Breath sounds: Normal breath sounds. No wheezing or rales.  Abdominal:     General: Bowel sounds are normal. There is no distension or abdominal bruit.     Palpations: Abdomen is soft. There is no mass.     Tenderness: There is no abdominal tenderness. There is no guarding or rebound.  Musculoskeletal:     Cervical back: Normal range of motion and neck supple. No tenderness.     Right lower leg: No edema.     Left lower leg: No edema.  Lymphadenopathy:     Cervical: No cervical adenopathy.  Skin:    General: Skin is warm and dry.  Coloration: Skin is not jaundiced or pale.     Findings: No rash.  Neurological:     Mental Status: She is alert.     Coordination: Coordination normal.     Deep Tendon Reflexes: Reflexes are normal and symmetric. Reflexes normal.  Psychiatric:        Mood and Affect: Mood normal.        Cognition and Memory: Cognition and memory normal.          Assessment & Plan:   Problem List Items Addressed This Visit       Cardiovascular and Mediastinum   Essential hypertension    BP is up  BP: (!)  143/90    She ate country ham  Disc DASH eating  Will check bp at home  F/u 3 mo  Consider adding tx if needed      Hemorrhoids    Pt has upcoming GI appt        Respiratory   Acute sinusitis    Taking zpak Starting to improve Also nasonex Update if not starting to improve in a week or if worsening          Other   Prediabetes    A1C is up to 6.8  Disc diet in detail  Handouts given  F/u 3 mo   Hesitant to try metformin until diarrhea improves  Very motivated disc imp of low glycemic diet and wt loss to prevent DM2       Vitamin B12 deficiency    Lab Results  Component Value Date   VITAMINB12 >1550 (H) 10/28/2020  Holding b12 shots and oral suppl Will start back on oral in a mo      Vitamin D deficiency    D level improved to 32.7 Another 12 wk course of ergocalciferol sent       Concussion    Improved  Back to normal for the most part  Pt thinks current mild HA is from sinus pressure and HTN   Will continue to watch ER precautions noted      Diarrhea - Primary    Improved Now more or less urgency after eating  Suspect IBS Will try fiber supplement with fluids to see if helpful  GI appt is pending

## 2020-11-03 NOTE — Assessment & Plan Note (Signed)
A1C is up to 6.8  Disc diet in detail  Handouts given  F/u 3 mo   Hesitant to try metformin until diarrhea improves  Very motivated disc imp of low glycemic diet and wt loss to prevent DM2

## 2020-11-03 NOTE — Telephone Encounter (Signed)
Pt. State that she needs to know what Dr. Glori Bickers want her to do about her other 2 appt. ([place])

## 2020-11-03 NOTE — Assessment & Plan Note (Signed)
Lab Results  Component Value Date   VITAMINB12 >1550 (H) 10/28/2020   Holding b12 shots and oral suppl Will start back on oral in a mo

## 2020-11-03 NOTE — Assessment & Plan Note (Signed)
Improved Now more or less urgency after eating  Suspect IBS Will try fiber supplement with fluids to see if helpful  GI appt is pending

## 2020-11-03 NOTE — Telephone Encounter (Signed)
Does she mean the GI appts?

## 2020-11-03 NOTE — Telephone Encounter (Signed)
Patient called the office stating that she is checking on the upcoming appointments scheduled with GI. Patient stated that she thought that she needed to see the GI doctor before she has a colonoscopy.Marland Kitchen

## 2020-11-03 NOTE — Assessment & Plan Note (Signed)
Pt has upcoming GI appt

## 2020-11-03 NOTE — Telephone Encounter (Signed)
Can you check in with her re: referral  I think she has a GI appt before her colonoscopy for hemorrhoids but I cannot tell  Thanks

## 2020-11-03 NOTE — Patient Instructions (Addendum)
A fiber supplement (sugar free citrucel, benefiber or metamucil) helps with stool urgency   No more B12 shots  Hold the oral supplement for a month and start it back   Take the D as directed    For diabetes  Try to get most of your carbohydrates from produce (with the exception of white potatoes)  Eat less bread/pasta/rice/snack foods/cereals/sweets and other items from the middle of the grocery store (processed carbs)  Drink water and calorie free drinks  You can put a shot of juice in your water instead of drinking juice   We will check blood pressure next time  Watch it at home (when relaxed)  We will re check A1C (glucose)

## 2020-11-11 ENCOUNTER — Telehealth: Payer: Self-pay | Admitting: Family Medicine

## 2020-11-11 ENCOUNTER — Other Ambulatory Visit: Payer: Self-pay | Admitting: Family Medicine

## 2020-11-11 NOTE — Telephone Encounter (Signed)
error 

## 2020-11-11 NOTE — Telephone Encounter (Signed)
Pt called and wanted to follow up to see if she still needed to have her  see her colonoscopy or to see GI first Pt would like a call

## 2020-11-12 ENCOUNTER — Telehealth: Payer: Self-pay | Admitting: Family Medicine

## 2020-11-12 MED ORDER — FLUCONAZOLE 150 MG PO TABS: 150.0000 mg | ORAL_TABLET | Freq: Once | ORAL | 0 refills | Status: AC

## 2020-11-12 MED ORDER — AMOXICILLIN-POT CLAVULANATE 875-125 MG PO TABS: 1.0000 | ORAL_TABLET | Freq: Two times a day (BID) | ORAL | 0 refills | Status: DC

## 2020-11-12 NOTE — Telephone Encounter (Signed)
Crystal Tucker called in wanted to know if she could get something different due to the medication is not working it was working at first but then it stopped. And wanted to know if she can get something for her yeast infection due to she when takes antibiotics it gives her a yeast infection

## 2020-11-12 NOTE — Telephone Encounter (Signed)
I thought the first visit was to discuss hemorrhoid symptoms and see if they needed to treat those (that is a separate problem than needing the colonoscopy)

## 2020-11-12 NOTE — Telephone Encounter (Signed)
What symptoms is she still having or worsening?   Any fever or yellow/green mucous?  I sent diflucan to her pharmacy for yeast infection

## 2020-11-12 NOTE — Telephone Encounter (Signed)
I sent augmentin to her pharmacy to try for 7d If no improvement let me know

## 2020-11-12 NOTE — Telephone Encounter (Signed)
Pt notified Rx sent to pharmacy and advised of Dr. Tower's comments  

## 2020-11-12 NOTE — Telephone Encounter (Signed)
Reaching out to [facility] to see if the patient needs both appts scheduled for this month. The patient is confused on the two separate appointments and what they are for, I know one of for Colonoscopy but the other on 11/19/20 "Pre-Op " at 4:30 the patient is asking if she must keep.   Please clarify if able to the difference in the two upcoming appts.   Thanks!

## 2020-11-12 NOTE — Telephone Encounter (Signed)
Pt said she is having the same issues as when she saw Dr. Glori Bickers, sinus and congestion. Pt said both sxs did start to clear up some but never completely went away and now both sxs are worsening. Pt said she is having facial pressure, congestion and HA. Pt said she isn't blowing anything out anymore it feels like it's all stuck in her head. Pt is using nasonex with no relief. Pt has no coughing or fever

## 2020-11-13 NOTE — Telephone Encounter (Signed)
Pt notified. She has a question regarding the Hemorrhoid Dr. Glori Bickers wanted evaluated before colonoscopy she didn't know if she needed a separate visit 1st. Per [place] they are not seeing anyone urgent and she recommended to call GI and tell them about hemorrhoids or to let GI know and pre op appt about hemorrhoids. Pt verbalized understanding and will f/u with GI

## 2020-11-13 NOTE — Telephone Encounter (Signed)
Spoke with GI and they gave clarification of the upcoming appts.   "ok, one appt is for her pre-colon visit with the nurse and the other one is for her colonoscopy. When pts do not have any GI issues they bypass a consult with the physicians so they are scheduled those two appts. The pre-visit appt with the nurse involves a review of general medical hx, instructions to prepare for the procedure, and sign of documents like consent forms, etc. This visit is free of charge and is required for all pts. I hope that it helps answer your question."

## 2020-11-14 NOTE — Telephone Encounter (Signed)
Last filled on 04/10/20 #30 tab with 1 refill please advise

## 2020-12-03 ENCOUNTER — Encounter: Payer: BC Managed Care – PPO | Admitting: Gastroenterology

## 2020-12-23 ENCOUNTER — Encounter: Payer: Self-pay | Admitting: Gastroenterology

## 2020-12-23 ENCOUNTER — Ambulatory Visit (INDEPENDENT_AMBULATORY_CARE_PROVIDER_SITE_OTHER): Payer: BC Managed Care – PPO | Admitting: Gastroenterology

## 2020-12-23 VITALS — BP 164/84 | HR 64 | Ht 67.0 in | Wt 225.1 lb

## 2020-12-23 DIAGNOSIS — R143 Flatulence: Secondary | ICD-10-CM | POA: Diagnosis not present

## 2020-12-23 DIAGNOSIS — Z86010 Personal history of colonic polyps: Secondary | ICD-10-CM

## 2020-12-23 DIAGNOSIS — K625 Hemorrhage of anus and rectum: Secondary | ICD-10-CM

## 2020-12-23 DIAGNOSIS — K602 Anal fissure, unspecified: Secondary | ICD-10-CM

## 2020-12-23 DIAGNOSIS — R159 Full incontinence of feces: Secondary | ICD-10-CM

## 2020-12-23 DIAGNOSIS — R152 Fecal urgency: Secondary | ICD-10-CM

## 2020-12-23 MED ORDER — AMBULATORY NON FORMULARY MEDICATION: 1 refills | Status: DC

## 2020-12-23 MED ORDER — PLENVU 140 G PO SOLR: ORAL | 0 refills | Status: DC

## 2020-12-23 NOTE — Patient Instructions (Addendum)
You have been scheduled for a colonoscopy. Please follow written instructions given to you at your visit today.  Please pick up your prep supplies at the pharmacy within the next 1-3 days. If you use inhalers (even only as needed), please bring them with you on the day of your procedure.   Take FiberChoice 1 tablet twice a day with meals  We have sent a prescription for nitroglycerin 0.125% gel to [facility]. You should apply a pea size amount to your rectum three times daily x 6-8 weeks.  [facility]'s information is below: Address: [address] [place], [place] [address]  [phone]  *Please DO NOT go directly from our office to pick up this medication! Give the pharmacy 1 day to process the   Lactose-Free Diet, Adult If you have lactose intolerance, you are not able to digest lactose. Lactose is a natural sugar found mainly in dairy milk and dairy products. A lactose-free diet can help you avoid foods and beverages that contain lactose. What are tips for following this plan? Reading food labels Do not consume foods, beverages, vitamins, minerals, or medicines containing lactose. Read ingredient lists carefully. Look for the words "lactose-free" on labels. Meal planning Use alternatives to dairy milk and foods made with milk products. These include the following: Lactose-free milk. Soy milk with added calcium and vitamin D. Almond milk, coconut milk, rice milk, or other nondairy milk alternatives with added calcium and vitamin D. Note that a lot of these are low in protein. Soy products, such as soy yogurt, soy cheese, soy ice cream, and soy-based sour cream. Other nut milk products, such as almond yogurt, almond cheese, cashew yogurt, cashew cheese, cashew ice cream, coconut yogurt, and coconut ice cream. Medicines, vitamins, and supplements Use lactase enzyme drops or tablets as directed by your health care provider. Make sure you get enough calcium and  vitamin D in your diet. A lactose-free eating plan can be lacking in these important nutrients. Take calcium and vitamin D supplements as directed by your health care provider. Talk with your health care provider about supplements if you are not able to get enough calcium and vitamin D from food. What foods should I eat? Fruits All fresh, canned, frozen, or dried fruits and fruit juices that are not processed with lactose. Vegetables All fresh, frozen, and canned vegetables without cheese, cream, or butter sauces. Grains Any that are not made with dairy milk or dairy products. Meats and other proteins Any meat, fish, poultry, and other protein sources that are not made with dairy milk or dairy products. Fats and oils Any that are not made with dairy milk or dairy products. Sweets and desserts Any that are not made with dairy milk or dairy products. Seasonings and condiments Any that are not made with dairy milk or dairy products. Calcium Calcium is found in many foods that contain lactose and is important for bone health. The amount of calcium you need depends on your age: Adults younger than 50 years: 1,000 mg of calcium a day. Adults older than 50 years: 1,200 mg of calcium a day. If you are not getting enough calcium, you may get it from other sources, including: Orange juice that has been fortified with calcium. This means that calcium has been added to the product. There are 300-350 mg of calcium in 1 cup (237 mL) of calcium-fortified orange juice. Soy milk fortified with calcium. There are 300-400 mg of calcium in 1 cup (237 mL) of  calcium-fortified soy milk. Rice or almond milk fortified with calcium. There are 300 mg of calcium in 1 cup (237 mL) of calcium-fortified rice or almond milk. Breakfast cereals fortified with calcium. There are 100-1,000 mg of calcium in calcium-fortified breakfast cereals. Spinach, cooked. There are 145 mg of calcium in  cup (90 g) of cooked  spinach. Edamame, cooked. There are 130 mg of calcium in  cup (47 g) of cooked edamame. Collard greens, cooked. There are 125 mg of calcium in  cup (85 g) of cooked collard greens. Kale, frozen or cooked. There are 90 mg of calcium in  cup (59 g) of cooked or frozen kale. Almonds. There are 95 mg of calcium in  cup (35 g) of almonds. Broccoli, cooked. There are 60 mg of calcium in 1 cup (156 g) of cooked broccoli. The items listed above may not be a complete list of foods and beverages you can eat. Contact a dietitian for more options. What foods should I avoid? Lactose is found in dairy milk and dairy products, such as: Yogurt. Cheese. Butter. Margarine. Sour cream. Cream. Whipped toppings and creamers. Ice cream and other dairy-based desserts. Lactose is also found in foods or products made with dairy milk or milk ingredients. To find out whether a food contains dairy milk or a milk ingredient, look at the ingredients list. Avoid foods with the statement "May contain milk" and foods that contain: Milk powder. Whey. Curd. Lactose. Lactoglobulin. The items listed above may not be a complete list of foods and beverages to avoid. Contact a dietitian for more information. Where to find more information [employer] of Diabetes and Digestive and Kidney Diseases: [email or web address] Summary If you are lactose intolerant, it means that you are not able to digest lactose, a natural sugar found in milk and milk products. Following a lactose-free diet can help you manage this condition. Calcium is important for bone health and is found in many foods that contain lactose. Talk with your health care provider about other sources of calcium. This information is not intended to replace advice given to you by your health care provider. Make sure you discuss any questions you have with your health care provider. Document Revised: 01/29/2020 Document Reviewed: 01/29/2020 Elsevier Patient  Education  2022 [place]. prescription as this is compounded and takes time to make.   Due to recent changes in healthcare laws, you may see the results of your imaging and laboratory studies on MyChart before your provider has had a chance to review them.  We understand that in some cases there may be results that are confusing or concerning to you. Not all laboratory results come back in the same time frame and the provider may be waiting for multiple results in order to interpret others.  Please give [place] 48 hours in order for your provider to thoroughly review all the results before contacting the office for clarification of your results.    Thank you for choosing [facility]  Karleen Hampshire Nandigam,MD

## 2020-12-30 ENCOUNTER — Encounter: Payer: Self-pay | Admitting: Gastroenterology

## 2020-12-30 NOTE — Progress Notes (Signed)
Crystal Tucker    [id number]    1960/06/01  Primary Care Physician:Tower, Wynelle Fanny, MD  Referring Physician: Tower, Wynelle Fanny, MD [place],  [place] [address]   Chief complaint:  Rectal bleeding   HPI:  60 year old very pleasant female with complaints of rectal bleeding and symptomatic hemorrhoids.  She has intermittent episodes of rectal bleeding associated with rectal discomfort, somewhat improved in the past few weeks. She has significant abdominal bloating and excessive gas, worse after meals.  She also has occasional episodes of fecal seepage and urgency  Colonoscopy Jul 25, 2015 - One 6 mm polyp in the cecum, removed with a cold snare. Resected and retrieved.  Was benign polypoid lymphoid aggregate - Diverticulosis in the sigmoid colon, in the descending colon, in the transverse colon, in the ascending colon and in the cecum. - Non-bleeding internal hemorrhoids.  Prior to that she has had multiple colonoscopy with adenomatous colon polyps Outpatient Encounter Medications as of 12/23/2020  Medication Sig   Acetaminophen (TYLENOL ARTHRITIS EXT RELIEF PO) Take 2 tablets by mouth daily.   albuterol (PROAIR HFA) 108 (90 Base) MCG/ACT inhaler Inhale 2 puffs into the lungs every 6 (six) hours as needed for wheezing or shortness of breath.   amLODipine (NORVASC) 5 MG tablet TAKE 1 TABLET BY MOUTH ONCE DAILY   ergocalciferol (VITAMIN D2) 1.25 MG (50000 UT) capsule Take 1 capsule (50,000 Units total) by mouth once a week.   hydrocortisone (ANUSOL-HC) 25 MG suppository Place 1 suppository (25 mg total) rectally at bedtime.   methocarbamol (ROBAXIN) 500 MG tablet TAKE 1 TABLET BY MOUTH EVERY 8 HOURS AS NEEDED FOR MUSCLE SPASM (BACK  PAIN)  (WATCH  OUT  FOR  SEDATION)   mometasone (NASONEX) 50 MCG/ACT nasal spray Place 2 sprays into the nose daily.   omeprazole (PRILOSEC) 20 MG capsule Take 1 capsule (20 mg total) by mouth daily.   potassium chloride SA  (KLOR-CON) 20 MEQ tablet Take 1 tablet (20 mEq total) by mouth daily.   [DISCONTINUED] amoxicillin-clavulanate (AUGMENTIN) 875-125 MG tablet Take 1 tablet by mouth 2 (two) times daily. (Patient not taking: Reported on 12/23/2020)   [DISCONTINUED] azithromycin (ZITHROMAX Z-PAK) 250 MG tablet Take 2 pills by mouth today and then 1 pill daily for 4 days (Patient not taking: Reported on 12/23/2020)   No facility-administered encounter medications on file as of 12/23/2020.    Allergies as of 12/23/2020 - Review Complete 12/23/2020  Allergen Reaction Noted   Lisinopril  06/24/2011   Other Rash 07/11/2015    Past Medical History:  Diagnosis Date   Allergy    Anemia    NOS   Arthritis    Cardiac valve prolapse    leaking valve with surgery 1998   Contact with or exposure to venereal diseases    Diverticulosis of colon (without mention of hemorrhage)    Edema    GERD (gastroesophageal reflux disease)    H/O: pneumonia    Hypertension    Pneumonia    Pruritus of genital organs    Rash and other nonspecific skin eruption    Sleep apnea    Tubular adenoma of colon    Unspecified symptom associated with female genital organs     Past Surgical History:  Procedure Laterality Date   CARDIAC VALVE SURGERY  1998   COLONOSCOPY  7/03   polyps and tics   DIAGNOSTIC LAPAROSCOPY     KNEE ARTHROSCOPY  right   KNEE SURGERY  10/04   LAPAROSCOPY N/A 11/12/2014   Procedure: LAPAROSCOPY DIAGNOSTIC;  Surgeon: Molli Posey, MD;  Location: [facility];  Service: Gynecology;  Laterality: N/A;   PARTIAL HYSTERECTOMY     POLYPECTOMY     RECTAL POLYPECTOMY  8/07   ROTATOR CUFF REPAIR Right    2   SALPINGOOPHORECTOMY Right 11/12/2014   Procedure: SALPINGO OOPHORECTOMY;  Surgeon: Molli Posey, MD;  Location: [facility];  Service: Gynecology;  Laterality: Right;   WRIST SURGERY Right    x 2    Family History  Problem Relation Age of Onset   Colon polyps Mother    Diabetes Mother    Hypertension  Mother    Diabetes Maternal Aunt    Heart disease Maternal Aunt    Heart disease Maternal Uncle    Cancer Paternal Aunt        type unknown   Cerebral aneurysm Paternal Aunt    Cancer Paternal Aunt        x 2 , types unknown   Heart disease Maternal Grandmother    Cerebral aneurysm Cousin    Colon cancer Neg Hx    Esophageal cancer Neg Hx    Rectal cancer Neg Hx    Stomach cancer Neg Hx    Pancreatic cancer Neg Hx     Social History   Socioeconomic History   Marital status: Married    Spouse name: Not on file   Number of children: 1   Years of education: Not on file   Highest education level: Not on file  Occupational History   Occupation: caregiver  Tobacco Use   Smoking status: Former    Packs/day: 0.80    Years: 30.00    Pack years: 24.00    Types: Cigarettes    Quit date: 03/01/2002    Years since quitting: 18.8   Smokeless tobacco: Never  Vaping Use   Vaping Use: Never used  Substance and Sexual Activity   Alcohol use: Not Currently   Drug use: No   Sexual activity: Not on file  Other Topics Concern   Not on file  Social History Narrative   Married      1 child      [employer]-Laid off      Works part-time home health      Caffeine: 1 daily   Social Determinants of Radio broadcast assistant Strain: Not on file  Food Insecurity: Not on file  Transportation Needs: Not on file  Physical Activity: Not on file  Stress: Not on file  Social Connections: Not on file  Intimate Partner Violence: Not on file      Review of systems: All other review of systems negative except as mentioned in the HPI.   Physical Exam: Vitals:   12/23/20 1325  BP: (!) 164/84  Pulse: 64  SpO2: 100%   Body mass index is 35.26 kg/m. Gen:      No acute distress HEENT:  sclera anicteric Abd:      soft, non-tender; no palpable masses, no distension Ext:    No edema Neuro: alert and oriented x 3 Psych: normal mood and affect  Data Reviewed:  Reviewed labs,  radiology imaging, old records and pertinent past GI work up   Assessment and Plan/Recommendations:  60 year old female with complaints of rectal discomfort, rectal bleeding, abdominal bloating and fecal incontinence  Evidence of anal fissure on rectal exam Irritable rectal nitroglycerin 0.125% 3 times daily small pea-sized amount  per rectum for 6 to 8 weeks Avoid excessive straining with bowel movements  Use fiber choice or FiberCon tablets twice daily with meals Increase water intake  She is due for surveillance colonoscopy, will schedule colonoscopy for further evaluation and exclude any other etiology for rectal bleeding The risks and benefits as well as alternatives of endoscopic procedure(s) have been discussed and reviewed. All questions answered. The patient agrees to proceed.  Excessive gas and intermittent episodes of fecal incontinence.  Possible lactose intolerance contributing to her symptoms, will do a trial of lactose-free diet for 1 to 2 weeks  Return after colonoscopy in 2 to 3 months  This visit required 40 minutes of patient care (this includes precharting, chart review, review of results, face-to-face time used for counseling as well as treatment plan and follow-up. The patient was provided an opportunity to ask questions and all were answered. The patient agreed with the plan and demonstrated an understanding of the instructions.  Damaris Hippo , MD    CC: Tower, Wynelle Fanny, MD

## 2021-01-03 ENCOUNTER — Emergency Department (HOSPITAL_COMMUNITY): Payer: BC Managed Care – PPO

## 2021-01-03 ENCOUNTER — Encounter (HOSPITAL_COMMUNITY): Payer: Self-pay

## 2021-01-03 ENCOUNTER — Other Ambulatory Visit: Payer: Self-pay

## 2021-01-03 ENCOUNTER — Emergency Department (HOSPITAL_COMMUNITY)
Admission: EM | Admit: 2021-01-03 | Discharge: 2021-01-03 | Disposition: A | Discharge status: Home / Self Care | Payer: BC Managed Care – PPO | Attending: Emergency Medicine | Admitting: Emergency Medicine

## 2021-01-03 DIAGNOSIS — Z043 Encounter for examination and observation following other accident: Secondary | ICD-10-CM | POA: Diagnosis not present

## 2021-01-03 DIAGNOSIS — Z79899 Other long term (current) drug therapy: Secondary | ICD-10-CM | POA: Diagnosis not present

## 2021-01-03 DIAGNOSIS — M25571 Pain in right ankle and joints of right foot: Secondary | ICD-10-CM | POA: Diagnosis not present

## 2021-01-03 DIAGNOSIS — I10 Essential (primary) hypertension: Secondary | ICD-10-CM | POA: Insufficient documentation

## 2021-01-03 DIAGNOSIS — Z87891 Personal history of nicotine dependence: Secondary | ICD-10-CM | POA: Diagnosis not present

## 2021-01-03 DIAGNOSIS — M79604 Pain in right leg: Secondary | ICD-10-CM | POA: Insufficient documentation

## 2021-01-03 DIAGNOSIS — M25561 Pain in right knee: Secondary | ICD-10-CM | POA: Diagnosis not present

## 2021-01-03 DIAGNOSIS — M25461 Effusion, right knee: Secondary | ICD-10-CM | POA: Diagnosis not present

## 2021-01-03 DIAGNOSIS — M7989 Other specified soft tissue disorders: Secondary | ICD-10-CM | POA: Diagnosis not present

## 2021-01-03 MED ORDER — OXYCODONE-ACETAMINOPHEN 5-325 MG PO TABS: 1.0000 | ORAL_TABLET | Freq: Four times a day (QID) | ORAL | 0 refills | Status: DC | PRN

## 2021-01-03 NOTE — ED Triage Notes (Signed)
Pt states that she slipped on some leaves around 6 pm and fell on her right leg. Pt complains of pain from the knee down.

## 2021-01-03 NOTE — ED Provider Notes (Signed)
[facility] DEPT Provider Note   CSN: [id number] Arrival date & time: 01/03/21  2022     History Chief Complaint  Patient presents with   Leg Pain    Crystal Tucker is a 60 y.o. female.  Patient presented chief complaint of right lower extremity pain.  She states that she was fine until she tripped on some leaves today and fell onto her right knee.  Complaining of persistent pain in the right ankle and right knee.  Denies head injury or back pain or other extremity pain.  Denies loss of consciousness.  No reports of fevers or cough or vomiting or diarrhea.      Past Medical History:  Diagnosis Date   Allergy    Anemia    NOS   Arthritis    Cardiac valve prolapse    leaking valve with surgery 1998   Contact with or exposure to venereal diseases    Diverticulosis of colon (without mention of hemorrhage)    Edema    GERD (gastroesophageal reflux disease)    H/O: pneumonia    Hypertension    Pneumonia    Pruritus of genital organs    Rash and other nonspecific skin eruption    Sleep apnea    Tubular adenoma of colon    Unspecified symptom associated with female genital organs     Patient Active Problem List   Diagnosis Date Noted   Concussion 10/28/2020   Diarrhea 10/28/2020   Vitamin B12 deficiency 07/29/2020   Vitamin D deficiency 07/29/2020   Colon cancer screening 07/29/2020   Current use of proton pump inhibitor 07/16/2020   Back pain 04/10/2020   Chronic pain of left thumb 04/10/2020   Flatus 10/31/2019   Dizziness 09/24/2019   History of heart surgery 09/24/2019   Hemorrhoids 12/11/2018   Hyperlipidemia, mild 11/04/2016   Encounter for screening for HIV 11/03/2016   Encounter for hepatitis C screening test for low risk patient 11/03/2016   Encounter for screening mammogram for breast cancer 11/03/2015   Prediabetes 11/03/2015   Herpes zoster 10/28/2015   Acute sinusitis 02/20/2013   Obesity 11/10/2011   Encounter for  general adult medical examination with abnormal findings 11/01/2011   Allergic rhinitis 07/16/2011   OSA (obstructive sleep apnea) 07/16/2011   DEPENDENT EDEMA, LEGS, BILATERAL 10/11/2008   Essential hypertension 10/26/2006   GERD 10/26/2006   Diverticulosis of large intestine 10/26/2006   COLONIC POLYPS, HX OF 10/26/2006    Past Surgical History:  Procedure Laterality Date   [place]   COLONOSCOPY  7/03   polyps and tics   DIAGNOSTIC LAPAROSCOPY     KNEE ARTHROSCOPY     right   KNEE SURGERY  10/04   LAPAROSCOPY N/A 11/12/2014   Procedure: LAPAROSCOPY DIAGNOSTIC;  Surgeon: Molli Posey, MD;  Location: [facility];  Service: Gynecology;  Laterality: N/A;   PARTIAL HYSTERECTOMY     POLYPECTOMY     RECTAL POLYPECTOMY  8/07   ROTATOR CUFF REPAIR Right    2   SALPINGOOPHORECTOMY Right 11/12/2014   Procedure: SALPINGO OOPHORECTOMY;  Surgeon: Molli Posey, MD;  Location: [facility];  Service: Gynecology;  Laterality: Right;   WRIST SURGERY Right    x 2     OB History     Gravida  1   Para  1   Term  1   Preterm      AB      Living  1  SAB      IAB      Ectopic      Multiple      Live Births              Family History  Problem Relation Age of Onset   Colon polyps Mother    Diabetes Mother    Hypertension Mother    Diabetes Maternal Aunt    Heart disease Maternal Aunt    Heart disease Maternal Uncle    Cancer Paternal Aunt        type unknown   Cerebral aneurysm Paternal Aunt    Cancer Paternal Aunt        x 2 , types unknown   Heart disease Maternal Grandmother    Cerebral aneurysm Cousin    Colon cancer Neg Hx    Esophageal cancer Neg Hx    Rectal cancer Neg Hx    Stomach cancer Neg Hx    Pancreatic cancer Neg Hx     Social History   Tobacco Use   Smoking status: Former    Packs/day: 0.80    Years: 30.00    Pack years: 24.00    Types: Cigarettes    Quit date: 03/01/2002    Years since quitting: 18.8   Smokeless  tobacco: Never  Vaping Use   Vaping Use: Never used  Substance Use Topics   Alcohol use: Not Currently   Drug use: No    Home Medications Prior to Admission medications   Medication Sig Start Date End Date Taking? Authorizing Provider  oxyCODONE-acetaminophen (PERCOCET/ROXICET) 5-325 MG tablet Take 1 tablet by mouth every 6 (six) hours as needed for severe pain. 01/03/21  Yes Luna Fuse, MD  Acetaminophen (TYLENOL ARTHRITIS EXT RELIEF PO) Take 2 tablets by mouth daily.    [provider]  albuterol (PROAIR HFA) 108 (90 Base) MCG/ACT inhaler Inhale 2 puffs into the lungs every 6 (six) hours as needed for wheezing or shortness of breath. 10/20/16   Tower, Wynelle Fanny, MD  AMBULATORY NON FORMULARY MEDICATION Medication Name: Nitroglycerin ointment 0.125% use pea sized amount three times a day per rectum for 6-8 weeks 12/23/20   Mauri Pole, MD  amLODipine (NORVASC) 5 MG tablet TAKE 1 TABLET BY MOUTH ONCE DAILY 07/29/20   Tower, Wynelle Fanny, MD  ergocalciferol (VITAMIN D2) 1.25 MG (50000 UT) capsule Take 1 capsule (50,000 Units total) by mouth once a week. 10/30/20   Tower, Wynelle Fanny, MD  hydrocortisone (ANUSOL-HC) 25 MG suppository Place 1 suppository (25 mg total) rectally at bedtime. 10/31/19   Tower, Wynelle Fanny, MD  methocarbamol (ROBAXIN) 500 MG tablet TAKE 1 TABLET BY MOUTH EVERY 8 HOURS AS NEEDED FOR MUSCLE SPASM (BACK  PAIN)  (WATCH  OUT  FOR  SEDATION) 11/16/20   Tower, Wynelle Fanny, MD  mometasone (NASONEX) 50 MCG/ACT nasal spray Place 2 sprays into the nose daily. 10/31/19   Tower, Wynelle Fanny, MD  omeprazole (PRILOSEC) 20 MG capsule Take 1 capsule (20 mg total) by mouth daily. 07/29/20   Tower, Wynelle Fanny, MD  PEG-KCl-NaCl-NaSulf-Na Asc-C (PLENVU) 140 g SOLR 1 prep kit for colonoscopy prep 12/23/20   Nandigam, Venia Minks, MD  potassium chloride SA (KLOR-CON) 20 MEQ tablet Take 1 tablet (20 mEq total) by mouth daily. 07/29/20   Tower, Wynelle Fanny, MD    Allergies    Lisinopril and Other  Review of  Systems   Review of Systems  Constitutional:  Negative for fever.  HENT:  Negative  for ear pain.   Eyes:  Negative for pain.  Respiratory:  Negative for cough.   Cardiovascular:  Negative for chest pain.  Gastrointestinal:  Negative for abdominal pain.  Genitourinary:  Negative for flank pain.  Musculoskeletal:  Negative for back pain.  Skin:  Negative for rash.  Neurological:  Negative for headaches.   Physical Exam Updated Vital Signs BP (!) 177/81 (BP Location: Left Arm)   Pulse 70   Temp 98.3 F (36.8 C) (Oral)   Resp 16   Ht _0  (1.702 m)   Wt 102.1 kg   SpO2 98%   BMI 35.24 kg/m   Physical Exam Constitutional:      General: She is not in acute distress.    Appearance: Normal appearance.  HENT:     Head: Normocephalic.     Nose: Nose normal.  Eyes:     Extraocular Movements: Extraocular movements intact.  Cardiovascular:     Rate and Rhythm: Normal rate.  Pulmonary:     Effort: Pulmonary effort is normal.  Musculoskeletal:     Cervical back: Normal range of motion.     Comments: On exam right knee appears mildly swollen, no gross deformity noted.  No gross deformity to right ankle noted.  Range of motion of the right knee is within normal limits.  However patient has significant pain with attempted weightbearing of the right ankle.  Otherwise neurovascularly intact.  Compartments are soft.  No C or T or L-spine step-offs or tenderness noted.  Neurological:     General: No focal deficit present.     Mental Status: She is alert. Mental status is at baseline.    ED Results / Procedures / Treatments   Labs (all labs ordered are listed, but only abnormal results are displayed) Labs Reviewed - No data to display  EKG None  Radiology DG Ankle Complete Right  Result Date: 01/03/2021 CLINICAL DATA:  Status post fall. EXAM: RIGHT ANKLE - COMPLETE 3+ VIEW COMPARISON:  None. FINDINGS: There is no evidence of fracture, dislocation, or joint effusion. There is no  evidence of arthropathy or other focal bone abnormality. There is mild diffuse soft tissue swelling. IMPRESSION: 1. No acute osseous abnormality. 2. Mild diffuse soft tissue swelling. Electronically Signed   By: Virgina Norfolk M.D.   On: 01/03/2021 21:30   DG Knee Complete 4 Views Right  Result Date: 01/03/2021 CLINICAL DATA:  Status post fall. EXAM: RIGHT KNEE - COMPLETE 4+ VIEW COMPARISON:  None. FINDINGS: No evidence of an acute fracture or dislocation. Medial and lateral marginal osteophytes are present. Moderate severity medial and tibiofemoral compartment space narrowing is seen. A small joint effusion is noted. IMPRESSION: 1. No acute fracture or dislocation. 2. Moderate degenerative changes with a small joint effusion. Electronically Signed   By: Virgina Norfolk M.D.   On: 01/03/2021 21:29   DG Foot Complete Right  Result Date: 01/03/2021 CLINICAL DATA:  Status post fall. EXAM: RIGHT FOOT COMPLETE - 3+ VIEW COMPARISON:  None. FINDINGS: There is no evidence of fracture or dislocation. There is no evidence of arthropathy or other focal bone abnormality. Soft tissues are unremarkable. IMPRESSION: Negative. Electronically Signed   By: Virgina Norfolk M.D.   On: 01/03/2021 21:30    Procedures .Ortho Injury Treatment  Date/Time: 01/03/2021 10:40 PM Performed by: Luna Fuse, MD Authorized by: Luna Fuse, MD  Post-procedure neurovascular assessment: post-procedure neurovascularly intact Comments: Immobilizer to right knee and right ankle with crutch training.  Medications Ordered in ED Medications - No data to display  ED Course  I have reviewed the triage vital signs and the nursing notes.  Pertinent labs & imaging results that were available during my care of the patient were reviewed by me and considered in my medical decision making (see chart for details).    MDM Rules/Calculators/A&P                           X-rays of the right foot ankle and knee  completed and read by radiologist as unremarkable.  Patient given a knee immobilizer and a right ankle brace.  Given crutch training.  Discharged home in stable condition.  Advise follow-up with her doctor within the week.  Advising immediate return for worsening symptoms or any additional concerns.  Final Clinical Impression(s) / ED Diagnoses Final diagnoses:  Right leg pain    Rx / DC Orders ED Discharge Orders          Ordered    oxyCODONE-acetaminophen (PERCOCET/ROXICET) 5-325 MG tablet  Every 6 hours PRN        01/03/21 2242             Luna Fuse, MD 01/03/21 2242

## 2021-01-03 NOTE — ED Provider Notes (Signed)
Emergency Medicine Provider Triage Evaluation Note  Crystal Tucker , a 60 y.o. female  was evaluated in triage.  Pt complains of right knee, ankle and foot pain after mechanical fall that occurred prior to arrival.  She was walking outside when she slipped on a pile of leaves.  She felt like her leg twisted behind her.  She is ambulatory but reports pain worse with bearing weight.  She took a muscle relaxer prior to arrival.  States that she had ACL repair on this knee several years ago denies any other injuries or head injury.  Review of Systems  Positive: Right knee pain, ankle pain foot pain Negative: Head injury  Physical Exam  There were no vitals taken for this visit. Gen:   Awake, no distress   Resp:  Normal effort  MSK:   Moves extremities without difficulty  Other:  Tenderness to palpation of the right ankle diffusely with no deformities.  2+ DP pulse palpated.  Tenderness of the right knee without changes to range of motion or deformity  Medical Decision Making  Medically screening exam initiated at 8:48 PM.  Appropriate orders placed.  Crystal Tucker was informed that the remainder of the evaluation will be completed by another provider, this initial triage assessment does not replace that evaluation, and the importance of remaining in the ED until their evaluation is complete.  X-rays ordered   Crystal Heady, PA-C 01/03/21 2050    Luna Fuse, MD 01/07/21 [phone]

## 2021-01-03 NOTE — Discharge Instructions (Signed)
Call your primary care doctor or specialist as discussed in the next 2-3 days.   Return immediately back to the ER if:  Your symptoms worsen within the next 12-24 hours. You develop new symptoms such as new fevers, persistent vomiting, new pain, shortness of breath, or new weakness or numbness, or if you have any other concerns.  

## 2021-01-07 ENCOUNTER — Other Ambulatory Visit: Payer: Self-pay

## 2021-01-07 ENCOUNTER — Ambulatory Visit: Payer: BC Managed Care – PPO | Admitting: Family Medicine

## 2021-01-07 ENCOUNTER — Encounter: Payer: Self-pay | Admitting: Family Medicine

## 2021-01-07 VITALS — BP 142/90 | HR 60 | Temp 97.5°F | Ht 67.0 in | Wt 230.4 lb

## 2021-01-07 DIAGNOSIS — S93491A Sprain of other ligament of right ankle, initial encounter: Secondary | ICD-10-CM

## 2021-01-07 DIAGNOSIS — M1711 Unilateral primary osteoarthritis, right knee: Secondary | ICD-10-CM | POA: Diagnosis not present

## 2021-01-07 DIAGNOSIS — S93401A Sprain of unspecified ligament of right ankle, initial encounter: Secondary | ICD-10-CM | POA: Insufficient documentation

## 2021-01-07 DIAGNOSIS — M179 Osteoarthritis of knee, unspecified: Secondary | ICD-10-CM | POA: Insufficient documentation

## 2021-01-07 DIAGNOSIS — S93491D Sprain of other ligament of right ankle, subsequent encounter: Secondary | ICD-10-CM

## 2021-01-07 NOTE — Assessment & Plan Note (Signed)
4 days s/p acute sprain (twisted when falling) Now clinically improved  Much less swollen  rom is improved  Most of the tenderness is lateral Xray and ER notes reviewed Encouraged her to continue ice/elevation when able Ankle brace prn when active  inst to start using voltaren gel up to four times daily  Continue tylenol Handout with ankle rom exercises / reviewed  When improved can start working with a band inst pt to update in 2 wk (would consider PT if needed)

## 2021-01-07 NOTE — Patient Instructions (Addendum)
I think you are doing well with the ankle sprain   Use ice whenever you can  Elevate it when you can  Continue the brace when up and around   Tylenol is ok as needed   Use the voltaren gel on ankle and knee -this will help along with it  Try the range of motion exercises as tolerated   Update me with how you are doing in about 2 weeks

## 2021-01-07 NOTE — Assessment & Plan Note (Signed)
Degenerative change noted on XR after fall on it  Now much clinical improvement Some lateral tenderness and crepitus Enc ice  Knee brace prn  Tylenol and voltaren gel

## 2021-01-07 NOTE — Progress Notes (Signed)
Subjective:    Patient ID: Crystal Tucker, female    DOB: 1960-11-05, 59 y.o.   MRN: [id number]  This visit occurred during the SARS-CoV-2 public health emergency.  Safety protocols were in place, including screening questions prior to the visit, additional usage of staff PPE, and extensive cleaning of exam room while observing appropriate contact time as indicated for disinfecting solutions.   HPI Pt presents for f/u of ER visit for a fall   Wt Readings from Last 3 Encounters:  01/07/21 230 lb 6 oz (104.5 kg)  01/03/21 225 lb (102.1 kg)  12/23/20 225 lb 2 oz (102.1 kg)   36.08 kg/m   Seen on 10/29 in ER at [place] on leaves and fell onto R knee Presented with R ankle and knee pain  No head injury  Pain was bad initially   Has done fairly well   R ankle swells during the day  Can bear weight on it  Sore in general  Has to find good position at night  Elevates and ice  Has a brace for stability   Knee is sore but better [place] laterally   Takes tylenol arthritis every am   Xrays: DG Ankle Complete Right  Result Date: 01/03/2021 CLINICAL DATA:  Status post fall. EXAM: RIGHT ANKLE - COMPLETE 3+ VIEW COMPARISON:  None. FINDINGS: There is no evidence of fracture, dislocation, or joint effusion. There is no evidence of arthropathy or other focal bone abnormality. There is mild diffuse soft tissue swelling. IMPRESSION: 1. No acute osseous abnormality. 2. Mild diffuse soft tissue swelling. Electronically Signed   By: Virgina Norfolk M.D.   On: 01/03/2021 21:30   DG Knee Complete 4 Views Right  Result Date: 01/03/2021 CLINICAL DATA:  Status post fall. EXAM: RIGHT KNEE - COMPLETE 4+ VIEW COMPARISON:  None. FINDINGS: No evidence of an acute fracture or dislocation. Medial and lateral marginal osteophytes are present. Moderate severity medial and tibiofemoral compartment space narrowing is seen. A small joint effusion is noted. IMPRESSION: 1. No acute fracture or  dislocation. 2. Moderate degenerative changes with a small joint effusion. Electronically Signed   By: Virgina Norfolk M.D.   On: 01/03/2021 21:29   DG Foot Complete Right  Result Date: 01/03/2021 CLINICAL DATA:  Status post fall. EXAM: RIGHT FOOT COMPLETE - 3+ VIEW COMPARISON:  None. FINDINGS: There is no evidence of fracture or dislocation. There is no evidence of arthropathy or other focal bone abnormality. Soft tissues are unremarkable. IMPRESSION: Negative. Electronically Signed   By: Virgina Norfolk M.D.   On: 01/03/2021 21:30    She was d/c home with a knee immobilizer and R ankle brace /crutch Px percocet for severe pain  She was scared to take it-made her feel funny Still occ takes 1/2 pill at night if pain is severe   No nsaid due to stomach  Patient Active Problem List   Diagnosis Date Noted   OA (osteoarthritis) of knee 01/07/2021   Right ankle sprain 01/07/2021   Concussion 10/28/2020   Diarrhea 10/28/2020   Vitamin B12 deficiency 07/29/2020   Vitamin D deficiency 07/29/2020   Colon cancer screening 07/29/2020   Current use of proton pump inhibitor 07/16/2020   Back pain 04/10/2020   Chronic pain of left thumb 04/10/2020   Flatus 10/31/2019   Dizziness 09/24/2019   History of heart surgery 09/24/2019   Hemorrhoids 12/11/2018   Hyperlipidemia, mild 11/04/2016   Encounter for screening for HIV 11/03/2016   Encounter for hepatitis  C screening test for low risk patient 11/03/2016   Encounter for screening mammogram for breast cancer 11/03/2015   Prediabetes 11/03/2015   Herpes zoster 10/28/2015   Obesity 11/10/2011   Encounter for general adult medical examination with abnormal findings 11/01/2011   Allergic rhinitis 07/16/2011   OSA (obstructive sleep apnea) 07/16/2011   DEPENDENT EDEMA, LEGS, BILATERAL 10/11/2008   Essential hypertension 10/26/2006   GERD 10/26/2006   Diverticulosis of large intestine 10/26/2006   COLONIC POLYPS, HX OF 10/26/2006   Past  Medical History:  Diagnosis Date   Allergy    Anemia    NOS   Arthritis    Cardiac valve prolapse    leaking valve with surgery 1998   Contact with or exposure to venereal diseases    Diverticulosis of colon (without mention of hemorrhage)    Edema    GERD (gastroesophageal reflux disease)    H/O: pneumonia    Hypertension    Pneumonia    Pruritus of genital organs    Rash and other nonspecific skin eruption    Sleep apnea    Tubular adenoma of colon    Unspecified symptom associated with female genital organs    Past Surgical History:  Procedure Laterality Date   [place]   COLONOSCOPY  7/03   polyps and tics   DIAGNOSTIC LAPAROSCOPY     KNEE ARTHROSCOPY     right   KNEE SURGERY  10/04   LAPAROSCOPY N/A 11/12/2014   Procedure: LAPAROSCOPY DIAGNOSTIC;  Surgeon: Molli Posey, MD;  Location: [facility];  Service: Gynecology;  Laterality: N/A;   PARTIAL HYSTERECTOMY     POLYPECTOMY     RECTAL POLYPECTOMY  8/07   ROTATOR CUFF REPAIR Right    2   SALPINGOOPHORECTOMY Right 11/12/2014   Procedure: SALPINGO OOPHORECTOMY;  Surgeon: Molli Posey, MD;  Location: [facility];  Service: Gynecology;  Laterality: Right;   WRIST SURGERY Right    x 2   Social History   Tobacco Use   Smoking status: Former    Packs/day: 0.80    Years: 30.00    Pack years: 24.00    Types: Cigarettes    Quit date: 03/01/2002    Years since quitting: 18.8   Smokeless tobacco: Never  Vaping Use   Vaping Use: Never used  Substance Use Topics   Alcohol use: Not Currently   Drug use: No   Family History  Problem Relation Age of Onset   Colon polyps Mother    Diabetes Mother    Hypertension Mother    Diabetes Maternal Aunt    Heart disease Maternal Aunt    Heart disease Maternal Uncle    Cancer Paternal Aunt        type unknown   Cerebral aneurysm Paternal Aunt    Cancer Paternal Aunt        x 2 , types unknown   Heart disease Maternal Grandmother    Cerebral aneurysm Cousin     Colon cancer Neg Hx    Esophageal cancer Neg Hx    Rectal cancer Neg Hx    Stomach cancer Neg Hx    Pancreatic cancer Neg Hx    Allergies  Allergen Reactions   Lisinopril     cough   Other Rash    All citrus- lemons, oranges, grapefruit, watermelon, cantelope - all fruits with juices    Current Outpatient Medications on File Prior to Visit  Medication Sig Dispense Refill   Acetaminophen (TYLENOL  ARTHRITIS EXT RELIEF PO) Take 2 tablets by mouth daily.     albuterol (PROAIR HFA) 108 (90 Base) MCG/ACT inhaler Inhale 2 puffs into the lungs every 6 (six) hours as needed for wheezing or shortness of breath. 18 g 5   AMBULATORY NON FORMULARY MEDICATION Medication Name: Nitroglycerin ointment 0.125% use pea sized amount three times a day per rectum for 6-8 weeks 30 g 1   amLODipine (NORVASC) 5 MG tablet TAKE 1 TABLET BY MOUTH ONCE DAILY 90 tablet 3   ergocalciferol (VITAMIN D2) 1.25 MG (50000 UT) capsule Take 1 capsule (50,000 Units total) by mouth once a week. 12 capsule 0   hydrocortisone (ANUSOL-HC) 25 MG suppository Place 1 suppository (25 mg total) rectally at bedtime. 12 suppository 3   methocarbamol (ROBAXIN) 500 MG tablet TAKE 1 TABLET BY MOUTH EVERY 8 HOURS AS NEEDED FOR MUSCLE SPASM (BACK  PAIN)  (WATCH  OUT  FOR  SEDATION) 30 tablet 2   mometasone (NASONEX) 50 MCG/ACT nasal spray Place 2 sprays into the nose daily. 1 each 12   omeprazole (PRILOSEC) 20 MG capsule Take 1 capsule (20 mg total) by mouth daily. 90 capsule 3   oxyCODONE-acetaminophen (PERCOCET/ROXICET) 5-325 MG tablet Take 1 tablet by mouth every 6 (six) hours as needed for severe pain. 15 tablet 0   PEG-KCl-NaCl-NaSulf-Na Asc-C (PLENVU) 140 g SOLR 1 prep kit for colonoscopy prep 1 each 0   potassium chloride SA (KLOR-CON) 20 MEQ tablet Take 1 tablet (20 mEq total) by mouth daily. 90 tablet 3   No current facility-administered medications on file prior to visit.     Review of Systems  Constitutional:  Negative for  activity change, appetite change, fatigue, fever and unexpected weight change.  HENT:  Negative for congestion, ear pain, rhinorrhea, sinus pressure and sore throat.   Eyes:  Negative for pain, redness and visual disturbance.  Respiratory:  Negative for cough, shortness of breath and wheezing.   Cardiovascular:  Negative for chest pain and palpitations.  Gastrointestinal:  Negative for abdominal pain, blood in stool, constipation and diarrhea.  Endocrine: Negative for polydipsia and polyuria.  Genitourinary:  Negative for dysuria, frequency and urgency.  Musculoskeletal:  Positive for arthralgias and joint swelling. Negative for back pain and myalgias.       Right knee and ankle pain and swelling   Skin:  Negative for pallor and rash.  Allergic/Immunologic: Negative for environmental allergies.  Neurological:  Negative for dizziness, syncope and headaches.  Hematological:  Negative for adenopathy. Does not bruise/bleed easily.  Psychiatric/Behavioral:  Negative for decreased concentration and dysphoric mood. The patient is not nervous/anxious.       Objective:   Physical Exam Constitutional:      General: She is not in acute distress.    Appearance: Normal appearance. She is obese. She is not ill-appearing.  HENT:     Head: Normocephalic and atraumatic.  Cardiovascular:     Rate and Rhythm: Normal rate and regular rhythm.     Pulses: Normal pulses.  Pulmonary:     Effort: Pulmonary effort is normal.     Breath sounds: Normal breath sounds.  Musculoskeletal:     Cervical back: Normal range of motion. No tenderness.     Right knee: Swelling and crepitus present. No deformity, effusion, erythema, ecchymosis or bony tenderness. Normal range of motion. Tenderness present over the lateral joint line. No patellar tendon tenderness. Normal alignment, normal meniscus and normal patellar mobility. Normal pulse.     Instability Tests:  Anterior drawer test negative.     Right ankle: Swelling  present. No ecchymosis. Tenderness present over the lateral malleolus, ATF ligament and posterior TF ligament. No base of 5th metatarsal or proximal fibula tenderness. Decreased range of motion. Anterior drawer test negative. Normal pulse.     Right Achilles Tendon: Normal.     Right foot: Normal range of motion and normal capillary refill. No swelling, deformity, tenderness or crepitus. Normal pulse.     Comments: Mild R ankle swelling, worse laterally  Most tenderness is anterior to lateral malleolus   Pain to plantar flex Pain with dorsi flex (less) Pain with inversion /neg talar tilt   Neurological:     General: No focal deficit present.     Mental Status: She is alert.     Sensory: No sensory deficit.     Motor: No weakness.  Psychiatric:        Mood and Affect: Mood normal.          Assessment & Plan:   Problem List Items Addressed This Visit       Musculoskeletal and Integument   OA (osteoarthritis) of knee    Degenerative change noted on XR after fall on it  Now much clinical improvement Some lateral tenderness and crepitus Enc ice  Knee brace prn  Tylenol and voltaren gel      Right ankle sprain - Primary    4 days s/p acute sprain (twisted when falling) Now clinically improved  Much less swollen  rom is improved  Most of the tenderness is lateral Xray and ER notes reviewed Encouraged her to continue ice/elevation when able Ankle brace prn when active  inst to start using voltaren gel up to four times daily  Continue tylenol Handout with ankle rom exercises / reviewed  When improved can start working with a band inst pt to update in 2 wk (would consider PT if needed)

## 2021-01-15 ENCOUNTER — Encounter: Payer: Self-pay | Admitting: Gastroenterology

## 2021-01-22 ENCOUNTER — Ambulatory Visit (AMBULATORY_SURGERY_CENTER): Payer: BC Managed Care – PPO | Admitting: Gastroenterology

## 2021-01-22 ENCOUNTER — Encounter: Payer: Self-pay | Admitting: Gastroenterology

## 2021-01-22 VITALS — BP 140/75 | HR 68 | Temp 96.2°F | Resp 12 | Ht 67.0 in | Wt 230.0 lb

## 2021-01-22 DIAGNOSIS — Z1211 Encounter for screening for malignant neoplasm of colon: Secondary | ICD-10-CM | POA: Diagnosis not present

## 2021-01-22 DIAGNOSIS — D122 Benign neoplasm of ascending colon: Secondary | ICD-10-CM

## 2021-01-22 DIAGNOSIS — Z86010 Personal history of colonic polyps: Secondary | ICD-10-CM | POA: Diagnosis not present

## 2021-01-22 MED ORDER — SODIUM CHLORIDE 0.9 % IV SOLN: 500.0000 mL | Freq: Once | INTRAVENOUS | Status: DC

## 2021-01-22 NOTE — Op Note (Signed)
[place] Patient Name: Crystal Tucker Procedure Date: 01/22/2021 10:28 AM MRN: [id number] Endoscopist: Mauri Pole , MD Age: 60 Referring MD:  Date of Birth: 09-07-1960 Gender: Female Account #: [id number] Procedure:                Colonoscopy Indications:              High risk colon cancer surveillance: Personal                            history of colonic polyps, High risk colon cancer                            surveillance: Personal history of adenoma less than                            10 mm in size Medicines:                Monitored Anesthesia Care Procedure:                Pre-Anesthesia Assessment:                           - Prior to the procedure, a History and Physical                            was performed, and patient medications and                            allergies were reviewed. The patient's tolerance of                            previous anesthesia was also reviewed. The risks                            and benefits of the procedure and the sedation                            options and risks were discussed with the patient.                            All questions were answered, and informed consent                            was obtained. Prior Anticoagulants: The patient has                            taken no previous anticoagulant or antiplatelet                            agents. ASA Grade Assessment: II - A patient with                            mild systemic disease. After reviewing the risks  and benefits, the patient was deemed in                            satisfactory condition to undergo the procedure.                           After obtaining informed consent, the colonoscope                            was passed under direct vision. Throughout the                            procedure, the patient's blood pressure, pulse, and                            oxygen saturations were monitored  continuously. The                            PCF-HQ190L Colonoscope was introduced through the                            anus and advanced to the the cecum, identified by                            appendiceal orifice and ileocecal valve. The                            colonoscopy was performed without difficulty. The                            patient tolerated the procedure well. The quality                            of the bowel preparation was adequate. The                            ileocecal valve, appendiceal orifice, and rectum                            were photographed. Scope In: 10:41:01 AM Scope Out: 10:54:36 AM Scope Withdrawal Time: 0 hours 8 minutes 25 seconds  Total Procedure Duration: 0 hours 13 minutes 35 seconds  Findings:                 The perianal and digital rectal examinations were                            normal.                           A 12 mm polyp was found in the ascending colon. The                            polyp was semi-pedunculated. The polyp was removed  with a hot snare. Resection and retrieval were                            complete.                           Multiple small and large-mouthed diverticula were                            found in the sigmoid colon, descending colon,                            transverse colon and ascending colon. There was no                            evidence of diverticular bleeding.                           Non-bleeding external and internal hemorrhoids were                            found during retroflexion. The hemorrhoids were                            small. Complications:            No immediate complications. Estimated Blood Loss:     Estimated blood loss was minimal. Impression:               - One 12 mm polyp in the ascending colon, removed                            with a hot snare. Resected and retrieved.                           - Moderate diverticulosis in the  sigmoid colon, in                            the descending colon, in the transverse colon and                            in the ascending colon. There was no evidence of                            diverticular bleeding.                           - Non-bleeding external and internal hemorrhoids. Recommendation:           - Patient has a contact number available for                            emergencies. The signs and symptoms of potential                            delayed complications were discussed with the  patient. Return to normal activities tomorrow.                            Written discharge instructions were provided to the                            patient.                           - Resume previous diet.                           - Continue present medications.                           - Await pathology results.                           - Repeat colonoscopy in 3 years for surveillance                            based on pathology results. Mauri Pole, MD 01/22/2021 11:08:48 AM This report has been signed electronically.

## 2021-01-22 NOTE — Progress Notes (Signed)
[facility] History and Physical   Primary Care Physician:  Tower, Wynelle Fanny, MD   Reason for Procedure:  History of adenomatous colon polyps  Plan:    Surveillance colonoscopy with possible interventions as needed     HPI: Crystal Tucker is a very pleasant 60 y.o. femalehere for surveillance colonoscopy. Denies any nausea, vomiting, abdominal pain, melena or bright red blood per rectum  She had rectal bleeding associated with anal fissure, has since resolved  The risks and benefits as well as alternatives of endoscopic procedure(s) have been discussed and reviewed. All questions answered. The patient agrees to proceed.    Past Medical History:  Diagnosis Date   Allergy    Anemia    NOS   Arthritis    Cardiac valve prolapse    leaking valve with surgery 1998   Contact with or exposure to venereal diseases    Diverticulosis of colon (without mention of hemorrhage)    Edema    GERD (gastroesophageal reflux disease)    H/O: pneumonia    Hypertension    Pneumonia    Pruritus of genital organs    Rash and other nonspecific skin eruption    Sleep apnea    Tubular adenoma of colon    Unspecified symptom associated with female genital organs     Past Surgical History:  Procedure Laterality Date   CARDIAC VALVE SURGERY  1998   COLONOSCOPY  7/03   polyps and tics   DIAGNOSTIC LAPAROSCOPY     KNEE ARTHROSCOPY     right   KNEE SURGERY  10/04   LAPAROSCOPY N/A 11/12/2014   Procedure: LAPAROSCOPY DIAGNOSTIC;  Surgeon: Molli Posey, MD;  Location: [facility];  Service: Gynecology;  Laterality: N/A;   PARTIAL HYSTERECTOMY     POLYPECTOMY     RECTAL POLYPECTOMY  8/07   ROTATOR CUFF REPAIR Right    2   SALPINGOOPHORECTOMY Right 11/12/2014   Procedure: SALPINGO OOPHORECTOMY;  Surgeon: Molli Posey, MD;  Location: [facility];  Service: Gynecology;  Laterality: Right;   WRIST SURGERY Right    x 2    Prior to Admission medications   Medication Sig Start Date End  Date Taking? Authorizing Provider  Acetaminophen (TYLENOL ARTHRITIS EXT RELIEF PO) Take 2 tablets by mouth daily.   Yes [provider]  albuterol (PROAIR HFA) 108 (90 Base) MCG/ACT inhaler Inhale 2 puffs into the lungs every 6 (six) hours as needed for wheezing or shortness of breath. 10/20/16  Yes Tower, Wynelle Fanny, MD  AMBULATORY NON FORMULARY MEDICATION Medication Name: Nitroglycerin ointment 0.125% use pea sized amount three times a day per rectum for 6-8 weeks 12/23/20  Yes Zula Hovsepian V, MD  amLODipine (NORVASC) 5 MG tablet TAKE 1 TABLET BY MOUTH ONCE DAILY 07/29/20  Yes Tower, Wynelle Fanny, MD  hydrocortisone (ANUSOL-HC) 25 MG suppository Place 1 suppository (25 mg total) rectally at bedtime. 10/31/19  Yes Tower, Marne A, MD  mometasone (NASONEX) 50 MCG/ACT nasal spray Place 2 sprays into the nose daily. 10/31/19  Yes Tower, Wynelle Fanny, MD  omeprazole (PRILOSEC) 20 MG capsule Take 1 capsule (20 mg total) by mouth daily. 07/29/20  Yes Tower, Wynelle Fanny, MD  oxyCODONE-acetaminophen (PERCOCET/ROXICET) 5-325 MG tablet Take 1 tablet by mouth every 6 (six) hours as needed for severe pain. 01/03/21  Yes Hong, Greggory Brandy, MD  potassium chloride SA (KLOR-CON) 20 MEQ tablet Take 1 tablet (20 mEq total) by mouth daily. 07/29/20  Yes Tower, Wynelle Fanny, MD  ergocalciferol (  VITAMIN D2) 1.25 MG (50000 UT) capsule Take 1 capsule (50,000 Units total) by mouth once a week. 10/30/20   Tower, Wynelle Fanny, MD  methocarbamol (ROBAXIN) 500 MG tablet TAKE 1 TABLET BY MOUTH EVERY 8 HOURS AS NEEDED FOR MUSCLE SPASM (BACK  PAIN)  (WATCH  OUT  FOR  SEDATION) Patient not taking: Reported on 01/22/2021 11/16/20   Tower, Wynelle Fanny, MD    Current Outpatient Medications  Medication Sig Dispense Refill   Acetaminophen (TYLENOL ARTHRITIS EXT RELIEF PO) Take 2 tablets by mouth daily.     albuterol (PROAIR HFA) 108 (90 Base) MCG/ACT inhaler Inhale 2 puffs into the lungs every 6 (six) hours as needed for wheezing or shortness of breath. 18 g 5    AMBULATORY NON FORMULARY MEDICATION Medication Name: Nitroglycerin ointment 0.125% use pea sized amount three times a day per rectum for 6-8 weeks 30 g 1   amLODipine (NORVASC) 5 MG tablet TAKE 1 TABLET BY MOUTH ONCE DAILY 90 tablet 3   hydrocortisone (ANUSOL-HC) 25 MG suppository Place 1 suppository (25 mg total) rectally at bedtime. 12 suppository 3   mometasone (NASONEX) 50 MCG/ACT nasal spray Place 2 sprays into the nose daily. 1 each 12   omeprazole (PRILOSEC) 20 MG capsule Take 1 capsule (20 mg total) by mouth daily. 90 capsule 3   oxyCODONE-acetaminophen (PERCOCET/ROXICET) 5-325 MG tablet Take 1 tablet by mouth every 6 (six) hours as needed for severe pain. 15 tablet 0   potassium chloride SA (KLOR-CON) 20 MEQ tablet Take 1 tablet (20 mEq total) by mouth daily. 90 tablet 3   ergocalciferol (VITAMIN D2) 1.25 MG (50000 UT) capsule Take 1 capsule (50,000 Units total) by mouth once a week. 12 capsule 0   methocarbamol (ROBAXIN) 500 MG tablet TAKE 1 TABLET BY MOUTH EVERY 8 HOURS AS NEEDED FOR MUSCLE SPASM (BACK  PAIN)  (WATCH  OUT  FOR  SEDATION) (Patient not taking: Reported on 01/22/2021) 30 tablet 2   No current facility-administered medications for this visit.    Allergies as of 01/22/2021 - Review Complete 01/22/2021  Allergen Reaction Noted   Lisinopril  06/24/2011   Other Rash 07/11/2015    Family History  Problem Relation Age of Onset   Colon polyps Mother    Diabetes Mother    Hypertension Mother    Diabetes Maternal Aunt    Heart disease Maternal Aunt    Heart disease Maternal Uncle    Cancer Paternal Aunt        type unknown   Cerebral aneurysm Paternal Aunt    Cancer Paternal Aunt        x 2 , types unknown   Heart disease Maternal Grandmother    Cerebral aneurysm Cousin    Colon cancer Neg Hx    Esophageal cancer Neg Hx    Rectal cancer Neg Hx    Stomach cancer Neg Hx    Pancreatic cancer Neg Hx     Social History   Socioeconomic History   Marital status:  Married    Spouse name: Not on file   Number of children: 1   Years of education: Not on file   Highest education level: Not on file  Occupational History   Occupation: caregiver  Tobacco Use   Smoking status: Former    Packs/day: 0.80    Years: 30.00    Pack years: 24.00    Types: Cigarettes    Quit date: 03/01/2002    Years since quitting: 18.9   Smokeless  tobacco: Never  Vaping Use   Vaping Use: Never used  Substance and Sexual Activity   Alcohol use: Not Currently   Drug use: No   Sexual activity: Not on file  Other Topics Concern   Not on file  Social History Narrative   Married      1 child      [employer]      Works part-time home health      Caffeine: 1 daily   Social Determinants of Radio broadcast assistant Strain: Not on file  Food Insecurity: Not on file  Transportation Needs: Not on file  Physical Activity: Not on file  Stress: Not on file  Social Connections: Not on file  Intimate Partner Violence: Not on file    Review of Systems:  All other review of systems negative except as mentioned in the HPI.  Physical Exam: Vital signs in last 24 hours: BP 120/78   Pulse 75   Temp (!) 96.2 F (35.7 C) (Skin)   Resp 15   Ht 5\' 7"  (1.702 m)   Wt 230 lb (104.3 kg)   SpO2 95%   BMI 36.02 kg/m     General:   Alert, NAD Lungs:  Clear .   Heart:  Regular rate and rhythm Abdomen:  Soft, nontender and nondistended. Neuro/Psych:  Alert and cooperative. Normal mood and affect. A and O x 3  Reviewed labs, radiology imaging, old records and pertinent past GI work up  Patient is appropriate for planned procedure(s) and anesthesia in an ambulatory setting   K. Denzil Magnuson , MD [phone]

## 2021-01-22 NOTE — Progress Notes (Signed)
PT taken to PACU. Monitors in place. VSS. Report given to RN. 

## 2021-01-22 NOTE — Patient Instructions (Signed)
Handout on polyps and diverticulosis given    YOU HAD AN ENDOSCOPIC PROCEDURE TODAY AT THE [facility]:   Refer to the procedure report that was given to you for any specific questions about what was found during the examination.  If the procedure report does not answer your questions, please call your gastroenterologist to clarify.  If you requested that your care partner not be given the details of your procedure findings, then the procedure report has been included in a sealed envelope for you to review at your convenience later.  YOU SHOULD EXPECT: Some feelings of bloating in the abdomen. Passage of more gas than usual.  Walking can help get rid of the air that was put into your GI tract during the procedure and reduce the bloating. If you had a lower endoscopy (such as a colonoscopy or flexible sigmoidoscopy) you may notice spotting of blood in your stool or on the toilet paper. If you underwent a bowel prep for your procedure, you may not have a normal bowel movement for a few days.  Please Note:  You might notice some irritation and congestion in your nose or some drainage.  This is from the oxygen used during your procedure.  There is no need for concern and it should clear up in a day or so.  SYMPTOMS TO REPORT IMMEDIATELY:   Following lower endoscopy (colonoscopy or flexible sigmoidoscopy):  Excessive amounts of blood in the stool  Significant tenderness or worsening of abdominal pains  Swelling of the abdomen that is new, acute  Fever of 100F or higher   For urgent or emergent issues, a gastroenterologist can be reached at any hour by calling [phone]. Do not use MyChart messaging for urgent concerns.    DIET:  We do recommend a small meal at first, but then you may proceed to your regular diet.  Drink plenty of fluids but you should avoid alcoholic beverages for 24 hours.  ACTIVITY:  You should plan to take it easy for the rest of today and you should NOT  DRIVE or use heavy machinery until tomorrow (because of the sedation medicines used during the test).    FOLLOW UP: Our staff will call the number listed on your records 48-72 hours following your procedure to check on you and address any questions or concerns that you may have regarding the information given to you following your procedure. If we do not reach you, we will leave a message.  We will attempt to reach you two times.  During this call, we will ask if you have developed any symptoms of COVID 19. If you develop any symptoms (ie: fever, flu-like symptoms, shortness of breath, cough etc.) before then, please call [phone].  If you test positive for Covid 19 in the 2 weeks post procedure, please call and report this information to us.    If any biopsies were taken you will be contacted by phone or by letter within the next 1-3 weeks.  Please call us at [phone] if you have not heard about the biopsies in 3 weeks.    SIGNATURES/CONFIDENTIALITY: You and/or your care partner have signed paperwork which will be entered into your electronic medical record.  These signatures attest to the fact that that the information above on your After Visit Summary has been reviewed and is understood.  Full responsibility of the confidentiality of this discharge information lies with you and/or your care-partner. 

## 2021-01-22 NOTE — Progress Notes (Signed)
Pt's states no medical or surgical changes since previsit or office visit. VS assessed by C.W 

## 2021-01-26 ENCOUNTER — Telehealth: Payer: Self-pay | Admitting: *Deleted

## 2021-01-26 NOTE — Telephone Encounter (Signed)
  Follow up Call-  Call back number 01/22/2021  Post procedure Call Back phone  # [phone]  Permission to leave phone message Yes  Some recent data might be hidden     Patient questions:  Do you have a fever, pain , or abdominal swelling? No. Pain Score  0 *  Have you tolerated food without any problems? Yes.    Have you been able to return to your normal activities? Yes.    Do you have any questions about your discharge instructions: Diet   No. Medications  No. Follow up visit  No.  Do you have questions or concerns about your Care? No.  Actions: * If pain score is 4 or above: No action needed, pain <4.  Have you developed a fever since your procedure? no  2.   Have you had an respiratory symptoms (SOB or cough) since your procedure? no  3.   Have you tested positive for COVID 19 since your procedure no  4.   Have you had any family members/close contacts diagnosed with the COVID 19 since your procedure?  no   If yes to any of these questions please route to Joylene John, RN and Joella Prince, RN

## 2021-02-04 ENCOUNTER — Encounter: Payer: BC Managed Care – PPO | Admitting: Gastroenterology

## 2021-02-05 ENCOUNTER — Ambulatory Visit: Payer: BC Managed Care – PPO | Admitting: Family Medicine

## 2021-02-06 ENCOUNTER — Encounter: Payer: Self-pay | Admitting: Gastroenterology

## 2021-02-09 ENCOUNTER — Ambulatory Visit: Payer: BC Managed Care – PPO | Admitting: Family Medicine

## 2021-02-24 ENCOUNTER — Other Ambulatory Visit: Payer: Self-pay

## 2021-02-24 ENCOUNTER — Encounter: Payer: Self-pay | Admitting: Family Medicine

## 2021-02-24 ENCOUNTER — Ambulatory Visit: Payer: BC Managed Care – PPO | Admitting: Family Medicine

## 2021-02-24 VITALS — BP 150/88 | HR 75 | Temp 97.3°F | Ht 67.0 in | Wt 228.4 lb

## 2021-02-24 DIAGNOSIS — I10 Essential (primary) hypertension: Secondary | ICD-10-CM | POA: Diagnosis not present

## 2021-02-24 DIAGNOSIS — M1711 Unilateral primary osteoarthritis, right knee: Secondary | ICD-10-CM | POA: Diagnosis not present

## 2021-02-24 DIAGNOSIS — R7303 Prediabetes: Secondary | ICD-10-CM

## 2021-02-24 LAB — POCT GLYCOSYLATED HEMOGLOBIN (HGB A1C): Hemoglobin A1C: 6.3 % — AB (ref 4.0–5.6)

## 2021-02-24 MED ORDER — AMLODIPINE BESYLATE 10 MG PO TABS: 10.0000 mg | ORAL_TABLET | Freq: Every day | ORAL | 3 refills | Status: DC

## 2021-02-24 NOTE — Assessment & Plan Note (Signed)
Improved with better diet and exercise  Lab Results  Component Value Date   HGBA1C 6.3 (A) 02/24/2021   Commended  Plan to continue low glycemic diet and exercise as tolerated  She limits bread and processed foods/more green veggies lately

## 2021-02-24 NOTE — Assessment & Plan Note (Signed)
bp is elevated today and she c/o headache on/off for a few weeks  BP: (!) 150/88    Will inc her amlodipine to 10 mg daily  inst to hold and call if side effects Hesitant to do diuretic in light of baseline low K  Intol of ace  Encouraged fluids and also keep watching sodium intake  F/u 2 mo

## 2021-02-24 NOTE — Patient Instructions (Addendum)
I want to go up your amlodipine from 5 to 10 mg daily  Take 2 pills daily of the 5 mg until you run out   Follow up in about 2 months   Keep up the good work with diet and exercise  A1c is better  Try to get most of your carbohydrates from produce (with the exception of white potatoes)  Eat less bread/pasta/rice/snack foods/cereals/sweets and other items from the middle of the grocery store (processed carbs)

## 2021-02-24 NOTE — Progress Notes (Signed)
Subjective:    Patient ID: Crystal Tucker, female    DOB: Jul 30, 1960, 60 y.o.   MRN: [id number]  This visit occurred during the SARS-CoV-2 public health emergency.  Safety protocols were in place, including screening questions prior to the visit, additional usage of staff PPE, and extensive cleaning of exam room while observing appropriate contact time as indicated for disinfecting solutions.   HPI Pt presents for f/u of prediabetes and chronic health problems   Wt Readings from Last 3 Encounters:  02/24/21 228 lb 6 oz (103.6 kg)  01/22/21 230 lb (104.3 kg)  01/07/21 230 lb 6 oz (104.5 kg)   35.77 kg/m   Has been feeling pretty good  Working a lot, has to work the holidays   Had some cramps in her R leg this weekend  That was the leg she injured (and has had 3 surgeries on that knee)  She took some mustard  Has been drinking a lot of water  Takes her K   Needs a handicapped sticker for her R knee problem Bad ACL tear  Hard to get through a parking lot  She needs a knee replacement in the future     Lab Results  Component Value Date   CREATININE 0.77 10/28/2020   BUN 14 10/28/2020   NA 140 10/28/2020   K 4.0 10/28/2020   CL 102 10/28/2020   CO2 29 10/28/2020   She has diarrhea on/off with food intolerances She ate fried chicken and she reacted to that (very seldom eats fried foods)     HTN bp is stable today  No cp or palpitations or headaches or edema  No side effects to medicines   BP Readings from Last 3 Encounters:  02/24/21 (!) 150/88  01/22/21 140/75  01/07/21 (!) 142/90    Amlodipine 5 mg daily , took it today   Had her flu shot  Bad reaction to last covid shot-ended up in the hospital   Prediabetes (previously diabetic) A1c was up to 6.8 (in DM range) in august  Wanted to get this down with diet   Today -improved Lab Results  Component Value Date   HGBA1C 6.3 (A) 02/24/2021   She has been eating better  No fried food and reducing  processed food  Stopped bread  She had cut way back on pasta  Eating a lot of salads and greens  Changed her habits overall   Still walks when her knee lets her (a block)  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  It is not easy for her , some days she cannot do it    Lab Results  Component Value Date   CHOL 197 07/16/2020   HDL 58.10 07/16/2020   [place] 123 (H) 07/16/2020   TRIG 76.0 07/16/2020   CHOLHDL 3 07/16/2020   Patient Active Problem List   Diagnosis Date Noted   OA (osteoarthritis) of knee 01/07/2021   Vitamin B12 deficiency 07/29/2020   Vitamin D deficiency 07/29/2020   Colon cancer screening 07/29/2020   Current use of proton pump inhibitor 07/16/2020   Back pain 04/10/2020   Chronic pain of left thumb 04/10/2020   Flatus 10/31/2019   History of heart surgery 09/24/2019   Hemorrhoids 12/11/2018   Hyperlipidemia, mild 11/04/2016   Encounter for screening for HIV 11/03/2016   Encounter for hepatitis C screening test for low risk patient 11/03/2016  Encounter for screening mammogram for breast cancer 11/03/2015   Prediabetes 11/03/2015   Herpes zoster 10/28/2015   Obesity 11/10/2011   Encounter for general adult medical examination with abnormal findings 11/01/2011   Allergic rhinitis 07/16/2011   OSA (obstructive sleep apnea) 07/16/2011   DEPENDENT EDEMA, LEGS, BILATERAL 10/11/2008   Essential hypertension 10/26/2006   GERD 10/26/2006   Diverticulosis of large intestine 10/26/2006   COLONIC POLYPS, HX OF 10/26/2006   Past Medical History:  Diagnosis Date   Allergy    Anemia    NOS   Arthritis    Cardiac valve prolapse    leaking valve with surgery 1998   Contact with or exposure to venereal diseases    Diverticulosis of colon (without mention of hemorrhage)    Edema    GERD (gastroesophageal reflux disease)    H/O: pneumonia    Hypertension    Pneumonia    Pruritus of genital organs     Rash and other nonspecific skin eruption    Sleep apnea    Tubular adenoma of colon    Unspecified symptom associated with female genital organs    Past Surgical History:  Procedure Laterality Date   [place]   COLONOSCOPY  7/03   polyps and tics   DIAGNOSTIC LAPAROSCOPY     KNEE ARTHROSCOPY     right   KNEE SURGERY  10/04   LAPAROSCOPY N/A 11/12/2014   Procedure: LAPAROSCOPY DIAGNOSTIC;  Surgeon: Molli Posey, MD;  Location: [facility];  Service: Gynecology;  Laterality: N/A;   PARTIAL HYSTERECTOMY     POLYPECTOMY     RECTAL POLYPECTOMY  8/07   ROTATOR CUFF REPAIR Right    2   SALPINGOOPHORECTOMY Right 11/12/2014   Procedure: SALPINGO OOPHORECTOMY;  Surgeon: Molli Posey, MD;  Location: [facility];  Service: Gynecology;  Laterality: Right;   WRIST SURGERY Right    x 2   Social History   Tobacco Use   Smoking status: Former    Packs/day: 0.80    Years: 30.00    Pack years: 24.00    Types: Cigarettes    Quit date: 03/01/2002    Years since quitting: 19.0   Smokeless tobacco: Never  Vaping Use   Vaping Use: Never used  Substance Use Topics   Alcohol use: Not Currently   Drug use: No   Family History  Problem Relation Age of Onset   Colon polyps Mother    Diabetes Mother    Hypertension Mother    Diabetes Maternal Aunt    Heart disease Maternal Aunt    Heart disease Maternal Uncle    Cancer Paternal Aunt        type unknown   Cerebral aneurysm Paternal Aunt    Cancer Paternal Aunt        x 2 , types unknown   Heart disease Maternal Grandmother    Cerebral aneurysm Cousin    Colon cancer Neg Hx    Esophageal cancer Neg Hx    Rectal cancer Neg Hx    Stomach cancer Neg Hx    Pancreatic cancer Neg Hx    Allergies  Allergen Reactions   Lisinopril     cough   Other Rash    All citrus- lemons, oranges, grapefruit, watermelon, cantelope - all fruits with juices    Current Outpatient Medications on File Prior to Visit  Medication Sig  Dispense Refill   Acetaminophen (TYLENOL ARTHRITIS EXT RELIEF PO) Take 2 tablets by mouth daily.  albuterol (PROAIR HFA) 108 (90 Base) MCG/ACT inhaler Inhale 2 puffs into the lungs every 6 (six) hours as needed for wheezing or shortness of breath. 18 g 5   AMBULATORY NON FORMULARY MEDICATION Medication Name: Nitroglycerin ointment 0.125% use pea sized amount three times a day per rectum for 6-8 weeks 30 g 1   ergocalciferol (VITAMIN D2) 1.25 MG (50000 UT) capsule Take 1 capsule (50,000 Units total) by mouth once a week. 12 capsule 0   hydrocortisone (ANUSOL-HC) 25 MG suppository Place 1 suppository (25 mg total) rectally at bedtime. 12 suppository 3   methocarbamol (ROBAXIN) 500 MG tablet TAKE 1 TABLET BY MOUTH EVERY 8 HOURS AS NEEDED FOR MUSCLE SPASM (BACK  PAIN)  (WATCH  OUT  FOR  SEDATION) 30 tablet 2   mometasone (NASONEX) 50 MCG/ACT nasal spray Place 2 sprays into the nose daily. 1 each 12   omeprazole (PRILOSEC) 20 MG capsule Take 1 capsule (20 mg total) by mouth daily. 90 capsule 3   oxyCODONE-acetaminophen (PERCOCET/ROXICET) 5-325 MG tablet Take 1 tablet by mouth every 6 (six) hours as needed for severe pain. 15 tablet 0   potassium chloride SA (KLOR-CON) 20 MEQ tablet Take 1 tablet (20 mEq total) by mouth daily. 90 tablet 3   No current facility-administered medications on file prior to visit.     Review of Systems  Constitutional:  Negative for activity change, appetite change, fatigue, fever and unexpected weight change.  HENT:  Negative for congestion, ear pain, rhinorrhea, sinus pressure and sore throat.   Eyes:  Negative for pain, redness and visual disturbance.  Respiratory:  Negative for cough, shortness of breath and wheezing.   Cardiovascular:  Negative for chest pain and palpitations.  Gastrointestinal:  Negative for abdominal pain, blood in stool, constipation and diarrhea.  Endocrine: Negative for polydipsia and polyuria.  Genitourinary:  Negative for dysuria, frequency  and urgency.  Musculoskeletal:  Positive for arthralgias. Negative for back pain and myalgias.       Chronic R knee pain   Skin:  Negative for pallor and rash.  Allergic/Immunologic: Negative for environmental allergies.  Neurological:  Negative for dizziness, syncope and headaches.  Hematological:  Negative for adenopathy. Does not bruise/bleed easily.  Psychiatric/Behavioral:  Negative for decreased concentration and dysphoric mood. The patient is not nervous/anxious.       Objective:   Physical Exam Constitutional:      General: She is not in acute distress.    Appearance: Normal appearance. She is well-developed. She is obese. She is not ill-appearing or diaphoretic.  HENT:     Head: Normocephalic and atraumatic.  Eyes:     Conjunctiva/sclera: Conjunctivae normal.     Pupils: Pupils are equal, round, and reactive to light.  Neck:     Thyroid: No thyromegaly.     Vascular: No carotid bruit or JVD.  Cardiovascular:     Rate and Rhythm: Normal rate and regular rhythm.     Heart sounds: Normal heart sounds.    No gallop.  Pulmonary:     Effort: Pulmonary effort is normal. No respiratory distress.     Breath sounds: Normal breath sounds. No wheezing or rales.  Abdominal:     General: Abdomen is flat. There is no distension or abdominal bruit.  Musculoskeletal:     Cervical back: Normal range of motion and neck supple.     Right lower leg: No edema.     Left lower leg: No edema.     Comments: R knee  is swollen with limited rom  She favors L leg with gait  Lymphadenopathy:     Cervical: No cervical adenopathy.  Skin:    General: Skin is warm and dry.     Coloration: Skin is not pale.     Findings: No rash.  Neurological:     Mental Status: She is alert.     Coordination: Coordination normal.     Deep Tendon Reflexes: Reflexes are normal and symmetric.  Psychiatric:        Mood and Affect: Mood normal.        Cognition and Memory: Cognition normal.           Assessment & Plan:   Problem List Items Addressed This Visit       Cardiovascular and Mediastinum   Essential hypertension    bp is elevated today and she c/o headache on/off for a few weeks  BP: (!) 150/88    Will inc her amlodipine to 10 mg daily  inst to hold and call if side effects Hesitant to do diuretic in light of baseline low K  Intol of ace  Encouraged fluids and also keep watching sodium intake  F/u 2 mo       Relevant Medications   amLODipine (NORVASC) 10 MG tablet     Musculoskeletal and Integument   OA (osteoarthritis) of knee    Not yet ready to schedule knee replacement  She has some bad days and cannot walk well in parking lot  Handicapped form done (permanent)          Other   Prediabetes - Primary    Improved with better diet and exercise  Lab Results  Component Value Date   HGBA1C 6.3 (A) 02/24/2021   Commended  Plan to continue low glycemic diet and exercise as tolerated  She limits bread and processed foods/more green veggies lately      Relevant Orders   POCT HgB A1C (Completed)

## 2021-02-24 NOTE — Assessment & Plan Note (Signed)
Not yet ready to schedule knee replacement  She has some bad days and cannot walk well in parking lot  Handicapped form done (permanent)

## 2021-04-27 ENCOUNTER — Ambulatory Visit: Payer: BC Managed Care – PPO | Admitting: Family Medicine

## 2021-04-27 ENCOUNTER — Encounter: Payer: Self-pay | Admitting: Family Medicine

## 2021-04-27 ENCOUNTER — Other Ambulatory Visit: Payer: Self-pay

## 2021-04-27 VITALS — BP 128/78 | HR 57 | Temp 97.3°F | Ht 66.0 in | Wt 228.0 lb

## 2021-04-27 DIAGNOSIS — R7303 Prediabetes: Secondary | ICD-10-CM | POA: Diagnosis not present

## 2021-04-27 DIAGNOSIS — L918 Other hypertrophic disorders of the skin: Secondary | ICD-10-CM | POA: Insufficient documentation

## 2021-04-27 DIAGNOSIS — I10 Essential (primary) hypertension: Secondary | ICD-10-CM | POA: Diagnosis not present

## 2021-04-27 DIAGNOSIS — E559 Vitamin D deficiency, unspecified: Secondary | ICD-10-CM | POA: Diagnosis not present

## 2021-04-27 DIAGNOSIS — R42 Dizziness and giddiness: Secondary | ICD-10-CM

## 2021-04-27 LAB — VITAMIN D 25 HYDROXY (VIT D DEFICIENCY, FRACTURES): VITD: 32.68 ng/mL (ref 30.00–100.00)

## 2021-04-27 NOTE — Patient Instructions (Addendum)
For blood pressure and blood sugar control   Try to get most of your carbohydrates from produce (with the exception of white potatoes)  Eat less bread/pasta/rice/snack foods/cereals/sweets and other items from the middle of the grocery store (processed carbs)  For cholesterol Avoid red meat/ fried foods/ egg yolks/ fatty breakfast meats/ butter, cheese and high fat dairy/ and shellfish    Cut your amlodipine down to 5 mg daily (cut the pill in 1/2)  Give it a week and then let me know how your blood pressure and how your dizziness is   Stop at check out to schedule a visit for skin tag removal and we will check blood pressure then  When dizziness improves, think about adding some exercise

## 2021-04-27 NOTE — Progress Notes (Signed)
Subjective:    Patient ID: Crystal Tucker, female    DOB: 1960-11-09, 61 y.o.   MRN: [id number]  This visit occurred during the SARS-CoV-2 public health emergency.  Safety protocols were in place, including screening questions prior to the visit, additional usage of staff PPE, and extensive cleaning of exam room while observing appropriate contact time as indicated for disinfecting solutions.   HPI Pt presents for prediabetes and HTN  Wt Readings from Last 3 Encounters:  04/27/21 228 lb (103.4 kg)  02/24/21 228 lb 6 oz (103.6 kg)  01/22/21 230 lb (104.3 kg)   36.80 kg/m  Feeling ok overall   No regular exercise  Does not eat as much in general Some processed foods    bp is stable today  No cp or palpitations or headaches or edema  No side effects to medicines  BP Readings from Last 3 Encounters:  04/27/21 128/78  02/24/21 (!) 150/88  01/22/21 140/75      Pulse Readings from Last 3 Encounters:  04/27/21 (!) 57  02/24/21 75  01/22/21 68   Last visit inc amlodipine to 10 mg daily  She felt dizzy when she first started taking it /vertigo wise -now she still has it   BP at home 134/87 yesterday/ around that range general Some 41L diastolic    Lab Results  Component Value Date   CREATININE 0.77 10/28/2020   BUN 14 10/28/2020   NA 140 10/28/2020   K 4.0 10/28/2020   CL 102 10/28/2020   CO2 29 10/28/2020   Taking k 20 meq daily   Prediabetes Lab Results  Component Value Date   HGBA1C 6.3 (A) 02/24/2021    Lab Results  Component Value Date   CHOL 197 07/16/2020   HDL 58.10 07/16/2020   LDLCALC 123 (H) 07/16/2020   TRIG 76.0 07/16/2020   CHOLHDL 3 07/16/2020   Wants to check her vit D  Last visit 32.7 Went with another 12 week course of high dose tx  (finished that now on 2000 iu daily)  Some joint aches and pains   Has a painful skin tag on R neck   Patient Active Problem List   Diagnosis Date Noted   Skin tag 04/27/2021   OA  (osteoarthritis) of knee 01/07/2021   Vitamin B12 deficiency 07/29/2020   Vitamin D deficiency 07/29/2020   Colon cancer screening 07/29/2020   Current use of proton pump inhibitor 07/16/2020   Back pain 04/10/2020   Chronic pain of left thumb 04/10/2020   Flatus 10/31/2019   History of heart surgery 09/24/2019   Hemorrhoids 12/11/2018   Hyperlipidemia, mild 11/04/2016   Encounter for screening for HIV 11/03/2016   Encounter for hepatitis C screening test for low risk patient 11/03/2016   Encounter for screening mammogram for breast cancer 11/03/2015   Prediabetes 11/03/2015   Herpes zoster 10/28/2015   Obesity 11/10/2011   Encounter for general adult medical examination with abnormal findings 11/01/2011   Allergic rhinitis 07/16/2011   OSA (obstructive sleep apnea) 07/16/2011   DEPENDENT EDEMA, LEGS, BILATERAL 10/11/2008   Essential hypertension 10/26/2006   GERD 10/26/2006   Diverticulosis of large intestine 10/26/2006   COLONIC POLYPS, HX OF 10/26/2006   Past Medical History:  Diagnosis Date   Allergy    Anemia    NOS   Arthritis    Cardiac valve prolapse    leaking valve with surgery 1998   Contact with or exposure to venereal diseases    Diverticulosis  of colon (without mention of hemorrhage)    Edema    GERD (gastroesophageal reflux disease)    H/O: pneumonia    Hypertension    Pneumonia    Pruritus of genital organs    Rash and other nonspecific skin eruption    Sleep apnea    Tubular adenoma of colon    Unspecified symptom associated with female genital organs    Past Surgical History:  Procedure Laterality Date   CARDIAC VALVE SURGERY  1998   COLONOSCOPY  7/03   polyps and tics   DIAGNOSTIC LAPAROSCOPY     KNEE ARTHROSCOPY     right   KNEE SURGERY  10/04   LAPAROSCOPY N/A 11/12/2014   Procedure: LAPAROSCOPY DIAGNOSTIC;  Surgeon: Molli Posey, MD;  Location: [facility];  Service: Gynecology;  Laterality: N/A;   PARTIAL HYSTERECTOMY     POLYPECTOMY      RECTAL POLYPECTOMY  8/07   ROTATOR CUFF REPAIR Right    2   SALPINGOOPHORECTOMY Right 11/12/2014   Procedure: SALPINGO OOPHORECTOMY;  Surgeon: Molli Posey, MD;  Location: [facility];  Service: Gynecology;  Laterality: Right;   WRIST SURGERY Right    x 2   Social History   Tobacco Use   Smoking status: Former    Packs/day: 0.80    Years: 30.00    Pack years: 24.00    Types: Cigarettes    Quit date: 03/01/2002    Years since quitting: 19.1   Smokeless tobacco: Never  Vaping Use   Vaping Use: Never used  Substance Use Topics   Alcohol use: Not Currently   Drug use: No   Family History  Problem Relation Age of Onset   Colon polyps Mother    Diabetes Mother    Hypertension Mother    Diabetes Maternal Aunt    Heart disease Maternal Aunt    Heart disease Maternal Uncle    Cancer Paternal Aunt        type unknown   Cerebral aneurysm Paternal Aunt    Cancer Paternal Aunt        x 2 , types unknown   Heart disease Maternal Grandmother    Cerebral aneurysm Cousin    Colon cancer Neg Hx    Esophageal cancer Neg Hx    Rectal cancer Neg Hx    Stomach cancer Neg Hx    Pancreatic cancer Neg Hx    Allergies  Allergen Reactions   Lisinopril     cough   Other Rash    All citrus- lemons, oranges, grapefruit, watermelon, cantelope - all fruits with juices    Current Outpatient Medications on File Prior to Visit  Medication Sig Dispense Refill   Acetaminophen (TYLENOL ARTHRITIS EXT RELIEF PO) Take 2 tablets by mouth daily.     albuterol (PROAIR HFA) 108 (90 Base) MCG/ACT inhaler Inhale 2 puffs into the lungs every 6 (six) hours as needed for wheezing or shortness of breath. 18 g 5   AMBULATORY NON FORMULARY MEDICATION Medication Name: Nitroglycerin ointment 0.125% use pea sized amount three times a day per rectum for 6-8 weeks 30 g 1   amLODipine (NORVASC) 10 MG tablet Take 1 tablet (10 mg total) by mouth daily. 90 tablet 3   hydrocortisone (ANUSOL-HC) 25 MG suppository Place 1  suppository (25 mg total) rectally at bedtime. 12 suppository 3   methocarbamol (ROBAXIN) 500 MG tablet TAKE 1 TABLET BY MOUTH EVERY 8 HOURS AS NEEDED FOR MUSCLE SPASM (BACK  PAIN)  (WATCH  OUT  FOR  SEDATION) 30 tablet 2   mometasone (NASONEX) 50 MCG/ACT nasal spray Place 2 sprays into the nose daily. 1 each 12   omeprazole (PRILOSEC) 20 MG capsule Take 1 capsule (20 mg total) by mouth daily. 90 capsule 3   potassium chloride SA (KLOR-CON) 20 MEQ tablet Take 1 tablet (20 mEq total) by mouth daily. 90 tablet 3   VITAMIN D, CHOLECALCIFEROL, PO Take by mouth.     No current facility-administered medications on file prior to visit.     Review of Systems  Constitutional:  Positive for fatigue. Negative for activity change, appetite change, fever and unexpected weight change.  HENT:  Negative for congestion, ear pain, rhinorrhea, sinus pressure and sore throat.   Eyes:  Negative for pain, redness and visual disturbance.  Respiratory:  Negative for cough, shortness of breath and wheezing.   Cardiovascular:  Negative for chest pain and palpitations.  Gastrointestinal:  Negative for abdominal pain, blood in stool, constipation and diarrhea.  Endocrine: Negative for polydipsia and polyuria.  Genitourinary:  Negative for dysuria, frequency and urgency.  Musculoskeletal:  Negative for arthralgias, back pain and myalgias.  Skin:  Negative for pallor and rash.  Allergic/Immunologic: Negative for environmental allergies.  Neurological:  Positive for dizziness. Negative for syncope and headaches.  Hematological:  Negative for adenopathy. Does not bruise/bleed easily.  Psychiatric/Behavioral:  Negative for decreased concentration and dysphoric mood. The patient is not nervous/anxious.       Objective:   Physical Exam Constitutional:      General: She is not in acute distress.    Appearance: Normal appearance. She is well-developed. She is obese. She is not ill-appearing or diaphoretic.  HENT:      Head: Normocephalic and atraumatic.     Right Ear: Tympanic membrane and ear canal normal.     Left Ear: Tympanic membrane and ear canal normal.  Eyes:     General:        Right eye: No discharge.        Left eye: No discharge.     Extraocular Movements: Extraocular movements intact.     Conjunctiva/sclera: Conjunctivae normal.     Pupils: Pupils are equal, round, and reactive to light.     Comments: No nystagmus   Neck:     Thyroid: No thyromegaly.     Vascular: No carotid bruit or JVD.  Cardiovascular:     Rate and Rhythm: Regular rhythm. Bradycardia present.     Heart sounds: Normal heart sounds.    No gallop.  Pulmonary:     Effort: Pulmonary effort is normal. No respiratory distress.     Breath sounds: Normal breath sounds. No wheezing or rales.  Abdominal:     General: Bowel sounds are normal. There is no distension or abdominal bruit.     Palpations: Abdomen is soft. There is no mass.     Tenderness: There is no abdominal tenderness.  Musculoskeletal:     Cervical back: Normal range of motion and neck supple.     Right lower leg: No edema.     Left lower leg: No edema.  Lymphadenopathy:     Cervical: No cervical adenopathy.  Skin:    General: Skin is warm and dry.     Coloration: Skin is not jaundiced or pale.     Findings: No erythema or rash.  Neurological:     Mental Status: She is alert.     Cranial Nerves: No cranial nerve deficit.  Sensory: No sensory deficit.     Motor: No weakness, abnormal muscle tone or pronator drift.     Coordination: Romberg sign negative. Coordination normal.     Gait: Gait normal.     Deep Tendon Reflexes: Reflexes are normal and symmetric. Reflexes normal.  Psychiatric:        Mood and Affect: Mood normal.          Assessment & Plan:   Problem List Items Addressed This Visit       Cardiovascular and Mediastinum   Essential hypertension - Primary    bp in fair control at this time  BP Readings from Last 1  Encounters:  04/27/21 128/78  No changes needed Most recent labs reviewed  Disc lifstyle change with low sodium diet and exercise  Pt thinks the inc dose of amlodipine at 10 mg is causing dizziness (reassuring exam)  inst her to cut back to 5 and update in a week re: both bp and dizziness  Planning f/u soon for skin tags so will be able to re check this Cannot take ace due to cough Thinks losartan caused dizziness also  Pt is hesitant to try diuretic due to low K         Musculoskeletal and Integument   Skin tag    Painful irritated skin tag on R neck with thin stalk  Pt will f/u for appt to remove it        Other   Dizziness    Pt thinks this is due to inc in dose of amlodipine to 10 mg  Will cut back to 5 mg and update in a week   Reassuring exam  ER precautions discussed       Prediabetes    Lab Results  Component Value Date   HGBA1C 6.3 (A) 02/24/2021  disc imp of low glycemic diet and wt loss to prevent DM2       Vitamin D deficiency    Last D level 32 Just finished another 12 wk course of high dose weekly tx Some aches and pains  Lab ordered today      Relevant Orders   VITAMIN D 25 Hydroxy (Vit-D Deficiency, Fractures) (Completed)

## 2021-04-27 NOTE — Assessment & Plan Note (Signed)
Lab Results  Component Value Date   HGBA1C 6.3 (A) 02/24/2021   disc imp of low glycemic diet and wt loss to prevent DM2

## 2021-04-27 NOTE — Assessment & Plan Note (Signed)
Pt thinks this is due to inc in dose of amlodipine to 10 mg  Will cut back to 5 mg and update in a week   Reassuring exam  ER precautions discussed

## 2021-04-27 NOTE — Assessment & Plan Note (Addendum)
Last D level 32 Just finished another 12 wk course of high dose weekly tx Some aches and pains  Lab ordered today

## 2021-04-27 NOTE — Assessment & Plan Note (Signed)
Painful irritated skin tag on R neck with thin stalk  Pt will f/u for appt to remove it

## 2021-04-27 NOTE — Assessment & Plan Note (Addendum)
bp in fair control at this time  BP Readings from Last 1 Encounters:  04/27/21 128/78   No changes needed Most recent labs reviewed  Disc lifstyle change with low sodium diet and exercise  Pt thinks the inc dose of amlodipine at 10 mg is causing dizziness (reassuring exam)  inst her to cut back to 5 and update in a week re: both bp and dizziness  Planning f/u soon for skin tags so will be able to re check this Cannot take ace due to cough Thinks losartan caused dizziness also  Pt is hesitant to try diuretic due to low K

## 2021-05-04 ENCOUNTER — Ambulatory Visit: Payer: BC Managed Care – PPO | Admitting: Family Medicine

## 2021-08-11 ENCOUNTER — Other Ambulatory Visit: Payer: Self-pay | Admitting: Family Medicine

## 2021-08-11 DIAGNOSIS — Z1231 Encounter for screening mammogram for malignant neoplasm of breast: Secondary | ICD-10-CM

## 2021-08-16 ENCOUNTER — Other Ambulatory Visit: Payer: Self-pay | Admitting: Family Medicine

## 2021-08-31 ENCOUNTER — Ambulatory Visit
Admission: RE | Admit: 2021-08-31 | Discharge: 2021-08-31 | Disposition: A | Discharge status: Home / Self Care | Payer: BC Managed Care – PPO | Source: Ambulatory Visit | Attending: Family Medicine | Admitting: Family Medicine

## 2021-08-31 DIAGNOSIS — Z1231 Encounter for screening mammogram for malignant neoplasm of breast: Secondary | ICD-10-CM

## 2021-09-14 ENCOUNTER — Telehealth: Payer: Self-pay

## 2021-09-14 ENCOUNTER — Emergency Department (HOSPITAL_COMMUNITY): Payer: BC Managed Care – PPO

## 2021-09-14 ENCOUNTER — Encounter (HOSPITAL_COMMUNITY): Payer: Self-pay | Admitting: Emergency Medicine

## 2021-09-14 ENCOUNTER — Emergency Department (HOSPITAL_COMMUNITY)
Admission: EM | Admit: 2021-09-14 | Discharge: 2021-09-14 | Disposition: A | Discharge status: Home / Self Care | Payer: BC Managed Care – PPO | Attending: Emergency Medicine | Admitting: Emergency Medicine

## 2021-09-14 ENCOUNTER — Other Ambulatory Visit: Payer: Self-pay

## 2021-09-14 DIAGNOSIS — R0602 Shortness of breath: Secondary | ICD-10-CM | POA: Diagnosis not present

## 2021-09-14 DIAGNOSIS — R531 Weakness: Secondary | ICD-10-CM | POA: Diagnosis not present

## 2021-09-14 DIAGNOSIS — R079 Chest pain, unspecified: Secondary | ICD-10-CM | POA: Diagnosis not present

## 2021-09-14 DIAGNOSIS — R0789 Other chest pain: Secondary | ICD-10-CM | POA: Diagnosis not present

## 2021-09-14 LAB — CBC
HCT: 38.6 % (ref 36.0–46.0)
Hemoglobin: 12 g/dL (ref 12.0–15.0)
MCH: 25.9 pg — ABNORMAL LOW (ref 26.0–34.0)
MCHC: 31.1 g/dL (ref 30.0–36.0)
MCV: 83.2 fL (ref 80.0–100.0)
Platelets: 288 10*3/uL (ref 150–400)
RBC: 4.64 MIL/uL (ref 3.87–5.11)
RDW: 14.5 % (ref 11.5–15.5)
WBC: 5.5 10*3/uL (ref 4.0–10.5)
nRBC: 0 % (ref 0.0–0.2)

## 2021-09-14 LAB — BASIC METABOLIC PANEL
Anion gap: 7 (ref 5–15)
BUN: 15 mg/dL (ref 8–23)
CO2: 27 mmol/L (ref 22–32)
Calcium: 9 mg/dL (ref 8.9–10.3)
Chloride: 107 mmol/L (ref 98–111)
Creatinine, Ser: 0.74 mg/dL (ref 0.44–1.00)
GFR, Estimated: >60 mL/min (ref >60)
Glucose, Bld: 113 mg/dL — ABNORMAL HIGH (ref 70–99)
Potassium: 3.5 mmol/L (ref 3.5–5.1)
Sodium: 141 mmol/L (ref 135–145)

## 2021-09-14 LAB — TROPONIN I (HIGH SENSITIVITY)
Troponin I (High Sensitivity): 4 ng/L (ref <18)
Troponin I (High Sensitivity): 4 ng/L (ref <18)

## 2021-09-14 NOTE — Telephone Encounter (Signed)
Per chart review tab pt is at [facility] ED now. Sending note to Dr Glori Bickers and [place] CMA.

## 2021-09-14 NOTE — ED Provider Notes (Signed)
[place] DEPT Provider Note   CSN: [id number] Arrival date & time: 09/14/21  1001     History {Add pertinent medical, surgical, social history, OB history to HPI:1} Chief Complaint  Patient presents with   Chest Pain    Crystal Tucker is a 61 y.o. female.  HPI Crystal Tucker , a 61 y.o. female  was evaluated in triage.  Pt complains of chest heaviness.  Onset while driving to work, initially 10/10, now 3/10.  No associated diaphoresis, nausea or vomiting.  She has not had any dizziness.  She was able to drive here.  She does not smoke.    Home Medications Prior to Admission medications   Medication Sig Start Date End Date Taking? Authorizing Provider  Acetaminophen (TYLENOL ARTHRITIS EXT RELIEF PO) Take 2 tablets by mouth daily.    [provider]  albuterol (PROAIR HFA) 108 (90 Base) MCG/ACT inhaler Inhale 2 puffs into the lungs every 6 (six) hours as needed for wheezing or shortness of breath. 10/20/16   Tower, Wynelle Fanny, MD  AMBULATORY NON FORMULARY MEDICATION Medication Name: Nitroglycerin ointment 0.125% use pea sized amount three times a day per rectum for 6-8 weeks 12/23/20   Mauri Pole, MD  amLODipine (NORVASC) 10 MG tablet Take 1 tablet (10 mg total) by mouth daily. 02/24/21   Tower, Wynelle Fanny, MD  hydrocortisone (ANUSOL-HC) 25 MG suppository Place 1 suppository (25 mg total) rectally at bedtime. 10/31/19   Tower, Wynelle Fanny, MD  methocarbamol (ROBAXIN) 500 MG tablet TAKE 1 TABLET BY MOUTH EVERY 8 HOURS AS NEEDED FOR MUSCLE SPASM (BACK  PAIN)  (WATCH  OUT  FOR  SEDATION) 11/16/20   Tower, Wynelle Fanny, MD  mometasone (NASONEX) 50 MCG/ACT nasal spray Place 2 sprays into the nose daily. 10/31/19   Tower, Wynelle Fanny, MD  omeprazole (PRILOSEC) 20 MG capsule Take 1 capsule by mouth once daily 08/18/21   Tower, Osterdock A, MD  potassium chloride SA (KLOR-CON M) 20 MEQ tablet Take 1 tablet by mouth once daily 08/18/21   Tower, Wynelle Fanny, MD  VITAMIN D,  CHOLECALCIFEROL, PO Take by mouth.    [provider]      Allergies    Lisinopril and Other    Review of Systems   Review of Systems  Physical Exam Updated Vital Signs BP (!) 181/78   Pulse (!) 59   Temp 98.4 F (36.9 C) (Oral)   Resp 13   Ht '5\' 7"'$  (1.702 m)   Wt 104.3 kg   SpO2 99%   BMI 36.02 kg/m  Physical Exam Vitals and nursing note reviewed.  Constitutional:      General: She is not in acute distress.    Appearance: She is well-developed. She is obese. She is not ill-appearing or diaphoretic.  HENT:     Head: Normocephalic and atraumatic.     Right Ear: External ear normal.     Left Ear: External ear normal.  Eyes:     Conjunctiva/sclera: Conjunctivae normal.     Pupils: Pupils are equal, round, and reactive to light.  Neck:     Trachea: Phonation normal.  Cardiovascular:     Rate and Rhythm: Normal rate and regular rhythm.     Heart sounds: Normal heart sounds.  Pulmonary:     Effort: Pulmonary effort is normal.     Breath sounds: Normal breath sounds.  Chest:     Chest wall: No tenderness.  Abdominal:  General: There is no distension.  Musculoskeletal:        General: Normal range of motion.     Cervical back: Normal range of motion and neck supple.  Skin:    General: Skin is warm and dry.  Neurological:     Mental Status: She is alert and oriented to person, place, and time.     Cranial Nerves: No cranial nerve deficit.     Sensory: No sensory deficit.     Motor: No abnormal muscle tone.     Coordination: Coordination normal.  Psychiatric:        Mood and Affect: Mood normal.        Behavior: Behavior normal.        Thought Content: Thought content normal.        Judgment: Judgment normal.     ED Results / Procedures / Treatments   Labs (all labs ordered are listed, but only abnormal results are displayed) Labs Reviewed  BASIC METABOLIC PANEL - Abnormal; Notable for the following components:      Result Value   Glucose, Bld  113 (*)    All other components within normal limits  CBC - Abnormal; Notable for the following components:   MCH 25.9 (*)    All other components within normal limits  TROPONIN I (HIGH SENSITIVITY)  TROPONIN I (HIGH SENSITIVITY)    EKG EKG Interpretation  Date/Time:  Monday September 14 2021 10:07:40 EDT Ventricular Rate:  58 PR Interval:  184 QRS Duration: 111 QT Interval:  442 QTC Calculation: 435 R Axis:   66 Text Interpretation: Sinus rhythm Baseline wander in lead(s) V5 V6 since last tracing no significant change Confirmed by Daleen Bo [phone]) on 09/14/2021 10:37:23 AM  Radiology DG Chest 2 View  Result Date: 09/14/2021 CLINICAL DATA:  Chest Pain; central chest pain and weakness. Patient denies shortness of breath. Former smoker. 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 EXAM: CHEST - 2 VIEW COMPARISON:  None Available. FINDINGS: Mild cardiomegaly. Lungs are clear without focal consolidation, pleural effusion or vascular congestion. Sternotomy wires. Bony thorax is unremarkable. IMPRESSION: Mild cardiomegaly.  Lungs are clear. Electronically Signed   By: Frazier Richards M.D.   On: 09/14/2021 10:39    Procedures Procedures  {Document cardiac monitor, telemetry assessment procedure when appropriate:1}  Medications Ordered in ED Medications - No data to display  ED Course/ Medical Decision Making/ A&P                           Medical Decision Making Amount and/or Complexity of Data Reviewed Labs: ordered. Radiology: ordered.   ***  {Document critical care time when appropriate:1} {Document review of labs and clinical decision tools ie heart score, Chads2Vasc2 etc:1}  {Document your independent review of radiology images, and any outside records:1} {Document your discussion with family members, caretakers, and with consultants:1} {Document social determinants of health affecting pt's care:1} {Document your decision making why or  why not admission, treatments were needed:1} Final Clinical Impression(s) / ED Diagnoses Final diagnoses:  Nonspecific chest pain    Rx / DC Orders ED Discharge Orders     None

## 2021-09-14 NOTE — Telephone Encounter (Signed)
[facility] [facility] - Client TELEPHONE ADVICE RECORD [facility] Patient Name: Crystal Tucker Gender: Female DOB: 12-26-1960 Age: 61 Y 40 M 13 D Return Phone Number: [phone] (Primary) Address: City/ State/ Zip: [place]  [address] Client [facility] - Client Client Site [place] Provider Tower, Roque Lias - MD Contact Type Call Who Is Calling Patient / Member / Family / Caregiver Call Type Triage / Clinical Relationship To Patient Self Return Phone Number [phone] (Primary) Chief Complaint CHEST PAIN - pain, pressure, heaviness or tightness Reason for Call Symptomatic / Request for [place] states patient is having chest pains this morning. Her blood pressure is high. Translation No Nurse Assessment Nurse: Susy Manor, RN, Megan Date/Time (Eastern Time): 09/14/2021 8:20:47 AM Confirm and document reason for call. If symptomatic, describe symptoms. ---Caller states patient is having chest pains this morning. Her blood pressure is high (149/89, HR 67). Does the patient have any new or worsening symptoms? ---Yes Will a triage be completed? ---Yes Related visit to physician within the last 2 weeks? ---No Does the PT have any chronic conditions? (i.e. diabetes, asthma, this includes High risk factors for pregnancy, etc.) ---Yes List chronic conditions. ---HTN, Is this a behavioral health or substance abuse call? ---No Guidelines Guideline Title Affirmed Question Affirmed Notes Nurse Date/Time (Eastern Time) Chest Pain [1] Chest pain lasts > 5 minutes AND [2] occurred in past 3 days (72 hours) (Exception: Feels exactly the same as previously diagnosed heartburn and has accompanying sour taste in mouth.) Susy Manor, RN, Megan 09/14/2021 8:22:08 AM PLEASE NOTE: All timestamps contained within this report are represented as [place] Standard  Time. CONFIDENTIALTY NOTICE: This fax transmission is intended only for the addressee. It contains information that is legally privileged, confidential or otherwise protected from use or disclosure. If you are not the intended recipient, you are strictly prohibited from reviewing, disclosing, copying using or disseminating any of this information or taking any action in reliance on or regarding this information. If you have received this fax in error, please notify us immediately by telephone so that we can arrange for its return to [place]. Phone: [phone], Toll-Free: [phone], Fax: [phone] Page: 2 of 2 Call Id: [id number] [place]. Time Eilene Ghazi Time) Disposition Final User 09/14/2021 8:19:53 AM Send to Urgent Queue Olga Coaster 09/14/2021 8:37:16 AM Go to ED Now (or PCP triage) Yes Susy Manor, RN, Megan Final Disposition 09/14/2021 8:37:16 AM Go to ED Now (or PCP triage) Yes Susy Manor, RN, Megan Caller Disagree/Comply Comply Caller Understands Yes PreDisposition Call Doctor Care Advice Given Per Guideline GO TO ED NOW (OR PCP TRIAGE): * IF NO PCP (PRIMARY CARE PROVIDER) SECOND-LEVEL TRIAGE: You need to be seen within the next hour. Go to the [facility] at _____________ [place] as soon as you can. CARE ADVICE given per Chest Pain (Adult) guideline. CALL EMS IF: * Severe difficulty breathing occurs * Passes out or becomes too weak to stand * You become worse Comments User: Sula Soda, RN Date/Time (Eastern Time): 09/14/2021 8:37:51 AM no appointments with the office today Referrals GO TO FACILITY UNDECIDE

## 2021-09-14 NOTE — Telephone Encounter (Signed)
Aware, will watch for correspondence  

## 2021-09-14 NOTE — ED Notes (Signed)
Patient reports she will take her BP medication when she gets home.

## 2021-09-14 NOTE — ED Provider Triage Note (Signed)
Emergency Medicine Provider Triage Evaluation Note  Crystal Tucker , a 61 y.o. female  was evaluated in triage.  Pt complains of chest heaviness.  Onset while driving to work, initially 10/10, now 3/10.  No associated diaphoresis, nausea or vomiting.  She has not had any dizziness.  She was able to drive here.  She does not smoke  Review of Systems  Positive: Chest pain Negative: No cough, no fever, no dizziness  Physical Exam  BP (!) 168/81 (BP Location: Left Arm)   Pulse (!) 57   Temp 98.6 F (37 C) (Oral)   Resp 14   SpO2 96%  Gen:   Awake, no distress   Resp:  Normal effort normal lung sounds MSK:   Moves extremities without difficulty  Other:  Nontoxic appearance  Medical Decision Making  Medically screening exam initiated at 10:54 AM.  Appropriate orders placed.  Crystal Tucker was informed that the remainder of the evaluation will be completed by another provider, this initial triage assessment does not replace that evaluation, and the importance of remaining in the ED until their evaluation is complete.  Chest pain spontaneously improving.  Screening evaluation ordered.  No red flags, EKG nonacute.\   EKG Interpretation  Date/Time:  Monday September 14 2021 10:07:40 EDT Ventricular Rate:  58 PR Interval:  184 QRS Duration: 111 QT Interval:  442 QTC Calculation: 435 R Axis:   66 Text Interpretation: Sinus rhythm Baseline wander in lead(s) V5 V6 since last tracing no significant change Confirmed by Daleen Bo [phone]) on 09/14/2021 10:37:23 AM          Daleen Bo, MD 09/14/21 1055

## 2021-09-24 ENCOUNTER — Ambulatory Visit: Payer: BC Managed Care – PPO | Admitting: Family Medicine

## 2021-09-24 ENCOUNTER — Encounter: Payer: Self-pay | Admitting: Family Medicine

## 2021-09-24 VITALS — BP 141/80 | HR 66 | Temp 97.7°F | Ht 67.0 in | Wt 226.0 lb

## 2021-09-24 DIAGNOSIS — I10 Essential (primary) hypertension: Secondary | ICD-10-CM | POA: Diagnosis not present

## 2021-09-24 DIAGNOSIS — J0100 Acute maxillary sinusitis, unspecified: Secondary | ICD-10-CM | POA: Diagnosis not present

## 2021-09-24 DIAGNOSIS — R072 Precordial pain: Secondary | ICD-10-CM | POA: Diagnosis not present

## 2021-09-24 DIAGNOSIS — R079 Chest pain, unspecified: Secondary | ICD-10-CM | POA: Insufficient documentation

## 2021-09-24 DIAGNOSIS — J019 Acute sinusitis, unspecified: Secondary | ICD-10-CM | POA: Insufficient documentation

## 2021-09-24 MED ORDER — AMOXICILLIN-POT CLAVULANATE 875-125 MG PO TABS: 1.0000 | ORAL_TABLET | Freq: Two times a day (BID) | ORAL | 0 refills | Status: DC

## 2021-09-24 MED ORDER — FLUCONAZOLE 150 MG PO TABS: 150.0000 mg | ORAL_TABLET | Freq: Once | ORAL | 0 refills | Status: AC

## 2021-09-24 NOTE — Progress Notes (Signed)
Subjective:    Patient ID: Crystal Tucker, female    DOB: 1961/01/28, 61 y.o.   MRN: [id number]  HPI Pt presents for f/u of chest pain  Some sinus symptoms also   Wt Readings from Last 3 Encounters:  09/24/21 226 lb (102.5 kg)  09/14/21 230 lb (104.3 kg)  04/27/21 228 lb (103.4 kg)   35.40 kg/m  Sinus symptoms  Colored nasal drainage Pain in L upper teeth (this happens with sinus infx in past) Hurts in cheeks/ worse on the left  Pnd causes stomach upset Nasal congestion  Uses nasonex  No cough   Went to ER on 7/10 C/o chest heaviness while driving to work  A/p from ER provider Decision regarding hospitalization. Risk Details: Low risk for cardiac disease patient presenting with chest heaviness starting while at rest.  The pain was moderately severe then improved she did not have exacerbation of the pain in the ED she does not have clinical evidence for coronary ischemia or laboratory evidence for myocardial infarction.  There is no metabolic instability.  There is no evidence for pneumothorax, pneumonia or acute chest syndrome.  She is stable for discharge with symptomatic treatment.  I have encouraged her to follow-up with her PCP regarding the chest pain and the slightly abnormal EKG.  Per pt chest pain is better now  Went away before she left  Has not re occurred - just felt tired for a day and then felt fine   Told her to take her omeprazole every day instead of every other  Is gassy from that   Last echo was 2021   Had cardiac surgery as child    Lab Results  Component Value Date   CREATININE 0.74 09/14/2021   BUN 15 09/14/2021   NA 141 09/14/2021   K 3.5 09/14/2021   CL 107 09/14/2021   CO2 27 09/14/2021   Lab Results  Component Value Date   ALT 17 10/19/2020   AST 18 10/19/2020   ALKPHOS 65 10/19/2020   BILITOT 0.4 10/19/2020   Gfr over 60 Lab Results  Component Value Date   WBC 5.5 09/14/2021   HGB 12.0 09/14/2021   HCT 38.6 09/14/2021    MCV 83.2 09/14/2021   PLT 288 09/14/2021   DG Chest 2 View  Result Date: 09/14/2021 CLINICAL DATA:  Chest Pain; central chest pain and weakness. Patient denies shortness of breath. Former smoker. 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 EXAM: CHEST - 2 VIEW COMPARISON:  None Available. FINDINGS: Mild cardiomegaly. Lungs are clear without focal consolidation, pleural effusion or vascular congestion. Sternotomy wires. Bony thorax is unremarkable. IMPRESSION: Mild cardiomegaly.  Lungs are clear. Electronically Signed   By: Frazier Richards M.D.   On: 09/14/2021 10:39   MM 3D SCREEN BREAST BILATERAL  Result Date: 08/31/2021 CLINICAL DATA:  Screening. EXAM: DIGITAL SCREENING BILATERAL MAMMOGRAM WITH TOMOSYNTHESIS AND CAD TECHNIQUE: Bilateral screening digital craniocaudal and mediolateral oblique mammograms were obtained. Bilateral screening digital breast tomosynthesis was performed. The images were evaluated with computer-aided detection. COMPARISON:  Previous exam(s). ACR Breast Density Category c: The breast tissue is heterogeneously dense, which may obscure small masses. FINDINGS: There are no findings suspicious for malignancy. IMPRESSION: No mammographic evidence of malignancy. A result letter of this screening mammogram will be mailed directly to the patient. RECOMMENDATION: Screening mammogram in one year. (Code:SM-B-01Y) BI-RADS CATEGORY  1: Negative. Electronically Signed   By: Mallie Darting.D.  On: 08/31/2021 14:09    HTN bp is stable today  No cp or palpitations or headaches or edema  No side effects to medicines  . BP Readings from Last 3 Encounters:  09/24/21 (!) 141/80  09/14/21 (!) 181/78  04/27/21 128/78    Amlodipine 5 mg  Is tolerating it   Trying to eat better   Is engaged now -is thrilled to be getting married   Patient Active Problem List   Diagnosis Date Noted   Acute sinusitis 09/24/2021   Chest pain 09/24/2021   Skin tag  04/27/2021   OA (osteoarthritis) of knee 01/07/2021   Vitamin B12 deficiency 07/29/2020   Vitamin D deficiency 07/29/2020   Colon cancer screening 07/29/2020   Current use of proton pump inhibitor 07/16/2020   Back pain 04/10/2020   Chronic pain of left thumb 04/10/2020   Flatus 10/31/2019   Dizziness 09/24/2019   History of heart surgery 09/24/2019   Hemorrhoids 12/11/2018   Hyperlipidemia, mild 11/04/2016   Encounter for screening for HIV 11/03/2016   Encounter for hepatitis C screening test for low risk patient 11/03/2016   Encounter for screening mammogram for breast cancer 11/03/2015   Prediabetes 11/03/2015   Herpes zoster 10/28/2015   Obesity 11/10/2011   Encounter for general adult medical examination with abnormal findings 11/01/2011   Allergic rhinitis 07/16/2011   OSA (obstructive sleep apnea) 07/16/2011   DEPENDENT EDEMA, LEGS, BILATERAL 10/11/2008   Essential hypertension 10/26/2006   GERD 10/26/2006   Diverticulosis of large intestine 10/26/2006   COLONIC POLYPS, HX OF 10/26/2006   Past Medical History:  Diagnosis Date   Allergy    Anemia    NOS   Arthritis    Cardiac valve prolapse    leaking valve with surgery 1998   Contact with or exposure to venereal diseases    Diverticulosis of colon (without mention of hemorrhage)    Edema    GERD (gastroesophageal reflux disease)    H/O: pneumonia    Hypertension    Pneumonia    Pruritus of genital organs    Rash and other nonspecific skin eruption    Sleep apnea    Tubular adenoma of colon    Unspecified symptom associated with female genital organs    Past Surgical History:  Procedure Laterality Date   [place]   COLONOSCOPY  7/03   polyps and tics   DIAGNOSTIC LAPAROSCOPY     KNEE ARTHROSCOPY     right   KNEE SURGERY  10/04   LAPAROSCOPY N/A 11/12/2014   Procedure: LAPAROSCOPY DIAGNOSTIC;  Surgeon: Molli Posey, MD;  Location: [facility];  Service: Gynecology;  Laterality: N/A;    PARTIAL HYSTERECTOMY     POLYPECTOMY     RECTAL POLYPECTOMY  8/07   ROTATOR CUFF REPAIR Right    2   SALPINGOOPHORECTOMY Right 11/12/2014   Procedure: SALPINGO OOPHORECTOMY;  Surgeon: Molli Posey, MD;  Location: [facility];  Service: Gynecology;  Laterality: Right;   WRIST SURGERY Right    x 2   Social History   Tobacco Use   Smoking status: Former    Packs/day: 0.80    Years: 30.00    Total pack years: 24.00    Types: Cigarettes    Quit date: 03/01/2002    Years since quitting: 19.5   Smokeless tobacco: Never  Vaping Use   Vaping Use: Never used  Substance Use Topics   Alcohol use: Not Currently   Drug use: No  Family History  Problem Relation Age of Onset   Colon polyps Mother    Diabetes Mother    Hypertension Mother    Diabetes Maternal Aunt    Heart disease Maternal Aunt    Heart disease Maternal Uncle    Cancer Paternal Aunt        type unknown   Cerebral aneurysm Paternal Aunt    Cancer Paternal Aunt        x 2 , types unknown   Heart disease Maternal Grandmother    Cerebral aneurysm Cousin    Colon cancer Neg Hx    Esophageal cancer Neg Hx    Rectal cancer Neg Hx    Stomach cancer Neg Hx    Pancreatic cancer Neg Hx    Breast cancer Neg Hx    Allergies  Allergen Reactions   Lisinopril     cough   Other Rash    All citrus- lemons, oranges, grapefruit, watermelon, cantelope - all fruits with juices    Current Outpatient Medications on File Prior to Visit  Medication Sig Dispense Refill   Acetaminophen (TYLENOL ARTHRITIS EXT RELIEF PO) Take 2 tablets by mouth daily.     albuterol (PROAIR HFA) 108 (90 Base) MCG/ACT inhaler Inhale 2 puffs into the lungs every 6 (six) hours as needed for wheezing or shortness of breath. 18 g 5   AMBULATORY NON FORMULARY MEDICATION Medication Name: Nitroglycerin ointment 0.125% use pea sized amount three times a day per rectum for 6-8 weeks 30 g 1   amLODipine (NORVASC) 10 MG tablet Take 1 tablet (10 mg total) by mouth  daily. 90 tablet 3   hydrocortisone (ANUSOL-HC) 25 MG suppository Place 1 suppository (25 mg total) rectally at bedtime. 12 suppository 3   methocarbamol (ROBAXIN) 500 MG tablet TAKE 1 TABLET BY MOUTH EVERY 8 HOURS AS NEEDED FOR MUSCLE SPASM (BACK  PAIN)  (WATCH  OUT  FOR  SEDATION) 30 tablet 2   mometasone (NASONEX) 50 MCG/ACT nasal spray Place 2 sprays into the nose daily. 1 each 12   omeprazole (PRILOSEC) 20 MG capsule Take 1 capsule by mouth once daily 90 capsule 0   potassium chloride SA (KLOR-CON M) 20 MEQ tablet Take 1 tablet by mouth once daily 90 tablet 0   VITAMIN D, CHOLECALCIFEROL, PO Take by mouth.     No current facility-administered medications on file prior to visit.     Review of Systems  Constitutional:  Negative for activity change, appetite change, fatigue, fever and unexpected weight change.  HENT:  Positive for congestion, sinus pressure and sinus pain. Negative for ear pain, rhinorrhea and sore throat.   Eyes:  Negative for pain, redness and visual disturbance.  Respiratory:  Negative for cough, shortness of breath and wheezing.   Cardiovascular:  Negative for chest pain and palpitations.  Gastrointestinal:  Negative for abdominal pain, blood in stool, constipation and diarrhea.  Endocrine: Negative for polydipsia and polyuria.  Genitourinary:  Negative for dysuria, frequency and urgency.  Musculoskeletal:  Negative for arthralgias, back pain and myalgias.  Skin:  Negative for pallor and rash.  Allergic/Immunologic: Negative for environmental allergies.  Neurological:  Negative for dizziness, syncope and headaches.  Hematological:  Negative for adenopathy. Does not bruise/bleed easily.  Psychiatric/Behavioral:  Negative for decreased concentration and dysphoric mood. The patient is not nervous/anxious.        Objective:   Physical Exam Constitutional:      General: She is not in acute distress.    Appearance: Normal  appearance. She is well-developed. She is  obese. She is not ill-appearing or diaphoretic.  HENT:     Head: Normocephalic and atraumatic.     Comments: L maxillary sinus tenderness No TMJ tenderness      Right Ear: Tympanic membrane, ear canal and external ear normal.     Left Ear: Tympanic membrane, ear canal and external ear normal.     Nose: Congestion present.     Mouth/Throat:     Mouth: Mucous membranes are moist.     Pharynx: Oropharynx is clear. No oropharyngeal exudate or posterior oropharyngeal erythema.  Eyes:     General:        Right eye: No discharge.        Left eye: No discharge.     Conjunctiva/sclera: Conjunctivae normal.     Pupils: Pupils are equal, round, and reactive to light.  Neck:     Thyroid: No thyromegaly.     Vascular: No carotid bruit or JVD.  Cardiovascular:     Rate and Rhythm: Normal rate and regular rhythm.     Heart sounds: Normal heart sounds.     No gallop.  Pulmonary:     Effort: Pulmonary effort is normal. No respiratory distress.     Breath sounds: Normal breath sounds. No wheezing or rales.  Chest:     Chest wall: No tenderness.  Abdominal:     General: There is no distension or abdominal bruit.     Palpations: Abdomen is soft.  Musculoskeletal:     Cervical back: Normal range of motion and neck supple. No tenderness.     Right lower leg: No edema.     Left lower leg: No edema.  Lymphadenopathy:     Cervical: No cervical adenopathy.  Skin:    General: Skin is warm and dry.     Coloration: Skin is not pale.     Findings: No rash.  Neurological:     Mental Status: She is alert.     Coordination: Coordination normal.     Deep Tendon Reflexes: Reflexes are normal and symmetric. Reflexes normal.  Psychiatric:        Mood and Affect: Mood normal.           Assessment & Plan:   Problem List Items Addressed This Visit       Cardiovascular and Mediastinum   Essential hypertension    bp in fair control at this time  BP Readings from Last 1 Encounters:  09/24/21  (!) 141/80  No changes needed Most recent labs reviewed  Disc lifstyle change with low sodium diet and exercise  Amlodipine 5 mg-tolerating this but not 10 mg         Respiratory   Acute sinusitis    With sinus pain/congestion -worse in L cheek Px augmentin  Symptom control reviewed  Nasal saline may help Update if not starting to improve in a week or if worsening  If not improved consider dental eval for L facial pain       Relevant Medications   amoxicillin-clavulanate (AUGMENTIN) 875-125 MG tablet   fluconazole (DIFLUCAN) 150 MG tablet     Other   Chest pain - Primary    Seen in ER 7/10 with reassuring w/u Reviewed hospital records, lab results and studies in detail   This is resolved with no recurrence  Taking her omeprazole every day now to prevent reflux (whith may have played a role)  reassuring exam

## 2021-09-24 NOTE — Assessment & Plan Note (Signed)
bp in fair control at this time  BP Readings from Last 1 Encounters:  09/24/21 (!) 141/80   No changes needed Most recent labs reviewed  Disc lifstyle change with low sodium diet and exercise  Amlodipine 5 mg-tolerating this but not 10 mg

## 2021-09-24 NOTE — Assessment & Plan Note (Signed)
Seen in ER 7/10 with reassuring w/u Reviewed hospital records, lab results and studies in detail   This is resolved with no recurrence  Taking her omeprazole every day now to prevent reflux (whith may have played a role)  reassuring exam

## 2021-09-24 NOTE — Patient Instructions (Addendum)
Alert [place] if chest pain returns   Make and appt for your physical with labs prior   Take care of yourself   Take augmentin for a sinus infection  Continue the nasal spray   Update if not starting to improve in a week or if worsening

## 2021-09-24 NOTE — Assessment & Plan Note (Signed)
With sinus pain/congestion -worse in L cheek Px augmentin  Symptom control reviewed  Nasal saline may help Update if not starting to improve in a week or if worsening  If not improved consider dental eval for L facial pain

## 2021-10-05 ENCOUNTER — Other Ambulatory Visit (INDEPENDENT_AMBULATORY_CARE_PROVIDER_SITE_OTHER): Payer: BC Managed Care – PPO

## 2021-10-05 ENCOUNTER — Telehealth: Payer: Self-pay | Admitting: Family Medicine

## 2021-10-05 DIAGNOSIS — Z79899 Other long term (current) drug therapy: Secondary | ICD-10-CM

## 2021-10-05 DIAGNOSIS — E559 Vitamin D deficiency, unspecified: Secondary | ICD-10-CM | POA: Diagnosis not present

## 2021-10-05 DIAGNOSIS — E538 Deficiency of other specified B group vitamins: Secondary | ICD-10-CM

## 2021-10-05 DIAGNOSIS — R7303 Prediabetes: Secondary | ICD-10-CM

## 2021-10-05 DIAGNOSIS — E785 Hyperlipidemia, unspecified: Secondary | ICD-10-CM

## 2021-10-05 DIAGNOSIS — I10 Essential (primary) hypertension: Secondary | ICD-10-CM

## 2021-10-05 LAB — LIPID PANEL
Cholesterol: 207 mg/dL — ABNORMAL HIGH (ref 0–200)
HDL: 57.6 mg/dL (ref >39.00)
LDL Cholesterol: 132 mg/dL — ABNORMAL HIGH (ref 0–99)
NonHDL: 149.54
Total CHOL/HDL Ratio: 4
Triglycerides: 87 mg/dL (ref 0.0–149.0)
VLDL: 17.4 mg/dL (ref 0.0–40.0)

## 2021-10-05 LAB — CBC WITH DIFFERENTIAL/PLATELET
Basophils Absolute: 0.1 10*3/uL (ref 0.0–0.1)
Basophils Relative: 1.3 % (ref 0.0–3.0)
Eosinophils Absolute: 0.1 10*3/uL (ref 0.0–0.7)
Eosinophils Relative: 2 % (ref 0.0–5.0)
HCT: 37 % (ref 36.0–46.0)
Hemoglobin: 11.8 g/dL — ABNORMAL LOW (ref 12.0–15.0)
Lymphocytes Relative: 33.5 % (ref 12.0–46.0)
Lymphs Abs: 1.8 10*3/uL (ref 0.7–4.0)
MCHC: 32 g/dL (ref 30.0–36.0)
MCV: 81.3 fl (ref 78.0–100.0)
Monocytes Absolute: 0.3 10*3/uL (ref 0.1–1.0)
Monocytes Relative: 6.3 % (ref 3.0–12.0)
Neutro Abs: 3 10*3/uL (ref 1.4–7.7)
Neutrophils Relative %: 56.9 % (ref 43.0–77.0)
Platelets: 266 10*3/uL (ref 150.0–400.0)
RBC: 4.54 Mil/uL (ref 3.87–5.11)
RDW: 14.6 % (ref 11.5–15.5)
WBC: 5.3 10*3/uL (ref 4.0–10.5)

## 2021-10-05 LAB — VITAMIN D 25 HYDROXY (VIT D DEFICIENCY, FRACTURES): VITD: 22.91 ng/mL — ABNORMAL LOW (ref 30.00–100.00)

## 2021-10-05 LAB — VITAMIN B12: Vitamin B-12: 223 pg/mL (ref 211–911)

## 2021-10-05 LAB — COMPREHENSIVE METABOLIC PANEL
ALT: 13 U/L (ref 0–35)
AST: 13 U/L (ref 0–37)
Albumin: 4 g/dL (ref 3.5–5.2)
Alkaline Phosphatase: 59 U/L (ref 39–117)
BUN: 16 mg/dL (ref 6–23)
CO2: 28 mEq/L (ref 19–32)
Calcium: 9 mg/dL (ref 8.4–10.5)
Chloride: 105 mEq/L (ref 96–112)
Creatinine, Ser: 0.77 mg/dL (ref 0.40–1.20)
GFR: 83.21 mL/min (ref >60.00)
Glucose, Bld: 114 mg/dL — ABNORMAL HIGH (ref 70–99)
Potassium: 4.4 mEq/L (ref 3.5–5.1)
Sodium: 142 mEq/L (ref 135–145)
Total Bilirubin: 0.3 mg/dL (ref 0.2–1.2)
Total Protein: 6.8 g/dL (ref 6.0–8.3)

## 2021-10-05 LAB — TSH: TSH: 1.02 u[IU]/mL (ref 0.35–5.50)

## 2021-10-05 LAB — HEMOGLOBIN A1C: Hgb A1c MFr Bld: 6.7 % — ABNORMAL HIGH (ref 4.6–6.5)

## 2021-10-05 NOTE — Telephone Encounter (Signed)
-----   Message from Crystal Tucker sent at 10/05/2021  7:53 AM EDT ----- Regarding: lab orders asap Patient is scheduled for CPX labs, please order future labs, Thanks , Karna Christmas

## 2021-10-12 ENCOUNTER — Ambulatory Visit (INDEPENDENT_AMBULATORY_CARE_PROVIDER_SITE_OTHER): Payer: BC Managed Care – PPO | Admitting: Family Medicine

## 2021-10-12 ENCOUNTER — Encounter: Payer: Self-pay | Admitting: Family Medicine

## 2021-10-12 VITALS — BP 131/70 | HR 84 | Ht 67.0 in | Wt 223.8 lb

## 2021-10-12 DIAGNOSIS — I10 Essential (primary) hypertension: Secondary | ICD-10-CM

## 2021-10-12 DIAGNOSIS — E538 Deficiency of other specified B group vitamins: Secondary | ICD-10-CM

## 2021-10-12 DIAGNOSIS — K219 Gastro-esophageal reflux disease without esophagitis: Secondary | ICD-10-CM

## 2021-10-12 DIAGNOSIS — Z0000 Encounter for general adult medical examination without abnormal findings: Secondary | ICD-10-CM

## 2021-10-12 DIAGNOSIS — Z79899 Other long term (current) drug therapy: Secondary | ICD-10-CM | POA: Diagnosis not present

## 2021-10-12 DIAGNOSIS — E559 Vitamin D deficiency, unspecified: Secondary | ICD-10-CM

## 2021-10-12 DIAGNOSIS — R7303 Prediabetes: Secondary | ICD-10-CM | POA: Diagnosis not present

## 2021-10-12 DIAGNOSIS — E785 Hyperlipidemia, unspecified: Secondary | ICD-10-CM | POA: Diagnosis not present

## 2021-10-12 NOTE — Assessment & Plan Note (Signed)
Reviewed health habits including diet and exercise and skin cancer prevention Reviewed appropriate screening tests for age  Also reviewed health mt list, fam hx and immunization status , as well as social and family history   See HPI Labs reviewed  Plans to schedule her eye exam microalb obt  Had shingrix-she will call with dates for her chart Mammogram utd 08/2021 Colonoscopy utd 01/2021 with 5 y recall

## 2021-10-12 NOTE — Assessment & Plan Note (Addendum)
Takes omeprazole as needed  Enc to watch diet

## 2021-10-12 NOTE — Assessment & Plan Note (Signed)
Lab Results  Component Value Date   [id number] 10/05/2021   Was high after shots in past  Adv to take 500 mcg daily otc

## 2021-10-12 NOTE — Progress Notes (Signed)
Subjective:    Patient ID: Crystal Tucker, female    DOB: Aug 01, 1960, 61 y.o.   MRN: [id number]  HPI Here for health maintenance exam and to review chronic medical problems    Wt Readings from Last 3 Encounters:  10/12/21 223 lb 12.8 oz (101.5 kg)  09/24/21 226 lb (102.5 kg)  09/14/21 230 lb (104.3 kg)   35.05 kg/m Feels pretty good  Taking care of herself   Immunization History  Administered Date(s) Administered   Influenza Whole 02/08/2005   Influenza,inj,Quad PF,6+ Mos 02/20/2013   Influenza-Unspecified 11/07/2020   Moderna Sars-Covid-2 Vaccination 03/05/2019, 04/02/2019   Tdap 11/10/2011   Health Maintenance Due  Topic Date Due   OPHTHALMOLOGY EXAM  Never done   URINE MICROALBUMIN  Never done   Zoster Vaccines- Shingrix (1 of 2) Never done   FOOT EXAM  07/29/2021   INFLUENZA VACCINE  10/06/2021   Eye exam: due for   Microalb due : ordered   Shingrix: had them   Mammogram 08/2021 Self breast exam: no lumps or changes   Colonoscopy 01/2021 with 5 y recall   HTN bp is stable today  No cp or palpitations or headaches or edema  No side effects to medicines  BP Readings from Last 3 Encounters:  10/12/21 (!) 152/84  09/24/21 (!) 141/80  09/14/21 (!) 181/78    Amlodipine 5 mg daily  Does not tolerate 10 mg  Cannot take lisinopril -caused cough  Losartan caused dizziness   She does not use salt   She checks at work- usually ok   No diuretic due to baseline low K    Lab Results  Component Value Date   CREATININE 0.77 10/05/2021   BUN 16 10/05/2021   NA 142 10/05/2021   K 4.4 10/05/2021   CL 105 10/05/2021   CO2 28 10/05/2021    GERD Omeprazole every other day  Lab Results  Component Value Date   VITAMINB12 223 10/05/2021   Hyperlipidemia Lab Results  Component Value Date   CHOL 207 (H) 10/05/2021   CHOL 197 07/16/2020   CHOL 195 05/31/2019   Lab Results  Component Value Date   HDL 57.60 10/05/2021   HDL 58.10 07/16/2020   HDL  55.50 05/31/2019   Lab Results  Component Value Date   LDLCALC 132 (H) 10/05/2021   LDLCALC 123 (H) 07/16/2020   LDLCALC 126 (H) 05/31/2019   Lab Results  Component Value Date   TRIG 87.0 10/05/2021   TRIG 76.0 07/16/2020   TRIG 66.0 05/31/2019   Lab Results  Component Value Date   CHOLHDL 4 10/05/2021   CHOLHDL 3 07/16/2020   CHOLHDL 4 05/31/2019   No results found for: "LDLDIRECT" Not on a statin  Has eaten fries- cut back on them   Diet for the past 3 months has been all over the place  Trying to get settled on her own   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 for herself    Prediabetes Now a1c in Dm range  Lab Results  Component Value Date   HGBA1C 6.7 (H) 10/05/2021   D level is low at 22.9   In the past metformin helped but gave her some diarrhea  Off wagon with sweets/chocolate   Lab Results  Component Value Date   ALT 13 10/05/2021   AST 13 10/05/2021   ALKPHOS 59 10/05/2021   BILITOT 0.3 10/05/2021   Lab Results  Component Value Date  TSH 1.02 10/05/2021    Lab Results  Component Value Date   WBC 5.3 10/05/2021   HGB 11.8 (L) 10/05/2021   HCT 37.0 10/05/2021   MCV 81.3 10/05/2021   PLT 266.0 10/05/2021    Patient Active Problem List   Diagnosis Date Noted   Routine general medical examination at a health care facility 10/12/2021   Skin tag 04/27/2021   OA (osteoarthritis) of knee 01/07/2021   Vitamin B12 deficiency 07/29/2020   Vitamin D deficiency 07/29/2020   Colon cancer screening 07/29/2020   Current use of proton pump inhibitor 07/16/2020   Chronic pain of left thumb 04/10/2020   Flatus 10/31/2019   Dizziness 09/24/2019   History of heart surgery 09/24/2019   Hemorrhoids 12/11/2018   Hyperlipidemia, mild 11/04/2016   Encounter for screening for HIV 11/03/2016   Encounter for hepatitis C screening test for low risk patient 11/03/2016   Encounter for screening mammogram for breast cancer  11/03/2015   Prediabetes 11/03/2015   Herpes zoster 10/28/2015   Obesity 11/10/2011   Encounter for general adult medical examination with abnormal findings 11/01/2011   Allergic rhinitis 07/16/2011   OSA (obstructive sleep apnea) 07/16/2011   DEPENDENT EDEMA, LEGS, BILATERAL 10/11/2008   Essential hypertension 10/26/2006   GERD 10/26/2006   Diverticulosis of large intestine 10/26/2006   COLONIC POLYPS, HX OF 10/26/2006   Past Medical History:  Diagnosis Date   Allergy    Anemia    NOS   Arthritis    Cardiac valve prolapse    leaking valve with surgery 1998   Contact with or exposure to venereal diseases    Diverticulosis of colon (without mention of hemorrhage)    Edema    GERD (gastroesophageal reflux disease)    H/O: pneumonia    Hypertension    Pneumonia    Pruritus of genital organs    Rash and other nonspecific skin eruption    Sleep apnea    Tubular adenoma of colon    Unspecified symptom associated with female genital organs    Past Surgical History:  Procedure Laterality Date   [place]   COLONOSCOPY  7/03   polyps and tics   DIAGNOSTIC LAPAROSCOPY     KNEE ARTHROSCOPY     right   KNEE SURGERY  10/04   LAPAROSCOPY N/A 11/12/2014   Procedure: LAPAROSCOPY DIAGNOSTIC;  Surgeon: Molli Posey, MD;  Location: [facility];  Service: Gynecology;  Laterality: N/A;   PARTIAL HYSTERECTOMY     POLYPECTOMY     RECTAL POLYPECTOMY  8/07   ROTATOR CUFF REPAIR Right    2   SALPINGOOPHORECTOMY Right 11/12/2014   Procedure: SALPINGO OOPHORECTOMY;  Surgeon: Molli Posey, MD;  Location: [facility];  Service: Gynecology;  Laterality: Right;   WRIST SURGERY Right    x 2   Social History   Tobacco Use   Smoking status: Former    Packs/day: 0.80    Years: 30.00    Total pack years: 24.00    Types: Cigarettes    Quit date: 03/01/2002    Years since quitting: 19.6   Smokeless tobacco: Never  Vaping Use   Vaping Use: Never used  Substance Use Topics    Alcohol use: Not Currently   Drug use: No   Family History  Problem Relation Age of Onset   Colon polyps Mother    Diabetes Mother    Hypertension Mother    Diabetes Maternal Aunt    Heart disease Maternal Aunt  Heart disease Maternal Uncle    Cancer Paternal Aunt        type unknown   Cerebral aneurysm Paternal Aunt    Cancer Paternal Aunt        x 2 , types unknown   Heart disease Maternal Grandmother    Cerebral aneurysm Cousin    Colon cancer Neg Hx    Esophageal cancer Neg Hx    Rectal cancer Neg Hx    Stomach cancer Neg Hx    Pancreatic cancer Neg Hx    Breast cancer Neg Hx    Allergies  Allergen Reactions   Lisinopril     cough   Other Rash    All citrus- lemons, oranges, grapefruit, watermelon, cantelope - all fruits with juices    Current Outpatient Medications on File Prior to Visit  Medication Sig Dispense Refill   Acetaminophen (TYLENOL ARTHRITIS EXT RELIEF PO) Take 2 tablets by mouth daily.     albuterol (PROAIR HFA) 108 (90 Base) MCG/ACT inhaler Inhale 2 puffs into the lungs every 6 (six) hours as needed for wheezing or shortness of breath. 18 g 5   AMBULATORY NON FORMULARY MEDICATION Medication Name: Nitroglycerin ointment 0.125% use pea sized amount three times a day per rectum for 6-8 weeks 30 g 1   amLODipine (NORVASC) 10 MG tablet Take 1 tablet (10 mg total) by mouth daily. (Patient taking differently: Take 5 mg by mouth daily.) 90 tablet 3   cholecalciferol (VITAMIN D3) 25 MCG (1000 UNIT) tablet Take 2,000 Units by mouth daily.     hydrocortisone (ANUSOL-HC) 25 MG suppository Place 1 suppository (25 mg total) rectally at bedtime. 12 suppository 3   methocarbamol (ROBAXIN) 500 MG tablet TAKE 1 TABLET BY MOUTH EVERY 8 HOURS AS NEEDED FOR MUSCLE SPASM (BACK  PAIN)  (WATCH  OUT  FOR  SEDATION) 30 tablet 2   mometasone (NASONEX) 50 MCG/ACT nasal spray Place 2 sprays into the nose daily. 1 each 12   omeprazole (PRILOSEC) 20 MG capsule Take 1 capsule by mouth  once daily 90 capsule 0   potassium chloride SA (KLOR-CON M) 20 MEQ tablet Take 1 tablet by mouth once daily 90 tablet 0   vitamin B-12 (CYANOCOBALAMIN) 500 MCG tablet Take 500 mcg by mouth daily.     No current facility-administered medications on file prior to visit.     Review of Systems  Constitutional:  Positive for fatigue. Negative for activity change, appetite change, fever and unexpected weight change.  HENT:  Negative for congestion, ear pain, rhinorrhea, sinus pressure and sore throat.   Eyes:  Negative for pain, redness and visual disturbance.  Respiratory:  Negative for cough, shortness of breath and wheezing.   Cardiovascular:  Negative for chest pain and palpitations.  Gastrointestinal:  Negative for abdominal pain, blood in stool, constipation and diarrhea.  Endocrine: Negative for polydipsia and polyuria.  Genitourinary:  Negative for dysuria, frequency and urgency.  Musculoskeletal:  Positive for arthralgias. Negative for back pain and myalgias.  Skin:  Negative for pallor and rash.  Allergic/Immunologic: Negative for environmental allergies.  Neurological:  Negative for dizziness, syncope and headaches.  Hematological:  Negative for adenopathy. Does not bruise/bleed easily.  Psychiatric/Behavioral:  Negative for decreased concentration and dysphoric mood. The patient is not nervous/anxious.        Objective:   Physical Exam Constitutional:      General: She is not in acute distress.    Appearance: Normal appearance. She is well-developed. She is obese. She  is not ill-appearing or diaphoretic.  HENT:     Head: Normocephalic and atraumatic.     Right Ear: Tympanic membrane, ear canal and external ear normal.     Left Ear: Tympanic membrane, ear canal and external ear normal.     Nose: Nose normal. No congestion.     Mouth/Throat:     Mouth: Mucous membranes are moist.     Pharynx: Oropharynx is clear. No posterior oropharyngeal erythema.  Eyes:     General: No  scleral icterus.    Extraocular Movements: Extraocular movements intact.     Conjunctiva/sclera: Conjunctivae normal.     Pupils: Pupils are equal, round, and reactive to light.  Neck:     Thyroid: No thyromegaly.     Vascular: No carotid bruit or JVD.  Cardiovascular:     Rate and Rhythm: Normal rate and regular rhythm.     Pulses: Normal pulses.     Heart sounds: Normal heart sounds.     No gallop.  Pulmonary:     Effort: Pulmonary effort is normal. No respiratory distress.     Breath sounds: Normal breath sounds. No wheezing.     Comments: Good air exch Chest:     Chest wall: No tenderness.  Abdominal:     General: Bowel sounds are normal. There is no distension or abdominal bruit.     Palpations: Abdomen is soft. There is no mass.     Tenderness: There is no abdominal tenderness.     Hernia: No hernia is present.  Genitourinary:    Comments: Breast exam: No mass, nodules, thickening, tenderness, bulging, retraction, inflamation, nipple discharge or skin changes noted.  No axillary or clavicular LA.     Musculoskeletal:        General: No tenderness. Normal range of motion.     Cervical back: Normal range of motion and neck supple. No rigidity. No muscular tenderness.     Right lower leg: No edema.     Left lower leg: No edema.     Comments: No kyphosis   Lymphadenopathy:     Cervical: No cervical adenopathy.  Skin:    General: Skin is warm and dry.     Coloration: Skin is not pale.     Findings: No erythema or rash.     Comments: Some skin tags  Neurological:     Mental Status: She is alert. Mental status is at baseline.     Cranial Nerves: No cranial nerve deficit.     Motor: No abnormal muscle tone.     Coordination: Coordination normal.     Gait: Gait normal.     Deep Tendon Reflexes: Reflexes are normal and symmetric. Reflexes normal.  Psychiatric:        Mood and Affect: Mood normal.        Cognition and Memory: Cognition and memory normal.            Assessment & Plan:   Problem List Items Addressed This Visit       Cardiovascular and Mediastinum   Essential hypertension    bp in fair control at this time  BP Readings from Last 1 Encounters:  10/12/21 131/70  No changes needed Most recent labs reviewed  Disc lifstyle change with low sodium diet and exercise        Relevant Orders   Microalbumin/Creatinine Ratio, Urine     Digestive   GERD    Takes omeprazole as needed  Enc to watch diet  Other   Current use of proton pump inhibitor    B12 is low nl  D is low Disc replacement of these in setting of omeprazole      Hyperlipidemia, mild    Disc goals for lipids and reasons to control them Rev last labs with pt Rev low sat fat diet in detail  LDL is up  If a1c stays elevated will need a statin  Diet is sub opt (fried food) Will watch this F/u 3 mo lab and f/u      Relevant Orders   Microalbumin/Creatinine Ratio, Urine   Prediabetes    Lab Results  Component Value Date   HGBA1C 6.7 (H) 10/05/2021  a1c is up over DM range microalb ordered  Would like to work on diet for 3 mo before medication  F/u arranged Low glycemic eating reviewed  Also exercise  She will schedule eye exam       Relevant Orders   Microalbumin/Creatinine Ratio, Urine   Routine general medical examination at a health care facility - Primary    Reviewed health habits including diet and exercise and skin cancer prevention Reviewed appropriate screening tests for age  Also reviewed health mt list, fam hx and immunization status , as well as social and family history   See HPI Labs reviewed  Plans to schedule her eye exam microalb obt  Had shingrix-she will call with dates for her chart Mammogram utd 08/2021 Colonoscopy utd 01/2021 with 5 y recall         Vitamin B12 deficiency    Lab Results  Component Value Date   [id number] 10/05/2021  Was high after shots in past  Adv to take 500 mcg daily otc       Vitamin D deficiency    inst to increase D3 to 2000 iu daily  Level of 22.9 Imp to bone and overall health

## 2021-10-12 NOTE — Assessment & Plan Note (Signed)
Disc goals for lipids and reasons to control them Rev last labs with pt Rev low sat fat diet in detail  LDL is up  If a1c stays elevated will need a statin  Diet is sub opt (fried food) Will watch this F/u 3 mo lab and f/u

## 2021-10-12 NOTE — Patient Instructions (Addendum)
Please schedule your annual diabetic eye exam and have them send [place] a copy   Let us know the dates of your shingrix vaccine   Check your BP when relaxed at different times  Let us know if it stays over 140 top or 90 bottom   Watch your diet for sodium /processed foods   Get rid of sugar drinks  Cut the sweets   Think about what you want to do for exercise indoors    For diabetes Try to get most of your carbohydrates from produce (with the exception of white potatoes)  Eat less bread/pasta/rice/snack foods/cereals/sweets and other items from the middle of the grocery store (processed carbs)  For cholesterol Avoid red meat/ fried foods/ egg yolks/ fatty breakfast meats/ butter, cheese and high fat dairy/ and shellfish   Follow up in 3 months after fasting labs   Start oral vitamin B12 500 mcg daily   Increase your vitamin D3 to 2000 iu daily

## 2021-10-12 NOTE — Assessment & Plan Note (Signed)
inst to increase D3 to 2000 iu daily  Level of 22.9 Imp to bone and overall health

## 2021-10-12 NOTE — Assessment & Plan Note (Signed)
B12 is low nl  D is low Disc replacement of these in setting of omeprazole

## 2021-10-12 NOTE — Assessment & Plan Note (Signed)
Lab Results  Component Value Date   HGBA1C 6.7 (H) 10/05/2021   a1c is up over DM range microalb ordered  Would like to work on diet for 3 mo before medication  F/u arranged Low glycemic eating reviewed  Also exercise  She will schedule eye exam

## 2021-10-12 NOTE — Assessment & Plan Note (Signed)
bp in fair control at this time  BP Readings from Last 1 Encounters:  10/12/21 131/70   No changes needed Most recent labs reviewed  Disc lifstyle change with low sodium diet and exercise

## 2021-10-13 LAB — MICROALBUMIN / CREATININE URINE RATIO
Creatinine,U: 193.9 mg/dL
Microalb Creat Ratio: 1 mg/g (ref 0.0–30.0)
Microalb, Ur: 2 mg/dL — ABNORMAL HIGH (ref 0.0–1.9)

## 2021-10-26 ENCOUNTER — Telehealth: Payer: Self-pay | Admitting: Family Medicine

## 2021-10-26 NOTE — Telephone Encounter (Signed)
Called and spoke with patient she was calling to give that days of her shingles vaccines. Entered the days in the patient chart.

## 2021-10-26 NOTE — Telephone Encounter (Signed)
Patient called and stated she needs the dates that she had her shingle shot done added to her chart. Call back number [phone].

## 2021-11-16 ENCOUNTER — Telehealth: Payer: Self-pay | Admitting: Family Medicine

## 2021-11-18 NOTE — Telephone Encounter (Signed)
Meds were approved today, pharmacy will let pt know when they are ready for pick up

## 2021-11-18 NOTE — Telephone Encounter (Signed)
Pt called in to know status of refill . Requesting a call back . [phone]

## 2021-11-25 ENCOUNTER — Telehealth: Payer: Self-pay | Admitting: Family Medicine

## 2021-11-25 MED ORDER — AMLODIPINE BESYLATE 5 MG PO TABS: 5.0000 mg | ORAL_TABLET | Freq: Every day | ORAL | 1 refills | Status: DC

## 2021-11-25 NOTE — Telephone Encounter (Signed)
New Rx sent for pt to [facility] [facility]

## 2021-11-25 NOTE — Telephone Encounter (Signed)
Patient called in stating that she tried to get  rxamLODipine (NORVASC) 10 MG tablet  from [facility] and they informed her that is wasn't authorized. She would like to know why she was taken off of this medication?

## 2022-01-04 ENCOUNTER — Telehealth: Payer: Self-pay | Admitting: Family Medicine

## 2022-01-04 DIAGNOSIS — R7303 Prediabetes: Secondary | ICD-10-CM

## 2022-01-04 DIAGNOSIS — E785 Hyperlipidemia, unspecified: Secondary | ICD-10-CM

## 2022-01-04 NOTE — Telephone Encounter (Signed)
-----   Message from Ellamae Sia sent at 12/21/2021 11:48 AM EDT ----- Regarding: Lab orders for Tuesday, 10.31.23 Lab orders for a 3 month follow up appt.

## 2022-01-05 ENCOUNTER — Other Ambulatory Visit (INDEPENDENT_AMBULATORY_CARE_PROVIDER_SITE_OTHER): Payer: BC Managed Care – PPO

## 2022-01-05 DIAGNOSIS — R7303 Prediabetes: Secondary | ICD-10-CM | POA: Diagnosis not present

## 2022-01-05 DIAGNOSIS — E785 Hyperlipidemia, unspecified: Secondary | ICD-10-CM | POA: Diagnosis not present

## 2022-01-05 LAB — LIPID PANEL
Cholesterol: 198 mg/dL (ref 0–200)
HDL: 61.2 mg/dL (ref >39.00)
LDL Cholesterol: 125 mg/dL — ABNORMAL HIGH (ref 0–99)
NonHDL: 136.81
Total CHOL/HDL Ratio: 3
Triglycerides: 58 mg/dL (ref 0.0–149.0)
VLDL: 11.6 mg/dL (ref 0.0–40.0)

## 2022-01-05 LAB — HEMOGLOBIN A1C: Hgb A1c MFr Bld: 6.7 % — ABNORMAL HIGH (ref 4.6–6.5)

## 2022-01-12 ENCOUNTER — Ambulatory Visit: Payer: Self-pay | Admitting: Family Medicine

## 2022-01-12 ENCOUNTER — Encounter: Payer: Self-pay | Admitting: Family Medicine

## 2022-01-12 VITALS — BP 133/82 | HR 76 | Temp 97.6°F | Ht 67.0 in | Wt 220.1 lb

## 2022-01-12 DIAGNOSIS — E1169 Type 2 diabetes mellitus with other specified complication: Secondary | ICD-10-CM | POA: Diagnosis not present

## 2022-01-12 DIAGNOSIS — E119 Type 2 diabetes mellitus without complications: Secondary | ICD-10-CM | POA: Diagnosis not present

## 2022-01-12 DIAGNOSIS — E785 Hyperlipidemia, unspecified: Secondary | ICD-10-CM

## 2022-01-12 DIAGNOSIS — E538 Deficiency of other specified B group vitamins: Secondary | ICD-10-CM

## 2022-01-12 DIAGNOSIS — E559 Vitamin D deficiency, unspecified: Secondary | ICD-10-CM

## 2022-01-12 DIAGNOSIS — H9202 Otalgia, left ear: Secondary | ICD-10-CM | POA: Insufficient documentation

## 2022-01-12 DIAGNOSIS — I10 Essential (primary) hypertension: Secondary | ICD-10-CM | POA: Diagnosis not present

## 2022-01-12 MED ORDER — ROSUVASTATIN CALCIUM 10 MG PO TABS: 10.0000 mg | ORAL_TABLET | Freq: Every day | ORAL | 3 refills | Status: DC

## 2022-01-12 NOTE — Assessment & Plan Note (Signed)
Lab Results  Component Value Date   HGBA1C 6.7 (H) 01/05/2022   No change after dietary improvement  Will continue to work on it  Declines dm teaching  Declines oral hypoglycemics Will check urien microalb at next labs  Will discuss eye and foot care Fu 3 mo  Rev plan for low glycemic diet and exercise

## 2022-01-12 NOTE — Patient Instructions (Addendum)
Look at options for exercise  Goal of 30 minutes per day  Try to get into the last class at the gym  Get some light weights or exercise bands for home , use your exercise bike if you still have it   Start walking after you move   For cholesterol Minimize beef, fatty pork and fried foods and fatty dairy   Lets start a small dose of generic crestor  If that is not affordable we can change to a different brand (let us know)  10 mg daily  If any side effects or problems let us know   Follow up in 3 months with labs prior    For diabetes: keep working on diet/exercise and weight loss   Try to get most of your carbohydrates from produce (with the exception of white potatoes)  Eat less bread/pasta/rice/snack foods/cereals/sweets and other items from the middle of the grocery store (processed carbs)  For the congestion in your ear go up on nasonex to 2 sprays in each nostril twice daily for 1 week then go back to once daily

## 2022-01-12 NOTE — Assessment & Plan Note (Signed)
bp in fair control at this time  BP Readings from Last 1 Encounters:  01/12/22 133/82   No changes needed Most recent labs reviewed  Disc lifstyle change with low sodium diet and exercise   Plan to continue amlodipine Using less added salt as well and working on wt loss

## 2022-01-12 NOTE — Assessment & Plan Note (Signed)
Now taking 4000 iu daily  Will re check at 3 mo labs More joint problems Disc imp to bone and overall health

## 2022-01-12 NOTE — Progress Notes (Signed)
Subjective:    Patient ID: Crystal Tucker, female    DOB: 05-18-1960, 61 y.o.   MRN: [id number]  HPI Pt presents for f/u of HTN and prediabetes and hyperlipidemia  R ear is bothering her (had vertigo prior)   Wt Readings from Last 3 Encounters:  01/12/22 220 lb 2 oz (99.8 kg)  10/12/21 223 lb 12.8 oz (101.5 kg)  09/24/21 226 lb (102.5 kg)   34.48 kg/m  Working on wt loss   Exercise - less than he wants  Started walking in the gym some but cannot do after dark    HTN bp is stable today  No cp or palpitations or headaches or edema  No side effects to medicines  BP Readings from Last 3 Encounters:  01/12/22 133/82  10/12/21 131/70  09/24/21 (!) 141/80    Amlodipine 5 mg     Hyperlipidemia Lab Results  Component Value Date   CHOL 198 01/05/2022   CHOL 207 (H) 10/05/2021   CHOL 197 07/16/2020   Lab Results  Component Value Date   HDL 61.20 01/05/2022   HDL 57.60 10/05/2021   HDL 58.10 07/16/2020   Lab Results  Component Value Date   LDLCALC 125 (H) 01/05/2022   LDLCALC 132 (H) 10/05/2021   LDLCALC 123 (H) 07/16/2020   Lab Results  Component Value Date   TRIG 58.0 01/05/2022   TRIG 87.0 10/05/2021   TRIG 76.0 07/16/2020   Lab Results  Component Value Date   CHOLHDL 3 01/05/2022   CHOLHDL 4 10/05/2021   CHOLHDL 3 07/16/2020   No results found for: "LDLDIRECT"  Cut back a lot eating everything  Less sodium /less added salt   Beef -once a week or less  Some fatty pork occ  Does eat eggs  [place] foods : some   Peanut butter crackers - a lot    DM2/prediabetes Lab Results  Component Value Date   HGBA1C 6.7 (H) 01/05/2022  No changes    Tried to cut sweets out  Drinking more water  Less pasta   Cannot go to DM teaching due to working at night / long work hours  Her mother went to class and can teach her    Lab Results  Component Value Date   VITAMINB12 223 10/05/2021   D was low also  Taking 2 vit D (4000 iu total)  One B12  500 mcg   Had flu shot Thursday  Severe rxn to covid shot in past- had memory loss  Patient Active Problem List   Diagnosis Date Noted   Right ear pain 01/12/2022   DM (diabetes mellitus), type 2 ([place]) 01/12/2022   Routine general medical examination at a health care facility 10/12/2021   Skin tag 04/27/2021   OA (osteoarthritis) of knee 01/07/2021   Vitamin B12 deficiency 07/29/2020   Vitamin D deficiency 07/29/2020   Colon cancer screening 07/29/2020   Current use of proton pump inhibitor 07/16/2020   Chronic pain of left thumb 04/10/2020   Flatus 10/31/2019   Dizziness 09/24/2019   History of heart surgery 09/24/2019   Hemorrhoids 12/11/2018   Hyperlipidemia associated with type 2 diabetes mellitus ([place]) 11/04/2016   Encounter for screening for HIV 11/03/2016   Encounter for hepatitis C screening test for low risk patient 11/03/2016   Encounter for screening mammogram for breast cancer 11/03/2015   Herpes zoster 10/28/2015   Obesity 11/10/2011   Encounter for general adult medical examination with abnormal findings 11/01/2011   Allergic  rhinitis 07/16/2011   OSA (obstructive sleep apnea) 07/16/2011   DEPENDENT EDEMA, LEGS, BILATERAL 10/11/2008   Essential hypertension 10/26/2006   GERD 10/26/2006   Diverticulosis of large intestine 10/26/2006   COLONIC POLYPS, HX OF 10/26/2006   Past Medical History:  Diagnosis Date   Allergy    Anemia    NOS   Arthritis    Cardiac valve prolapse    leaking valve with surgery 1998   Contact with or exposure to venereal diseases    Diverticulosis of colon (without mention of hemorrhage)    Edema    GERD (gastroesophageal reflux disease)    H/O: pneumonia    Hypertension    Pneumonia    Pruritus of genital organs    Rash and other nonspecific skin eruption    Sleep apnea    Tubular adenoma of colon    Unspecified symptom associated with female genital organs    Past Surgical History:  Procedure Laterality Date   [place]   COLONOSCOPY  7/03   polyps and tics   DIAGNOSTIC LAPAROSCOPY     KNEE ARTHROSCOPY     right   KNEE SURGERY  10/04   LAPAROSCOPY N/A 11/12/2014   Procedure: LAPAROSCOPY DIAGNOSTIC;  Surgeon: Molli Posey, MD;  Location: [facility];  Service: Gynecology;  Laterality: N/A;   PARTIAL HYSTERECTOMY     POLYPECTOMY     RECTAL POLYPECTOMY  8/07   ROTATOR CUFF REPAIR Right    2   SALPINGOOPHORECTOMY Right 11/12/2014   Procedure: SALPINGO OOPHORECTOMY;  Surgeon: Molli Posey, MD;  Location: [facility];  Service: Gynecology;  Laterality: Right;   WRIST SURGERY Right    x 2   Social History   Tobacco Use   Smoking status: Former    Packs/day: 0.80    Years: 30.00    Total pack years: 24.00    Types: Cigarettes    Quit date: 03/01/2002    Years since quitting: 19.8   Smokeless tobacco: Never  Vaping Use   Vaping Use: Never used  Substance Use Topics   Alcohol use: Not Currently   Drug use: No   Family History  Problem Relation Age of Onset   Colon polyps Mother    Diabetes Mother    Hypertension Mother    Diabetes Maternal Aunt    Heart disease Maternal Aunt    Heart disease Maternal Uncle    Cancer Paternal Aunt        type unknown   Cerebral aneurysm Paternal Aunt    Cancer Paternal Aunt        x 2 , types unknown   Heart disease Maternal Grandmother    Cerebral aneurysm Cousin    Colon cancer Neg Hx    Esophageal cancer Neg Hx    Rectal cancer Neg Hx    Stomach cancer Neg Hx    Pancreatic cancer Neg Hx    Breast cancer Neg Hx    Allergies  Allergen Reactions   Lisinopril     cough   Other Rash    All citrus- lemons, oranges, grapefruit, watermelon, cantelope - all fruits with juices    Current Outpatient Medications on File Prior to Visit  Medication Sig Dispense Refill   Acetaminophen (TYLENOL ARTHRITIS EXT RELIEF PO) Take 2 tablets by mouth daily.     albuterol (PROAIR HFA) 108 (90 Base) MCG/ACT inhaler Inhale 2 puffs into the lungs every 6  (six) hours as needed for wheezing or shortness  of breath. 18 g 5   AMBULATORY NON FORMULARY MEDICATION Medication Name: Nitroglycerin ointment 0.125% use pea sized amount three times a day per rectum for 6-8 weeks 30 g 1   amLODipine (NORVASC) 5 MG tablet Take 1 tablet (5 mg total) by mouth daily. 90 tablet 1   cholecalciferol (VITAMIN D3) 25 MCG (1000 UNIT) tablet Take 2,000 Units by mouth daily.     hydrocortisone (ANUSOL-HC) 25 MG suppository Place 1 suppository (25 mg total) rectally at bedtime. 12 suppository 3   methocarbamol (ROBAXIN) 500 MG tablet TAKE 1 TABLET BY MOUTH EVERY 8 HOURS AS NEEDED FOR MUSCLE SPASM (BACK  PAIN)  (WATCH  OUT  FOR  SEDATION) 30 tablet 2   mometasone (NASONEX) 50 MCG/ACT nasal spray Place 2 sprays into the nose daily. 1 each 12   omeprazole (PRILOSEC) 20 MG capsule Take 1 capsule by mouth once daily 90 capsule 0   potassium chloride SA (KLOR-CON M) 20 MEQ tablet Take 1 tablet by mouth once daily 90 tablet 0   vitamin B-12 (CYANOCOBALAMIN) 500 MCG tablet Take 500 mcg by mouth daily.     No current facility-administered medications on file prior to visit.    Review of Systems  Constitutional:  Negative for activity change, appetite change, fatigue, fever and unexpected weight change.  HENT:  Negative for congestion, ear pain, rhinorrhea, sinus pressure and sore throat.   Eyes:  Negative for pain, redness and visual disturbance.  Respiratory:  Negative for cough, shortness of breath and wheezing.   Cardiovascular:  Negative for chest pain and palpitations.  Gastrointestinal:  Negative for abdominal pain, blood in stool, constipation and diarrhea.  Endocrine: Negative for polydipsia and polyuria.  Genitourinary:  Negative for dysuria, frequency and urgency.  Musculoskeletal:  Positive for arthralgias. Negative for back pain and myalgias.  Skin:  Negative for pallor and rash.  Allergic/Immunologic: Negative for environmental allergies.  Neurological:  Negative  for dizziness, syncope and headaches.  Hematological:  Negative for adenopathy. Does not bruise/bleed easily.  Psychiatric/Behavioral:  Negative for decreased concentration and dysphoric mood. The patient is not nervous/anxious.        Objective:   Physical Exam Constitutional:      General: She is not in acute distress.    Appearance: Normal appearance. She is well-developed. She is obese. She is not ill-appearing or diaphoretic.  HENT:     Head: Normocephalic and atraumatic.     Right Ear: Tympanic membrane, ear canal and external ear normal. There is no impacted cerumen.     Left Ear: Tympanic membrane, ear canal and external ear normal. There is no impacted cerumen.     Ears:     Comments: L TM does look more dull than the right  Eyes:     Conjunctiva/sclera: Conjunctivae normal.     Pupils: Pupils are equal, round, and reactive to light.  Neck:     Thyroid: No thyromegaly.     Vascular: No carotid bruit or JVD.  Cardiovascular:     Rate and Rhythm: Normal rate and regular rhythm.     Heart sounds: Normal heart sounds.     No gallop.  Pulmonary:     Effort: Pulmonary effort is normal. No respiratory distress.     Breath sounds: Normal breath sounds. No wheezing or rales.  Abdominal:     General: There is no distension or abdominal bruit.     Palpations: Abdomen is soft.  Musculoskeletal:     Cervical back: Normal range  of motion and neck supple.     Right lower leg: No edema.     Left lower leg: No edema.  Lymphadenopathy:     Cervical: No cervical adenopathy.  Skin:    General: Skin is warm and dry.     Coloration: Skin is not pale.     Findings: No bruising or rash.  Neurological:     Mental Status: She is alert.     Coordination: Coordination normal.     Deep Tendon Reflexes: Reflexes are normal and symmetric. Reflexes normal.  Psychiatric:        Mood and Affect: Mood normal.           Assessment & Plan:   Problem List Items Addressed This Visit        Cardiovascular and Mediastinum   Essential hypertension    bp in fair control at this time  BP Readings from Last 1 Encounters:  01/12/22 133/82  No changes needed Most recent labs reviewed  Disc lifstyle change with low sodium diet and exercise   Plan to continue amlodipine Using less added salt as well and working on wt loss       Relevant Medications   rosuvastatin (CRESTOR) 10 MG tablet     Endocrine   DM (diabetes mellitus), type 2 ([place]) - Primary    Lab Results  Component Value Date   HGBA1C 6.7 (H) 01/05/2022  No change after dietary improvement  Will continue to work on it  Declines dm teaching  Declines oral hypoglycemics Will check urien microalb at next labs  Will discuss eye and foot care Fu 3 mo  Rev plan for low glycemic diet and exercise       Relevant Medications   rosuvastatin (CRESTOR) 10 MG tablet   Hyperlipidemia associated with type 2 diabetes mellitus ([place])    Disc goals for lipids and reasons to control them Rev last labs with pt Rev low sat fat diet in detail Still not at goal for DM2 with LDL of 125 Disc statin for this as standard of care Crestor '10mg'$  daily px and info given F/u 3 mo for visit with fasting lab prior  Will work on cutting red meat and fried foods as well      Relevant Medications   rosuvastatin (CRESTOR) 10 MG tablet     Other   Left ear pain    Nl exam May be ETD with some nasal congestion and occ dizziness Inst to inc nasonex to bid for 1 week and then return to daily  Update if not starting to improve in a week or if worsening        RESOLVED: Prediabetes   Vitamin B12 deficiency    Lab Results  Component Value Date   VITAMINB12 223 10/05/2021  Now taking 500 mcg daily  Re check at 3 mo lab      Vitamin D deficiency    Now taking 4000 iu daily  Will re check at 3 mo labs More joint problems Disc imp to bone and overall health

## 2022-01-12 NOTE — Assessment & Plan Note (Signed)
Lab Results  Component Value Date   VITAMINB12 223 10/05/2021   Now taking 500 mcg daily  Re check at 3 mo lab

## 2022-01-12 NOTE — Assessment & Plan Note (Signed)
Disc goals for lipids and reasons to control them Rev last labs with pt Rev low sat fat diet in detail Still not at goal for DM2 with LDL of 125 Disc statin for this as standard of care Crestor '10mg'$  daily px and info given F/u 3 mo for visit with fasting lab prior  Will work on cutting red meat and fried foods as well

## 2022-01-12 NOTE — Assessment & Plan Note (Signed)
Nl exam May be ETD with some nasal congestion and occ dizziness Inst to inc nasonex to bid for 1 week and then return to daily  Update if not starting to improve in a week or if worsening

## 2022-02-06 ENCOUNTER — Other Ambulatory Visit: Payer: Self-pay | Admitting: Family Medicine

## 2022-02-19 ENCOUNTER — Other Ambulatory Visit: Payer: Self-pay | Admitting: Family Medicine

## 2022-03-08 IMAGING — CR DG FOOT COMPLETE 3+V*R*
2 series · 2 of 2 positions shown · non-contrast
Comparison: None.

CLINICAL DATA: Status post fall.

EXAM:
RIGHT FOOT COMPLETE - 3+ VIEW

[x foot lat right]
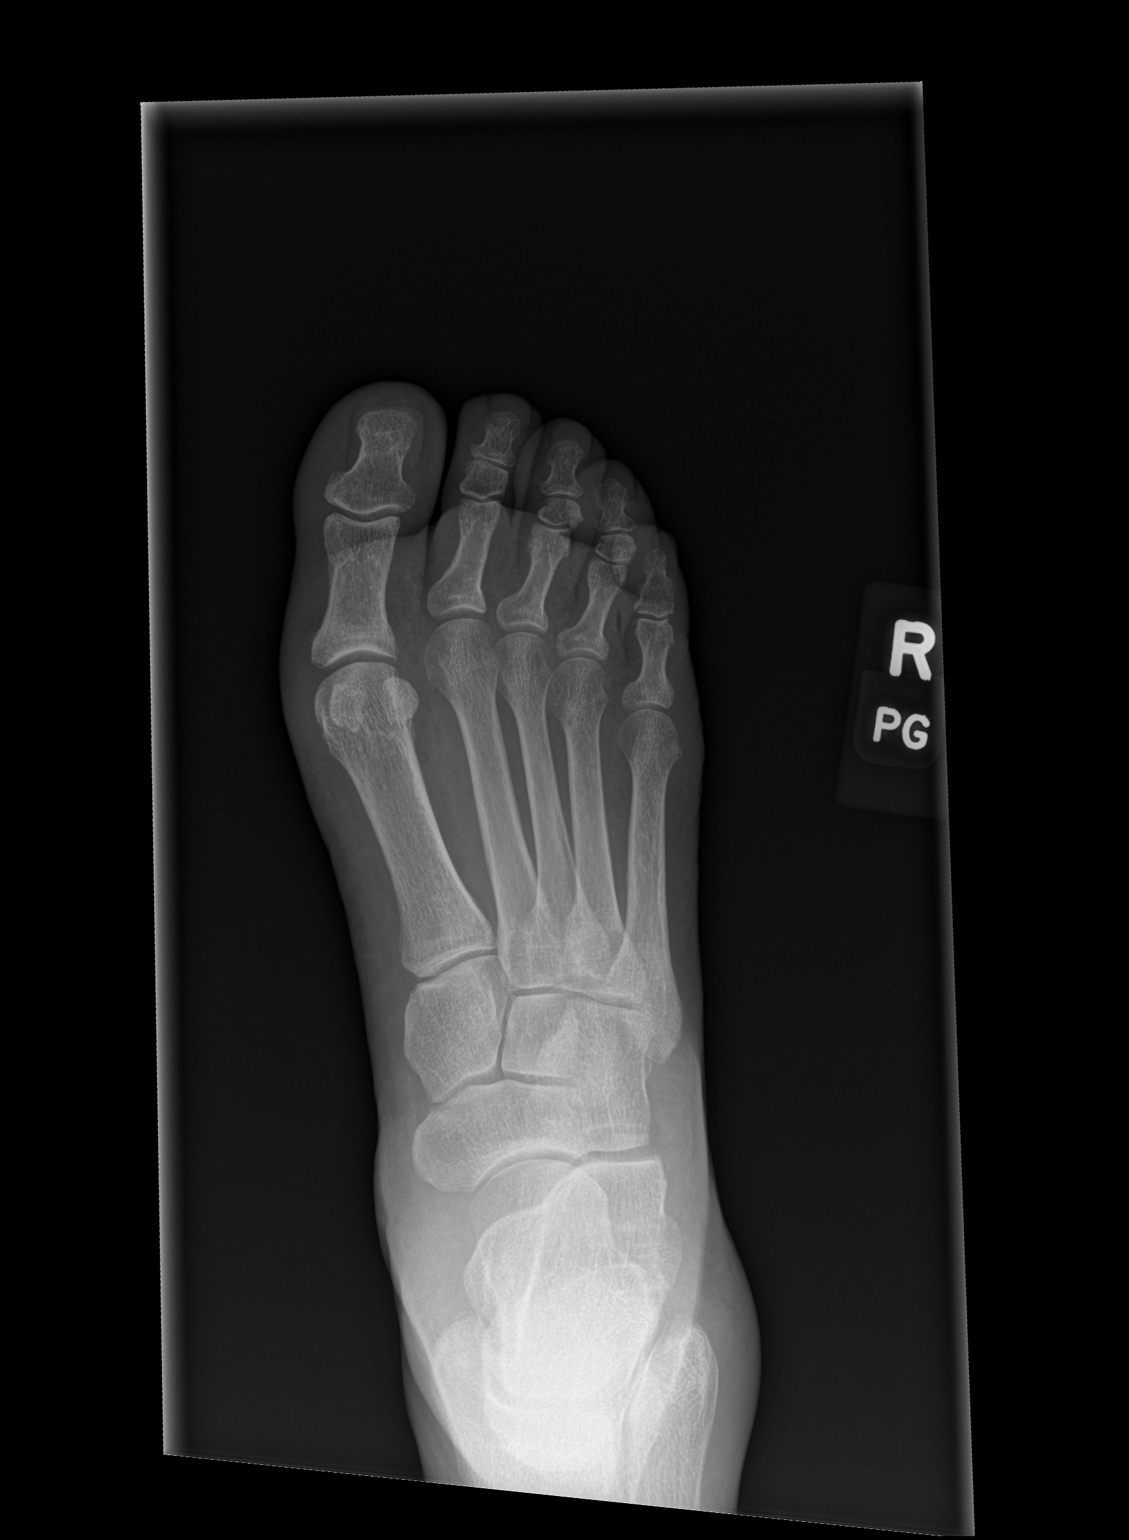

[x foot obl right]
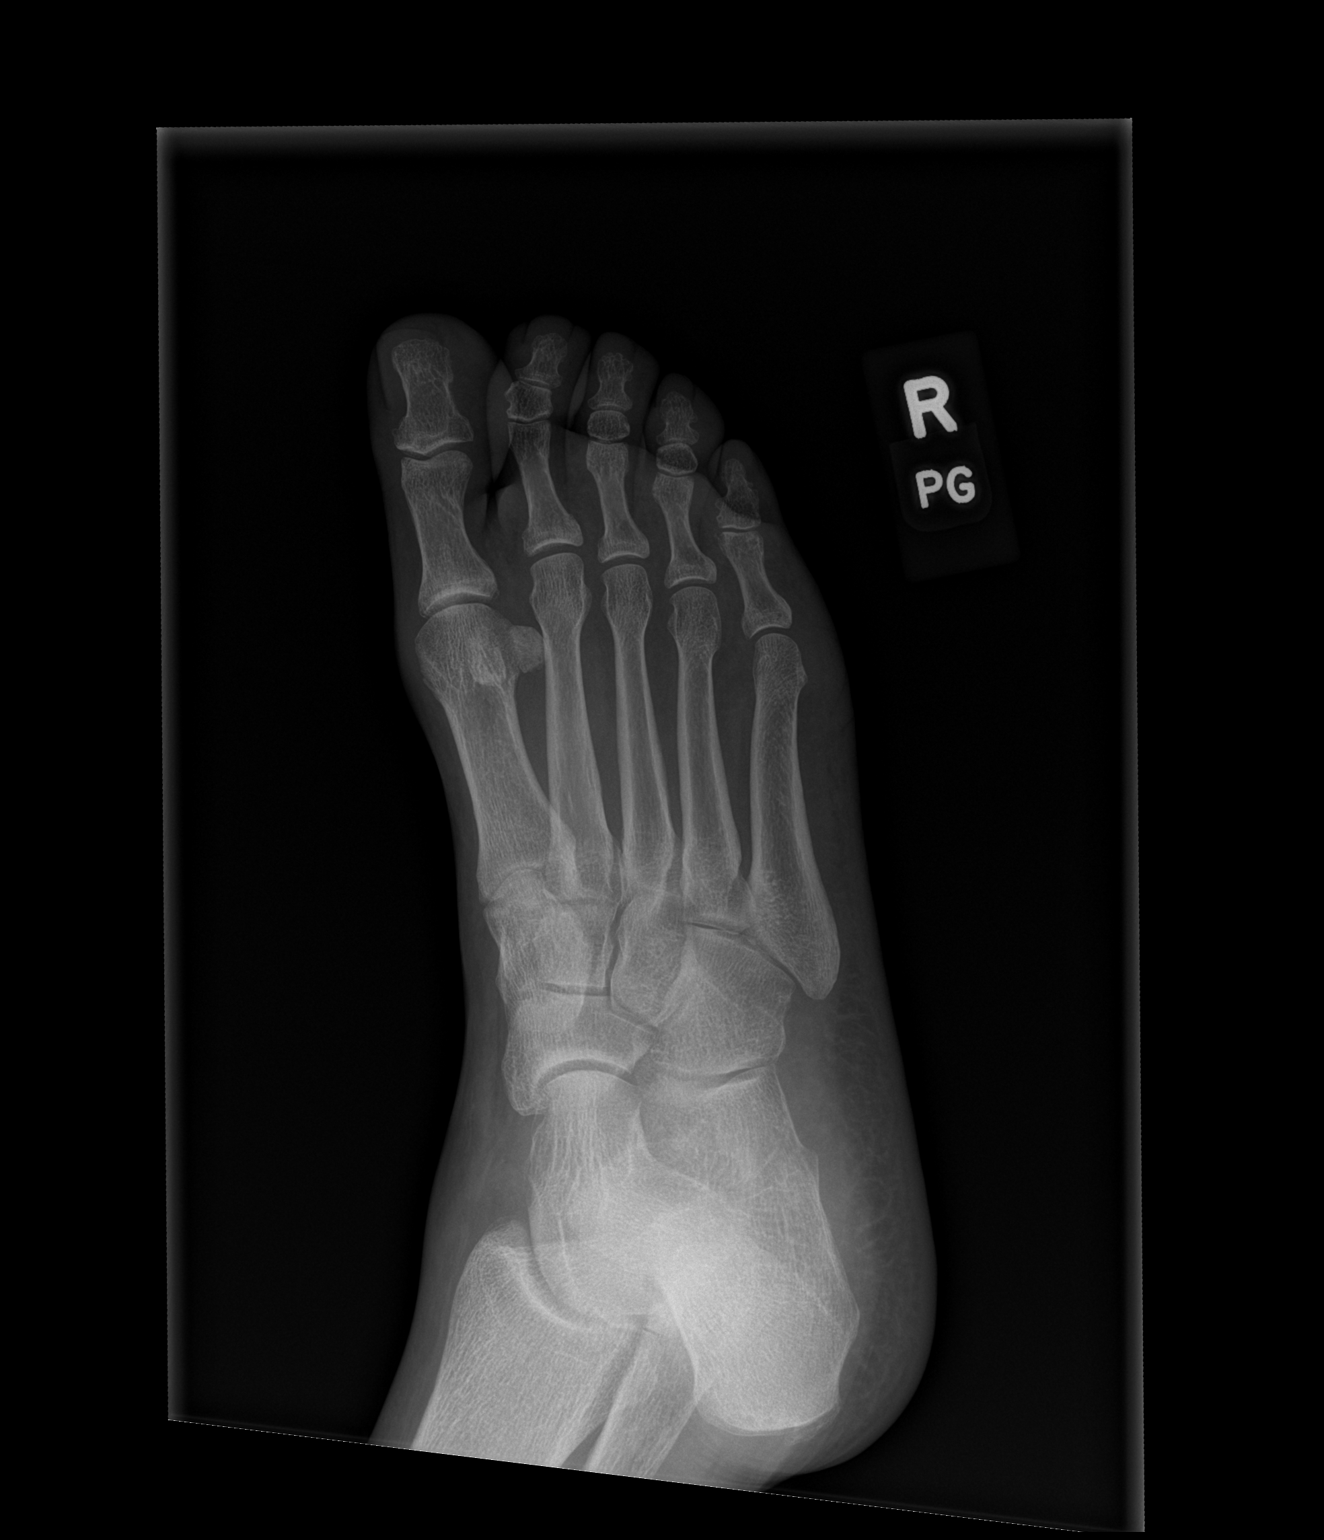

[2 of 2 positions shown; findings below may reference images not displayed]

FINDINGS: There is no evidence of fracture or dislocation. There is no
evidence of arthropathy or other focal bone abnormality. Soft
tissues are unremarkable.
IMPRESSION: Negative.

## 2022-03-08 IMAGING — CR DG ANKLE COMPLETE 3+V*R*
3 series · 3 of 3 positions shown · non-contrast
Comparison: None.

CLINICAL DATA: Status post fall.

EXAM:
RIGHT ANKLE - COMPLETE 3+ VIEW

[x ankle obl right]
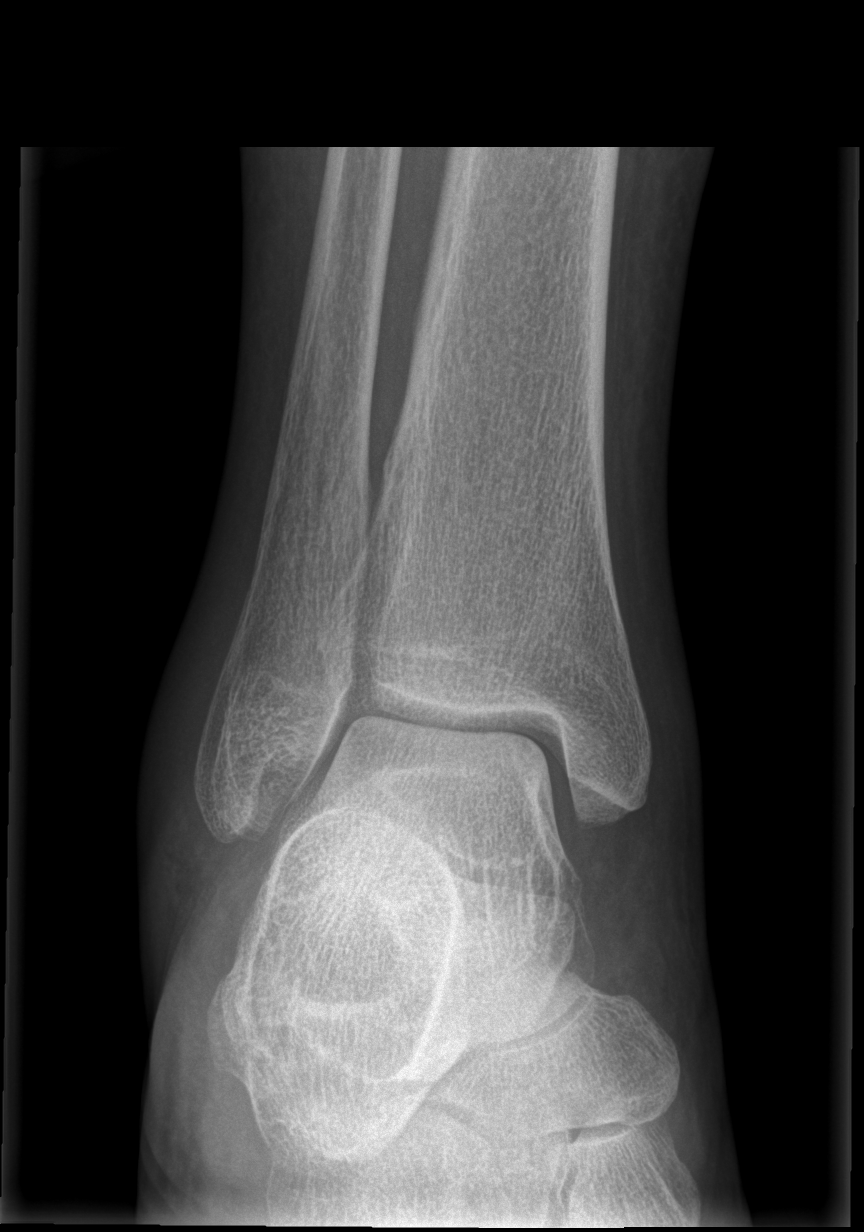

[x ankle lat right (1 of 2)]
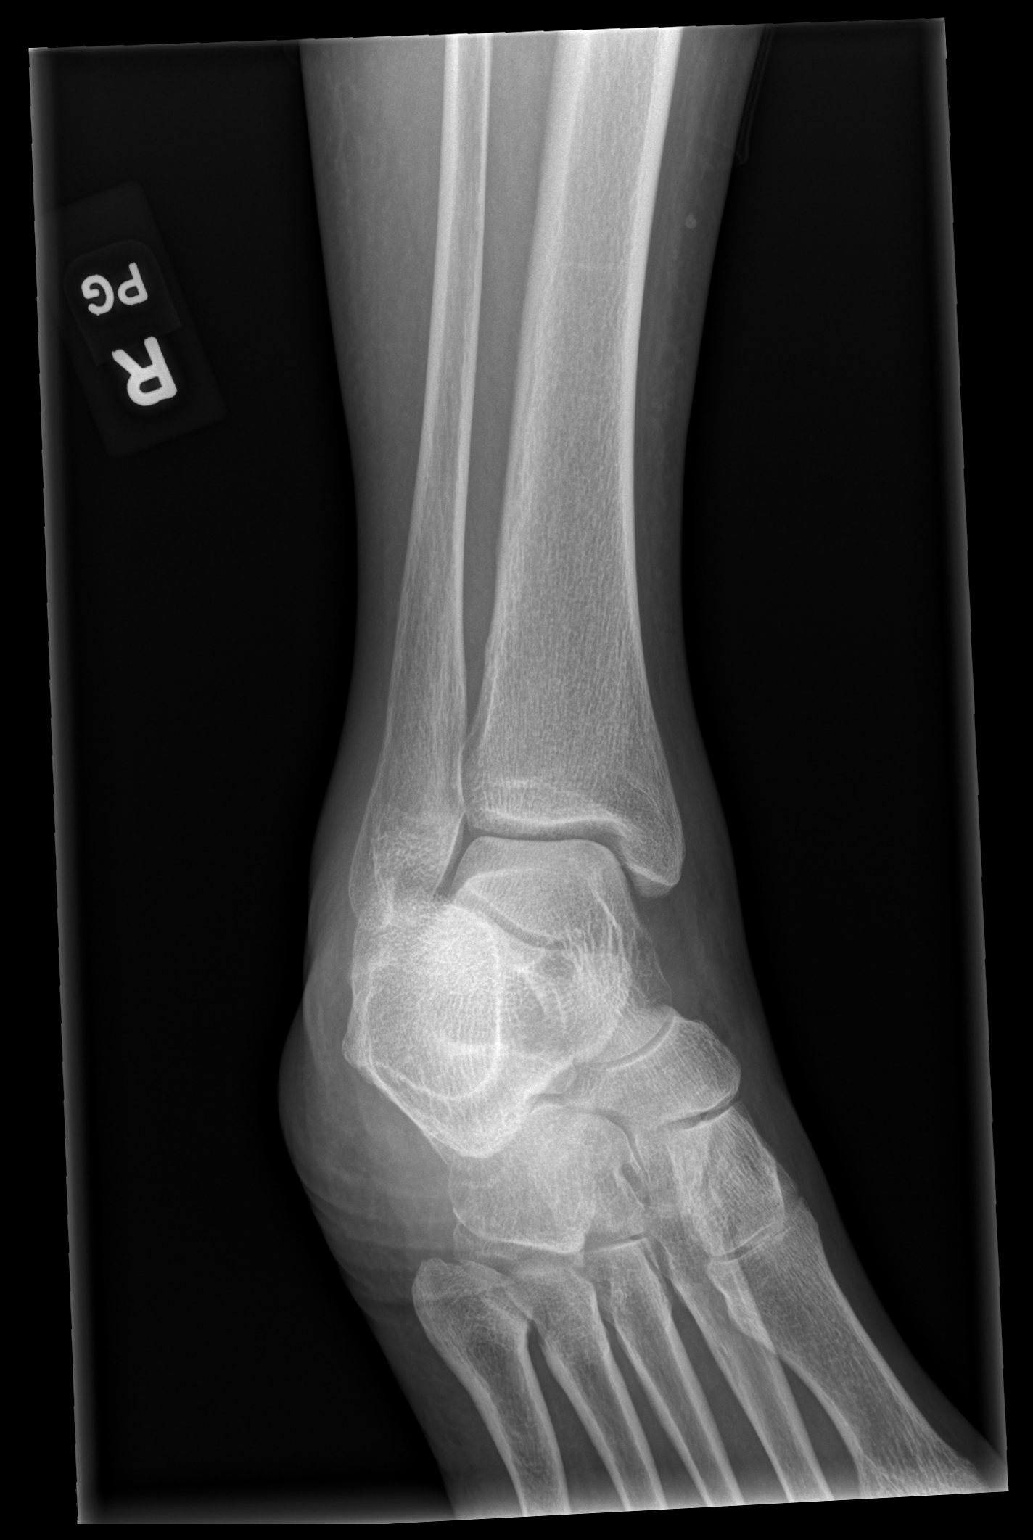

[x ankle lat right (2 of 2)]
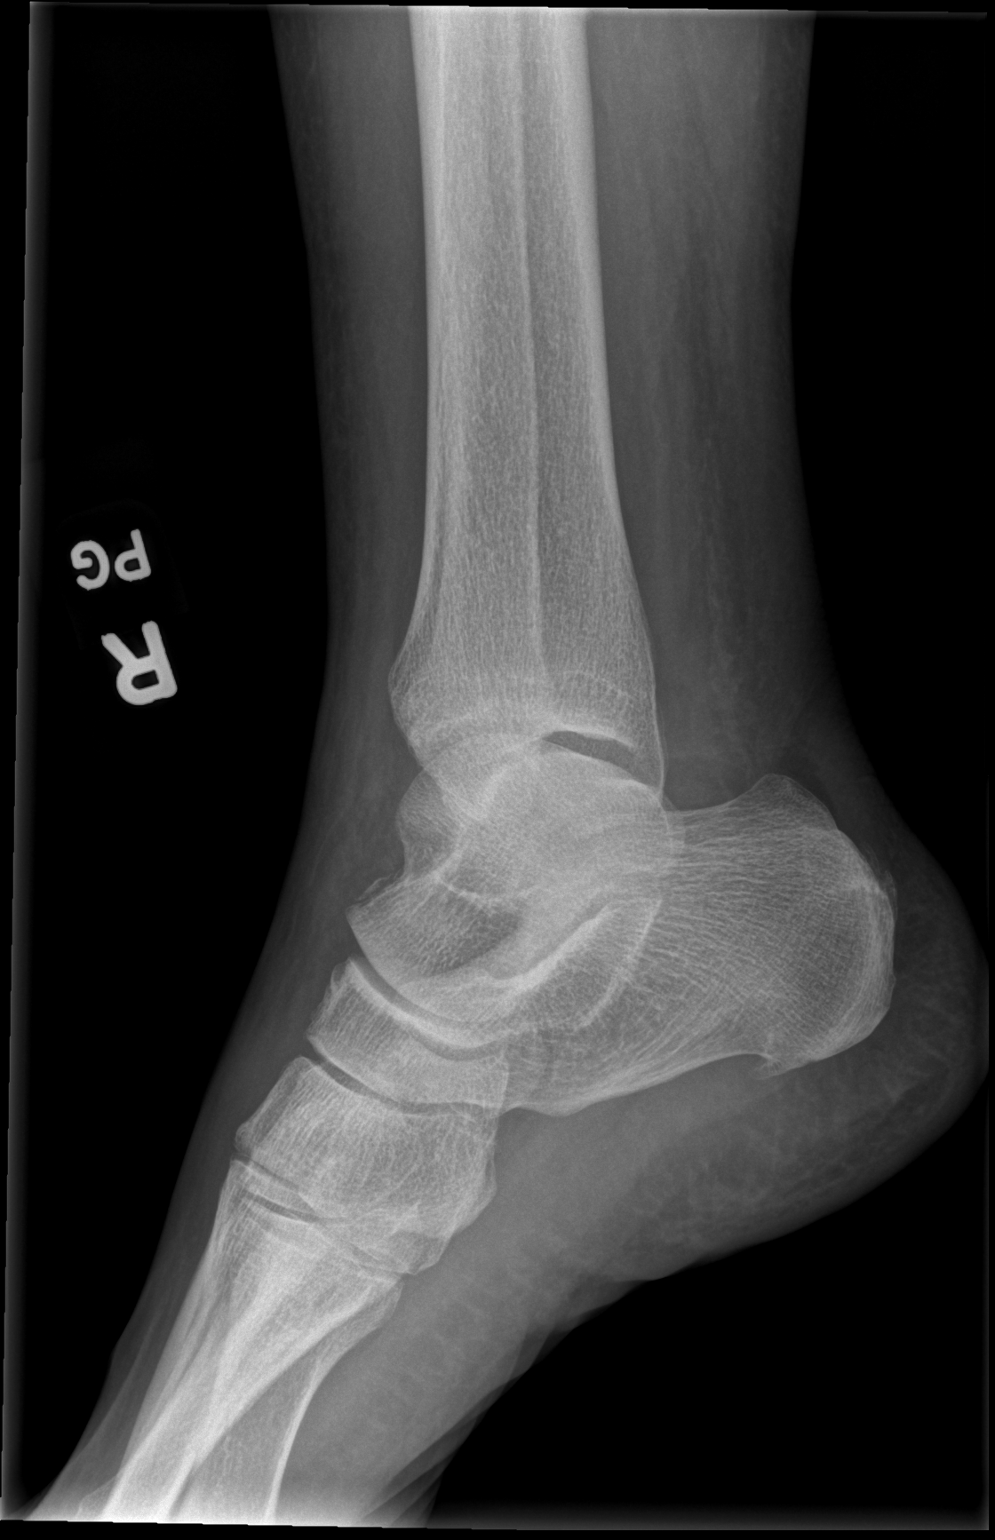

[3 of 3 positions shown; findings below may reference images not displayed]

FINDINGS: There is no evidence of fracture, dislocation, or joint effusion.
There is no evidence of arthropathy or other focal bone abnormality.
There is mild diffuse soft tissue swelling.
IMPRESSION: 1. No acute osseous abnormality.
2. Mild diffuse soft tissue swelling.

## 2022-03-09 ENCOUNTER — Ambulatory Visit: Admit: 2022-03-09 | Discharge status: Absent without leave | Payer: Self-pay

## 2022-03-26 ENCOUNTER — Encounter: Payer: Self-pay | Admitting: Nurse Practitioner

## 2022-03-26 ENCOUNTER — Ambulatory Visit: Payer: No Typology Code available for payment source | Admitting: Nurse Practitioner

## 2022-03-26 VITALS — BP 138/78 | HR 88 | Ht 67.0 in | Wt 216.0 lb

## 2022-03-26 DIAGNOSIS — U071 COVID-19: Secondary | ICD-10-CM | POA: Diagnosis not present

## 2022-03-26 MED ORDER — NIRMATRELVIR/RITONAVIR (PAXLOVID)TABLET: 3.0000 | ORAL_TABLET | Freq: Two times a day (BID) | ORAL | 0 refills | Status: AC

## 2022-03-26 NOTE — Progress Notes (Signed)
Acute Office Visit  Subjective:     Patient ID: Crystal Tucker, female    DOB: 06/12/1960, 62 y.o.   MRN: [id number]  Chief Complaint  Patient presents with   Covid Positive    Tested positive today   Nasal Congestion    X5d with loss of taste/smell   Cough    X5d; denies f/n/v/shob     Patient is in today for sick symptoms and covid positive with a history of hypertension, OSA, hyperlipidemia, diabetes mellitus type 2.  Of note patient was a former smoker quit in 2003  Symptoms started on 03/21/2022. States it got worse on Monday  Tested positive for COVID today 03/26/2022 COVID-vaccine: Moderna x 2 Flu vaccine: Up-to-date 01/07/2022. States that she take tylenol arthritis daily  Nasonex also   Review of Systems  Constitutional:  Positive for chills and malaise/fatigue. Negative for fever.  HENT:  Positive for congestion. Negative for ear discharge, ear pain, sinus pain and sore throat.   Respiratory:  Positive for cough. Negative for sputum production and shortness of breath.   Gastrointestinal:  Negative for abdominal pain, constipation, diarrhea, nausea and vomiting.  Musculoskeletal:  Negative for joint pain and myalgias.  Neurological:  Negative for headaches.        Objective:    BP 138/78   Pulse 88   Ht '5\' 7"'$  (1.702 m)   Wt 216 lb (98 kg)   SpO2 96%   BMI 33.83 kg/m    Physical Exam Vitals and nursing note reviewed.  Constitutional:      Appearance: Normal appearance.  HENT:     Right Ear: Tympanic membrane, ear canal and external ear normal.     Left Ear: Ear canal and external ear normal.     Ears:     Comments: Fluid behind TM    Mouth/Throat:     Mouth: Mucous membranes are moist.     Pharynx: Oropharynx is clear.  Cardiovascular:     Rate and Rhythm: Normal rate and regular rhythm.     Heart sounds: Normal heart sounds.  Pulmonary:     Effort: Pulmonary effort is normal.     Breath sounds: Normal breath sounds.  Abdominal:      General: Bowel sounds are normal.  Lymphadenopathy:     Cervical: No cervical adenopathy.  Neurological:     Mental Status: She is alert.     No results found for any visits on 03/26/22.      Assessment & Plan:   Problem List Items Addressed This Visit       Other   COVID-19 - Primary    Covid 19 positive at home. Discussed antivirals and that they are EUA only. After a joint discussion elect to treat with paxlovid. Discussed CDC guidelines/recommendations in regards to self isolation/quarantine. S/S discussed when to seek urgent or emergent health care. Common side effects of the medication discussed. Follow up if no improvement      Relevant Medications   nirmatrelvir/ritonavir (PAXLOVID) 20 x 150 MG & 10 x '100MG'$  TABS    Meds ordered this encounter  Medications   nirmatrelvir/ritonavir (PAXLOVID) 20 x 150 MG & 10 x '100MG'$  TABS    Sig: Take 3 tablets by mouth 2 (two) times daily for 5 days. (Take nirmatrelvir 150 mg two tablets twice daily for 5 days and ritonavir 100 mg one tablet twice daily for 5 days) Patient GFR is 83    Dispense:  30 tablet  Refill:  0    Order Specific Question:   Supervising Provider    Answer:   Loura Pardon A [[id number]    Return if symptoms worsen or fail to improve.  Romilda Garret, NP

## 2022-03-26 NOTE — Assessment & Plan Note (Signed)
Covid 19 positive at home. Discussed antivirals and that they are EUA only. After a joint discussion elect to treat with paxlovid. Discussed CDC guidelines/recommendations in regards to self isolation/quarantine. S/S discussed when to seek urgent or emergent health care. Common side effects of the medication discussed. Follow up if no improvement

## 2022-03-26 NOTE — Patient Instructions (Signed)
Nice to see you today Hold the Rosuvastatin (crestor) for 2 weeks.  You need to quarantine through 03/27/2022. If you are fever free for 24 hours with out the use of fever reducing medication you can return to work on 03/28/2022 but need to wear a mask for 5 days.

## 2022-04-07 ENCOUNTER — Other Ambulatory Visit (INDEPENDENT_AMBULATORY_CARE_PROVIDER_SITE_OTHER): Payer: No Typology Code available for payment source

## 2022-04-07 DIAGNOSIS — E538 Deficiency of other specified B group vitamins: Secondary | ICD-10-CM | POA: Diagnosis not present

## 2022-04-07 DIAGNOSIS — I10 Essential (primary) hypertension: Secondary | ICD-10-CM | POA: Diagnosis not present

## 2022-04-07 DIAGNOSIS — E119 Type 2 diabetes mellitus without complications: Secondary | ICD-10-CM

## 2022-04-07 DIAGNOSIS — E559 Vitamin D deficiency, unspecified: Secondary | ICD-10-CM

## 2022-04-07 DIAGNOSIS — E1169 Type 2 diabetes mellitus with other specified complication: Secondary | ICD-10-CM | POA: Diagnosis not present

## 2022-04-07 DIAGNOSIS — E785 Hyperlipidemia, unspecified: Secondary | ICD-10-CM

## 2022-04-07 LAB — HEMOGLOBIN A1C: Hgb A1c MFr Bld: 6.9 % — ABNORMAL HIGH (ref 4.6–6.5)

## 2022-04-07 LAB — COMPREHENSIVE METABOLIC PANEL
ALT: 24 U/L (ref 0–35)
AST: 15 U/L (ref 0–37)
Albumin: 4 g/dL (ref 3.5–5.2)
Alkaline Phosphatase: 53 U/L (ref 39–117)
BUN: 13 mg/dL (ref 6–23)
CO2: 28 mEq/L (ref 19–32)
Calcium: 9 mg/dL (ref 8.4–10.5)
Chloride: 104 mEq/L (ref 96–112)
Creatinine, Ser: 0.71 mg/dL (ref 0.40–1.20)
GFR: 91.39 mL/min (ref >60.00)
Glucose, Bld: 104 mg/dL — ABNORMAL HIGH (ref 70–99)
Potassium: 4.3 mEq/L (ref 3.5–5.1)
Sodium: 141 mEq/L (ref 135–145)
Total Bilirubin: 0.4 mg/dL (ref 0.2–1.2)
Total Protein: 6.6 g/dL (ref 6.0–8.3)

## 2022-04-07 LAB — LIPID PANEL
Cholesterol: 186 mg/dL (ref 0–200)
HDL: 51.1 mg/dL (ref >39.00)
LDL Cholesterol: 118 mg/dL — ABNORMAL HIGH (ref 0–99)
NonHDL: 135.28
Total CHOL/HDL Ratio: 4
Triglycerides: 85 mg/dL (ref 0.0–149.0)
VLDL: 17 mg/dL (ref 0.0–40.0)

## 2022-04-07 LAB — VITAMIN B12: Vitamin B-12: 747 pg/mL (ref 211–911)

## 2022-04-07 LAB — VITAMIN D 25 HYDROXY (VIT D DEFICIENCY, FRACTURES): VITD: 32.47 ng/mL (ref 30.00–100.00)

## 2022-04-14 ENCOUNTER — Encounter: Payer: Self-pay | Admitting: Family Medicine

## 2022-04-14 ENCOUNTER — Ambulatory Visit: Payer: No Typology Code available for payment source | Admitting: Family Medicine

## 2022-04-14 VITALS — BP 136/78 | HR 65 | Temp 97.6°F | Ht 67.0 in | Wt 218.2 lb

## 2022-04-14 DIAGNOSIS — E538 Deficiency of other specified B group vitamins: Secondary | ICD-10-CM | POA: Diagnosis not present

## 2022-04-14 DIAGNOSIS — E119 Type 2 diabetes mellitus without complications: Secondary | ICD-10-CM

## 2022-04-14 DIAGNOSIS — E785 Hyperlipidemia, unspecified: Secondary | ICD-10-CM

## 2022-04-14 DIAGNOSIS — I10 Essential (primary) hypertension: Secondary | ICD-10-CM | POA: Diagnosis not present

## 2022-04-14 DIAGNOSIS — E1169 Type 2 diabetes mellitus with other specified complication: Secondary | ICD-10-CM | POA: Diagnosis not present

## 2022-04-14 DIAGNOSIS — E559 Vitamin D deficiency, unspecified: Secondary | ICD-10-CM

## 2022-04-14 DIAGNOSIS — Z6835 Body mass index (BMI) 35.0-35.9, adult: Secondary | ICD-10-CM

## 2022-04-14 MED ORDER — METFORMIN HCL ER 500 MG PO TB24: 500.0000 mg | ORAL_TABLET | Freq: Every day | ORAL | 3 refills | Status: DC

## 2022-04-14 NOTE — Assessment & Plan Note (Signed)
bp in fair control at this time  BP Readings from Last 1 Encounters:  04/14/22 136/78   No changes needed Most recent labs reviewed  Disc lifstyle change with low sodium diet and exercise   Plan to continue amlodipine 5 mg daily Using less added salt as well and working on wt loss

## 2022-04-14 NOTE — Assessment & Plan Note (Signed)
Discussed how this problem influences overall health and the risks it imposes  Reviewed plan for weight loss with lower calorie diet (via better food choices and also portion control or program like weight watchers) and exercise building up to or more than 30 minutes 5 days per week including some aerobic activity    

## 2022-04-14 NOTE — Patient Instructions (Addendum)
You need a diabetes eye exam  Call and make an appt when you find out who is covered  Set it up and have them send [place] a copy    Drink lots of water If any urinary symptoms return let us know   Keep working on the low glycemic diet and exercise   Try to get most of your carbohydrates from produce (with the exception of white potatoes)  Eat less bread/pasta/rice/snack foods/cereals/sweets and other items from the middle of the grocery store (processed carbs)   Continue your vitamin B12 and vitamin D  Start some metformin xr 500 mg daily  If any side effects let us know   Get back on track with the rosuvastatin   Follow up in 3 months with fasting labs prior

## 2022-04-14 NOTE — Assessment & Plan Note (Signed)
Lab Results  Component Value Date   [id number] 04/07/2022   Better on vit B12 500 mcg daily

## 2022-04-14 NOTE — Assessment & Plan Note (Signed)
Lab Results  Component Value Date   HGBA1C 6.9 (H) 04/07/2022   Pt had set back with diet /exercise after 2 back to back illnesses Is just getting back to normal with appetite and ability to exercise  Disc low glycemic diet  Pt plans to schedule eye exam  Taking statin  Px metformin xr 500 mg daily -low dose to start F/u 3 mo with lab prior

## 2022-04-14 NOTE — Progress Notes (Signed)
Subjective:    Patient ID: Crystal Tucker, female    DOB: Feb 02, 1961, 62 y.o.   MRN: [id number]  HPI Pt presents for f/u of DM2 and HTN and chronic medical problems   Wt Readings from Last 3 Encounters:  04/14/22 218 lb 4 oz (99 kg)  03/26/22 216 lb (98 kg)  01/12/22 220 lb 2 oz (99.8 kg)   34.18 kg/m  Vitals:   04/14/22 1530  BP: 136/78  Pulse: 65  Temp: 97.6 F (36.4 C)  SpO2: 97%   Had a uti  [facility] Still feels a little tingle at the end of urination   Had covid for the first time  Wasn't too bad  Thought she had a head cold  Did take paxlovid  Still a little congestion  Never had much of a cough   Taking care of herself  Working on weight loss  Her eating is off track right now   (was bad when she had covid) Craves toast with honey on it  Some fruit   Eating less than usual     HTN bp is stable today  No cp or palpitations or headaches or edema  No side effects to medicines  BP Readings from Last 3 Encounters:  04/14/22 136/78  03/26/22 138/78  01/12/22 133/82     Amlodipine 5 mg daily  DM2 Lab Results  Component Value Date   HGBA1C 6.9 (H) 04/07/2022   This is up from 6.7 Had 2 infections back to back and was not eating right   Lab Results  Component Value Date   MICROALBUR 2.0 (H) 10/12/2021   Eye exam -needs to schedule   Walking for exercise   Quit her 2nd job-it was too stressful  That gives her time for self     Hyperlipidemia Lab Results  Component Value Date   CHOL 186 04/07/2022   CHOL 198 01/05/2022   CHOL 207 (H) 10/05/2021   Lab Results  Component Value Date   HDL 51.10 04/07/2022   HDL 61.20 01/05/2022   HDL 57.60 10/05/2021   Lab Results  Component Value Date   LDLCALC 118 (H) 04/07/2022   LDLCALC 125 (H) 01/05/2022   LDLCALC 132 (H) 10/05/2021   Lab Results  Component Value Date   TRIG 85.0 04/07/2022   TRIG 58.0 01/05/2022   TRIG 87.0 10/05/2021   Lab Results  Component Value Date    CHOLHDL 4 04/07/2022   CHOLHDL 3 01/05/2022   CHOLHDL 4 10/05/2021   No results found for: "LDLDIRECT" Last visit we px crestor 10 mg - had to stop when she was on paxlovid  Will get back on it   Vit b12 def Lab Results  Component Value Date   VITAMINB12 747 04/07/2022   This is up from 223 Taking 500 mcg daily   Vit D def Level of 32.4  Patient Active Problem List   Diagnosis Date Noted   COVID-19 03/26/2022   Left ear pain 01/12/2022   DM (diabetes mellitus), type 2 ([place]) 01/12/2022   Routine general medical examination at a health care facility 10/12/2021   Skin tag 04/27/2021   OA (osteoarthritis) of knee 01/07/2021   Vitamin B12 deficiency 07/29/2020   Vitamin D deficiency 07/29/2020   Colon cancer screening 07/29/2020   Current use of proton pump inhibitor 07/16/2020   Chronic pain of left thumb 04/10/2020   Flatus 10/31/2019   Dizziness 09/24/2019   History of heart surgery  09/24/2019   Pain of left hip joint 02/22/2019   Hemorrhoids 12/11/2018   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 07/05/2017   Hyperlipidemia associated with type 2 diabetes mellitus ([place]) 11/04/2016   Encounter for screening for HIV 11/03/2016   Encounter for hepatitis C screening test for low risk patient 11/03/2016   Encounter for screening mammogram for breast cancer 11/03/2015   Herpes zoster 10/28/2015   Obesity 11/10/2011   Encounter for general adult medical examination with abnormal findings 11/01/2011   Allergic rhinitis 07/16/2011   OSA (obstructive sleep apnea) 07/16/2011   DEPENDENT EDEMA, LEGS, BILATERAL 10/11/2008   Essential hypertension 10/26/2006   GERD 10/26/2006   Diverticulosis of large intestine 10/26/2006   COLONIC POLYPS, HX OF 10/26/2006   Past Medical History:  Diagnosis Date   Allergy    Anemia    NOS   Arthritis    Cardiac valve prolapse    leaking valve with surgery 1998   Contact with or exposure to venereal diseases     Diverticulosis of colon (without mention of hemorrhage)    Edema    GERD (gastroesophageal reflux disease)    H/O: pneumonia    Hypertension    Pneumonia    Pruritus of genital organs    Rash and other nonspecific skin eruption    Sleep apnea    Tubular adenoma of colon    Unspecified symptom associated with female genital organs    Past Surgical History:  Procedure Laterality Date   [place]   COLONOSCOPY  7/03   polyps and tics   DIAGNOSTIC LAPAROSCOPY     KNEE ARTHROSCOPY     right   KNEE SURGERY  10/04   LAPAROSCOPY N/A 11/12/2014   Procedure: LAPAROSCOPY DIAGNOSTIC;  Surgeon: Molli Posey, MD;  Location: [facility];  Service: Gynecology;  Laterality: N/A;   PARTIAL HYSTERECTOMY     POLYPECTOMY     RECTAL POLYPECTOMY  8/07   ROTATOR CUFF REPAIR Right    2   SALPINGOOPHORECTOMY Right 11/12/2014   Procedure: SALPINGO OOPHORECTOMY;  Surgeon: Molli Posey, MD;  Location: [facility];  Service: Gynecology;  Laterality: Right;   WRIST SURGERY Right    x 2   Social History   Tobacco Use   Smoking status: Former    Packs/day: 0.80    Years: 30.00    Total pack years: 24.00    Types: Cigarettes    Quit date: 03/01/2002    Years since quitting: 20.1   Smokeless tobacco: Never  Vaping Use   Vaping Use: Never used  Substance Use Topics   Alcohol use: Not Currently   Drug use: No   Family History  Problem Relation Age of Onset   Colon polyps Mother    Diabetes Mother    Hypertension Mother    Diabetes Maternal Aunt    Heart disease Maternal Aunt    Heart disease Maternal Uncle    Cancer Paternal Aunt        type unknown   Cerebral aneurysm Paternal Aunt    Cancer Paternal Aunt        x 2 , types unknown   Heart disease Maternal Grandmother    Cerebral aneurysm Cousin    Colon cancer Neg Hx    Esophageal cancer Neg Hx    Rectal cancer Neg Hx    Stomach cancer Neg Hx    Pancreatic cancer Neg Hx    Breast cancer Neg Hx  Allergies  Allergen  Reactions   Lisinopril     cough   Citrus Rash   Current Outpatient Medications on File Prior to Visit  Medication Sig Dispense Refill   Acetaminophen (TYLENOL ARTHRITIS EXT RELIEF PO) Take 2 tablets by mouth daily.     albuterol (PROAIR HFA) 108 (90 Base) MCG/ACT inhaler Inhale 2 puffs into the lungs every 6 (six) hours as needed for wheezing or shortness of breath. 18 g 5   AMBULATORY NON FORMULARY MEDICATION Medication Name: Nitroglycerin ointment 0.125% use pea sized amount three times a day per rectum for 6-8 weeks 30 g 1   amLODipine (NORVASC) 5 MG tablet Take 1 tablet (5 mg total) by mouth daily. 90 tablet 1   cholecalciferol (VITAMIN D3) 25 MCG (1000 UNIT) tablet Take 2,000 Units by mouth daily.     hydrocortisone (ANUSOL-HC) 25 MG suppository Place 1 suppository (25 mg total) rectally at bedtime. 12 suppository 3   methocarbamol (ROBAXIN) 500 MG tablet TAKE 1 TABLET BY MOUTH EVERY 8 HOURS AS NEEDED FOR MUSCLE SPASM (BACK  PAIN)  (WATCH  OUT  FOR  SEDATION) 30 tablet 2   mometasone (NASONEX) 50 MCG/ACT nasal spray Place 2 sprays into the nose daily. 1 each 12   omeprazole (PRILOSEC) 20 MG capsule Take 1 capsule by mouth once daily 90 capsule 1   potassium chloride SA (KLOR-CON M20) 20 MEQ tablet Take 1 tablet by mouth once daily 90 tablet 1   rosuvastatin (CRESTOR) 10 MG tablet Take 1 tablet (10 mg total) by mouth daily. 90 tablet 3   vitamin B-12 (CYANOCOBALAMIN) 500 MCG tablet Take 500 mcg by mouth daily.     No current facility-administered medications on file prior to visit.     Review of Systems  Constitutional:  Positive for activity change, appetite change and fatigue. Negative for fever and unexpected weight change.  HENT:  Negative for congestion, ear pain, rhinorrhea, sinus pressure and sore throat.   Eyes:  Negative for pain, redness and visual disturbance.  Respiratory:  Positive for cough. Negative for shortness of breath and wheezing.   Cardiovascular:  Negative for  chest pain and palpitations.  Gastrointestinal:  Negative for abdominal pain, blood in stool, constipation and diarrhea.  Endocrine: Negative for polydipsia and polyuria.  Genitourinary:  Negative for dysuria, frequency and urgency.       Dysuria is improving  Musculoskeletal:  Negative for arthralgias, back pain and myalgias.  Skin:  Negative for pallor and rash.  Allergic/Immunologic: Negative for environmental allergies.  Neurological:  Negative for dizziness, syncope and headaches.  Hematological:  Negative for adenopathy. Does not bruise/bleed easily.  Psychiatric/Behavioral:  Negative for decreased concentration and dysphoric mood. The patient is not nervous/anxious.        Objective:   Physical Exam Constitutional:      General: She is not in acute distress.    Appearance: Normal appearance. She is well-developed. She is obese. She is not ill-appearing or diaphoretic.  HENT:     Head: Normocephalic and atraumatic.  Eyes:     General: No scleral icterus.    Conjunctiva/sclera: Conjunctivae normal.     Pupils: Pupils are equal, round, and reactive to light.  Neck:     Thyroid: No thyromegaly.     Vascular: No carotid bruit or JVD.  Cardiovascular:     Rate and Rhythm: Normal rate and regular rhythm.     Heart sounds: Murmur heard.     No gallop.  Pulmonary:  Effort: Pulmonary effort is normal. No respiratory distress.     Breath sounds: Normal breath sounds. No wheezing or rales.  Abdominal:     General: There is no distension or abdominal bruit.     Palpations: Abdomen is soft.  Musculoskeletal:     Cervical back: Normal range of motion and neck supple.     Right lower leg: No edema.     Left lower leg: No edema.  Lymphadenopathy:     Cervical: No cervical adenopathy.  Skin:    General: Skin is warm and dry.     Coloration: Skin is not pale.     Findings: No rash.  Neurological:     Mental Status: She is alert.     Coordination: Coordination normal.      Deep Tendon Reflexes: Reflexes are normal and symmetric. Reflexes normal.  Psychiatric:        Mood and Affect: Mood normal.           Assessment & Plan:   Problem List Items Addressed This Visit       Cardiovascular and Mediastinum   Essential hypertension    bp in fair control at this time  BP Readings from Last 1 Encounters:  04/14/22 136/78  No changes needed Most recent labs reviewed  Disc lifstyle change with low sodium diet and exercise   Plan to continue amlodipine 5 mg daily Using less added salt as well and working on wt loss         Endocrine   DM (diabetes mellitus), type 2 ([place]) - Primary    Lab Results  Component Value Date   HGBA1C 6.9 (H) 04/07/2022  Pt had set back with diet /exercise after 2 back to back illnesses Is just getting back to normal with appetite and ability to exercise  Disc low glycemic diet  Pt plans to schedule eye exam  Taking statin  Px metformin xr 500 mg daily -low dose to start F/u 3 mo with lab prior        Relevant Medications   metFORMIN (GLUCOPHAGE-XR) 500 MG 24 hr tablet   Hyperlipidemia associated with type 2 diabetes mellitus ([place])    Disc goals for lipids and reasons to control them Rev last labs with pt Rev low sat fat diet in detail  Pt had to hold crestor 10 mg for 2 weeks in order to take covid anti viral  LDL is 118 with diet change Re check before next visit back on medicine      Relevant Medications   metFORMIN (GLUCOPHAGE-XR) 500 MG 24 hr tablet     Other   Obesity    Discussed how this problem influences overall health and the risks it imposes  Reviewed plan for weight loss with lower calorie diet (via better food choices and also portion control or program like weight watchers) and exercise building up to or more than 30 minutes 5 days per week including some aerobic activity        Relevant Medications   metFORMIN (GLUCOPHAGE-XR) 500 MG 24 hr tablet   Vitamin B12 deficiency    Lab Results   Component Value Date   VITAMINB12 747 04/07/2022  Better on vit B12 500 mcg daily      Vitamin D deficiency    Level of 32.4 Enc her to continue 2000 iu D3 daily  For bone and overall health

## 2022-04-14 NOTE — Assessment & Plan Note (Signed)
Level of 32.4 Enc her to continue 2000 iu D3 daily  For bone and overall health

## 2022-04-14 NOTE — Assessment & Plan Note (Signed)
Disc goals for lipids and reasons to control them Rev last labs with pt Rev low sat fat diet in detail  Pt had to hold crestor 10 mg for 2 weeks in order to take covid anti viral  LDL is 118 with diet change Re check before next visit back on medicine

## 2022-05-29 ENCOUNTER — Other Ambulatory Visit: Payer: Self-pay | Admitting: Family Medicine

## 2022-07-06 ENCOUNTER — Other Ambulatory Visit (INDEPENDENT_AMBULATORY_CARE_PROVIDER_SITE_OTHER): Payer: No Typology Code available for payment source

## 2022-07-06 DIAGNOSIS — E785 Hyperlipidemia, unspecified: Secondary | ICD-10-CM

## 2022-07-06 DIAGNOSIS — E119 Type 2 diabetes mellitus without complications: Secondary | ICD-10-CM

## 2022-07-06 DIAGNOSIS — E1169 Type 2 diabetes mellitus with other specified complication: Secondary | ICD-10-CM

## 2022-07-06 DIAGNOSIS — I10 Essential (primary) hypertension: Secondary | ICD-10-CM | POA: Diagnosis not present

## 2022-07-06 LAB — LIPID PANEL
Cholesterol: 138 mg/dL (ref 0–200)
HDL: 64.6 mg/dL (ref >39.00)
LDL Cholesterol: 62 mg/dL (ref 0–99)
NonHDL: 73.48
Total CHOL/HDL Ratio: 2
Triglycerides: 58 mg/dL (ref 0.0–149.0)
VLDL: 11.6 mg/dL (ref 0.0–40.0)

## 2022-07-06 LAB — COMPREHENSIVE METABOLIC PANEL
ALT: 11 U/L (ref 0–35)
AST: 13 U/L (ref 0–37)
Albumin: 4 g/dL (ref 3.5–5.2)
Alkaline Phosphatase: 48 U/L (ref 39–117)
BUN: 10 mg/dL (ref 6–23)
CO2: 27 mEq/L (ref 19–32)
Calcium: 9.1 mg/dL (ref 8.4–10.5)
Chloride: 106 mEq/L (ref 96–112)
Creatinine, Ser: 0.76 mg/dL (ref 0.40–1.20)
GFR: 84.08 mL/min (ref >60.00)
Glucose, Bld: 102 mg/dL — ABNORMAL HIGH (ref 70–99)
Potassium: 3.6 mEq/L (ref 3.5–5.1)
Sodium: 141 mEq/L (ref 135–145)
Total Bilirubin: 0.4 mg/dL (ref 0.2–1.2)
Total Protein: 7 g/dL (ref 6.0–8.3)

## 2022-07-06 LAB — HEMOGLOBIN A1C: Hgb A1c MFr Bld: 6.7 % — ABNORMAL HIGH (ref 4.6–6.5)

## 2022-07-13 ENCOUNTER — Ambulatory Visit: Payer: No Typology Code available for payment source | Admitting: Family Medicine

## 2022-07-13 ENCOUNTER — Encounter: Payer: Self-pay | Admitting: Family Medicine

## 2022-07-13 VITALS — BP 121/75 | HR 65 | Temp 97.8°F | Ht 67.0 in | Wt 213.0 lb

## 2022-07-13 DIAGNOSIS — I10 Essential (primary) hypertension: Secondary | ICD-10-CM | POA: Diagnosis not present

## 2022-07-13 DIAGNOSIS — E785 Hyperlipidemia, unspecified: Secondary | ICD-10-CM

## 2022-07-13 DIAGNOSIS — S060X0S Concussion without loss of consciousness, sequela: Secondary | ICD-10-CM | POA: Insufficient documentation

## 2022-07-13 DIAGNOSIS — E119 Type 2 diabetes mellitus without complications: Secondary | ICD-10-CM

## 2022-07-13 DIAGNOSIS — E6609 Other obesity due to excess calories: Secondary | ICD-10-CM

## 2022-07-13 DIAGNOSIS — K5900 Constipation, unspecified: Secondary | ICD-10-CM

## 2022-07-13 DIAGNOSIS — Z6833 Body mass index (BMI) 33.0-33.9, adult: Secondary | ICD-10-CM

## 2022-07-13 DIAGNOSIS — Z7984 Long term (current) use of oral hypoglycemic drugs: Secondary | ICD-10-CM

## 2022-07-13 DIAGNOSIS — L304 Erythema intertrigo: Secondary | ICD-10-CM

## 2022-07-13 DIAGNOSIS — E1169 Type 2 diabetes mellitus with other specified complication: Secondary | ICD-10-CM | POA: Diagnosis not present

## 2022-07-13 NOTE — Assessment & Plan Note (Signed)
Improved  5 lb wt loss so far  Metformin has helped appetite  Discussed how this problem influences overall health and the risks it imposes  Reviewed plan for weight loss with lower calorie diet (via better food choices and also portion control or program like weight watchers) and exercise building up to or more than 30 minutes 5 days per week including some aerobic activity   Encouraged to add strength training Encouraged low glycemic exercise  Commended wt lost so far

## 2022-07-13 NOTE — Assessment & Plan Note (Signed)
bp in fair control at this time  BP Readings from Last 1 Encounters:  07/13/22 121/75   No changes needed Most recent labs reviewed  Disc lifstyle change with low sodium diet and exercise   Plan to continue amlodipine 5 mg daily Using less added salt as well and working on wt loss

## 2022-07-13 NOTE — Progress Notes (Signed)
Subjective:    Patient ID: Crystal Tucker, female    DOB: [birth year], 62 y.o.   MRN: [id number]  HPI Pt presents for f/u of DM2 and HTN and chronic health problems  Constipation Rash under breast   Wt Readings from Last 3 Encounters:  07/13/22 213 lb (96.6 kg)  04/14/22 218 lb 4 oz (99 kg)  03/26/22 216 lb (98 kg)   33.36 kg/m  Doing ok overall  Feeling fair    Work hours are very long  Had to take over another position    Had a rash come up under R breast - ? Heat  Itchy  Used some aloe vera / yeast treatment - it finally improved   HTN  No cp or palpitations or headaches or edema  No side effects to medicines  BP Readings from Last 3 Encounters:  07/13/22 (!) 140/78  04/14/22 136/78  03/26/22 138/78    Amlodipine 5 mg daily     Last metabolic panel Lab Results  Component Value Date   GLUCOSE 102 (H) 07/06/2022   NA 141 07/06/2022   K 3.6 07/06/2022   CL 106 07/06/2022   CO2 27 07/06/2022   BUN 10 07/06/2022   CREATININE 0.76 07/06/2022   GFRNONAA >60 09/14/2021   CALCIUM 9.1 07/06/2022   PHOS 3.7 03/22/2006   PROT 7.0 07/06/2022   ALBUMIN 4.0 07/06/2022   BILITOT 0.4 07/06/2022   ALKPHOS 48 07/06/2022   AST 13 07/06/2022   ALT 11 07/06/2022   ANIONGAP 7 09/14/2021   GFR of 84.0   Takes klor con 20 meq daily     DM2 Lab Results  Component Value Date   HGBA1C 6.7 (H) 07/06/2022   This is down from 6.9   Metformin xr 500 mg daily - having some trouble with it  Has constipated her (instead of diarrhea) -now is a little better  Last colonoscopy 01/2021 with 5 y recall Taking a statin  Cannot tolerate ace   Less appetite with metformin   Eye exam : was scheduled for today but the office had to schedule   Exercise- walking  10-15 minutes (at work) daily indoors or out     Lab Results  Component Value Date   MICROALBUR 2.0 (H) 10/12/2021    Hyperlipidemia Lab Results  Component Value Date   CHOL 138 07/06/2022   CHOL 186  04/07/2022   CHOL 198 01/05/2022   Lab Results  Component Value Date   HDL 64.60 07/06/2022   HDL 51.10 04/07/2022   HDL 61.20 01/05/2022   Lab Results  Component Value Date   LDLCALC 62 07/06/2022   LDLCALC 118 (H) 04/07/2022   LDLCALC 125 (H) 01/05/2022   Lab Results  Component Value Date   TRIG 58.0 07/06/2022   TRIG 85.0 04/07/2022   TRIG 58.0 01/05/2022   Lab Results  Component Value Date   CHOLHDL 2 07/06/2022   CHOLHDL 4 04/07/2022   CHOLHDL 3 01/05/2022   No results found for: "LDLDIRECT" Crestor 10 mg daily  Improved  LDL is at goal now   Patient Active Problem List   Diagnosis Date Noted   Intertrigo 07/13/2022   Constipation 07/13/2022   DM (diabetes mellitus), type 2 (HCC) 01/12/2022   Routine general medical examination at a health care facility 10/12/2021   OA (osteoarthritis) of knee 01/07/2021   Vitamin B12 deficiency 07/29/2020   Vitamin D deficiency 07/29/2020   Colon cancer screening 07/29/2020   Current use of  proton pump inhibitor 07/16/2020   Chronic pain of left thumb 04/10/2020   Flatus 10/31/2019   Dizziness 09/24/2019   History of heart surgery 09/24/2019   Pain of left hip joint 02/22/2019   Hemorrhoids 12/11/2018   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 07/05/2017   Hyperlipidemia associated with type 2 diabetes mellitus (HCC) 11/04/2016   Encounter for screening for HIV 11/03/2016   Encounter for hepatitis C screening test for low risk patient 11/03/2016   Encounter for screening mammogram for breast cancer 11/03/2015   Obesity 11/10/2011   Encounter for general adult medical examination with abnormal findings 11/01/2011   Allergic rhinitis 07/16/2011   OSA (obstructive sleep apnea) 07/16/2011   DEPENDENT EDEMA, LEGS, BILATERAL 10/11/2008   Essential hypertension 10/26/2006   GERD 10/26/2006   Diverticulosis of large intestine 10/26/2006   COLONIC POLYPS, HX OF 10/26/2006   Past Medical History:   Diagnosis Date   Allergy    Anemia    NOS   Arthritis    Cardiac valve prolapse    leaking valve with surgery 1998   Contact with or exposure to venereal diseases    Diverticulosis of colon (without mention of hemorrhage)    Edema    GERD (gastroesophageal reflux disease)    H/O: pneumonia    Hypertension    Pneumonia    Pruritus of genital organs    Rash and other nonspecific skin eruption    Sleep apnea    Tubular adenoma of colon    Unspecified symptom associated with female genital organs    Past Surgical History:  Procedure Laterality Date   CARDIAC VALVE SURGERY  1998   COLONOSCOPY  7/03   polyps and tics   DIAGNOSTIC LAPAROSCOPY     KNEE ARTHROSCOPY     right   KNEE SURGERY  10/04   LAPAROSCOPY N/A 11/12/2014   Procedure: LAPAROSCOPY DIAGNOSTIC;  Surgeon: Richarda Overlie, MD;  Location: [facility];  Service: Gynecology;  Laterality: N/A;   PARTIAL HYSTERECTOMY     POLYPECTOMY     RECTAL POLYPECTOMY  8/07   ROTATOR CUFF REPAIR Right    2   SALPINGOOPHORECTOMY Right 11/12/2014   Procedure: SALPINGO OOPHORECTOMY;  Surgeon: Richarda Overlie, MD;  Location: [facility];  Service: Gynecology;  Laterality: Right;   WRIST SURGERY Right    x 2   Social History   Tobacco Use   Smoking status: Former    Packs/day: 0.80    Years: 30.00    Additional pack years: 0.00    Total pack years: 24.00    Types: Cigarettes    Quit date: 03/01/2002    Years since quitting: 20.3   Smokeless tobacco: Never  Vaping Use   Vaping Use: Never used  Substance Use Topics   Alcohol use: Not Currently   Drug use: No   Family History  Problem Relation Age of Onset   Colon polyps Mother    Diabetes Mother    Hypertension Mother    Diabetes Maternal Aunt    Heart disease Maternal Aunt    Heart disease Maternal Uncle    Cancer Paternal Aunt        type unknown   Cerebral aneurysm Paternal Aunt    Cancer Paternal Aunt        x 2 , types unknown   Heart disease Maternal Grandmother     Cerebral aneurysm Cousin    Colon cancer Neg Hx    Esophageal cancer Neg Hx  Rectal cancer Neg Hx    Stomach cancer Neg Hx    Pancreatic cancer Neg Hx    Breast cancer Neg Hx    Allergies  Allergen Reactions   Lisinopril     cough   Citrus Rash   Current Outpatient Medications on File Prior to Visit  Medication Sig Dispense Refill   Acetaminophen (TYLENOL ARTHRITIS EXT RELIEF PO) Take 2 tablets by mouth daily.     albuterol (PROAIR HFA) 108 (90 Base) MCG/ACT inhaler Inhale 2 puffs into the lungs every 6 (six) hours as needed for wheezing or shortness of breath. 18 g 5   amLODipine (NORVASC) 5 MG tablet Take 1 tablet by mouth once daily 90 tablet 0   cholecalciferol (VITAMIN D3) 25 MCG (1000 UNIT) tablet Take 2,000 Units by mouth daily.     metFORMIN (GLUCOPHAGE-XR) 500 MG 24 hr tablet Take 1 tablet (500 mg total) by mouth daily with breakfast. 90 tablet 3   methocarbamol (ROBAXIN) 500 MG tablet TAKE 1 TABLET BY MOUTH EVERY 8 HOURS AS NEEDED FOR MUSCLE SPASM (BACK  PAIN)  (WATCH  OUT  FOR  SEDATION) 30 tablet 2   mometasone (NASONEX) 50 MCG/ACT nasal spray Place 2 sprays into the nose daily. 1 each 12   omeprazole (PRILOSEC) 20 MG capsule Take 1 capsule by mouth once daily 90 capsule 1   potassium chloride SA (KLOR-CON M20) 20 MEQ tablet Take 1 tablet by mouth once daily 90 tablet 1   rosuvastatin (CRESTOR) 10 MG tablet Take 1 tablet (10 mg total) by mouth daily. 90 tablet 3   vitamin B-12 (CYANOCOBALAMIN) 500 MCG tablet Take 500 mcg by mouth daily.     No current facility-administered medications on file prior to visit.     Review of Systems  Constitutional:  Positive for fatigue. Negative for activity change, appetite change, fever and unexpected weight change.  HENT:  Negative for congestion, ear pain, rhinorrhea, sinus pressure and sore throat.   Eyes:  Negative for pain, redness and visual disturbance.  Respiratory:  Negative for cough, shortness of breath and wheezing.    Cardiovascular:  Negative for chest pain and palpitations.  Gastrointestinal:  Positive for constipation. Negative for abdominal pain, blood in stool, diarrhea, nausea and rectal pain.  Endocrine: Negative for polydipsia and polyuria.  Genitourinary:  Negative for dysuria, frequency and urgency.  Musculoskeletal:  Negative for arthralgias, back pain and myalgias.  Skin:  Positive for rash. Negative for pallor.  Allergic/Immunologic: Negative for environmental allergies.  Neurological:  Negative for dizziness, syncope and headaches.  Hematological:  Negative for adenopathy. Does not bruise/bleed easily.  Psychiatric/Behavioral:  Negative for decreased concentration and dysphoric mood. The patient is not nervous/anxious.        Objective:   Physical Exam Constitutional:      General: She is not in acute distress.    Appearance: Normal appearance. She is well-developed. She is obese. She is not ill-appearing or diaphoretic.  HENT:     Head: Normocephalic and atraumatic.     Mouth/Throat:     Mouth: Mucous membranes are moist.  Eyes:     Conjunctiva/sclera: Conjunctivae normal.     Pupils: Pupils are equal, round, and reactive to light.  Neck:     Thyroid: No thyromegaly.     Vascular: No carotid bruit or JVD.  Cardiovascular:     Rate and Rhythm: Normal rate and regular rhythm.     Heart sounds: Normal heart sounds.     No gallop.  Pulmonary:     Effort: Pulmonary effort is normal. No respiratory distress.     Breath sounds: Normal breath sounds. No stridor. No wheezing, rhonchi or rales.  Abdominal:     General: There is no distension or abdominal bruit.     Palpations: Abdomen is soft. There is no mass.     Tenderness: There is no abdominal tenderness. There is no guarding or rebound.     Hernia: No hernia is present.  Musculoskeletal:     Cervical back: Normal range of motion and neck supple.     Right lower leg: No edema.     Left lower leg: No edema.  Lymphadenopathy:      Cervical: No cervical adenopathy.  Skin:    General: Skin is warm and dry.     Coloration: Skin is not pale.     Findings: No rash.  Neurological:     Mental Status: She is alert.     Coordination: Coordination normal.     Deep Tendon Reflexes: Reflexes are normal and symmetric. Reflexes normal.  Psychiatric:        Mood and Affect: Mood normal.           Assessment & Plan:   Problem List Items Addressed This Visit       Cardiovascular and Mediastinum   Essential hypertension    bp in fair control at this time  BP Readings from Last 1 Encounters:  07/13/22 121/75  No changes needed Most recent labs reviewed  Disc lifstyle change with low sodium diet and exercise   Plan to continue amlodipine 5 mg daily Using less added salt as well and working on wt loss         Endocrine   DM (diabetes mellitus), type 2 (HCC) - Primary    Improved Lab Results  Component Value Date   HGBA1C 6.7 (H) 07/06/2022  Taking metformin xr 500 mg daily -  doubt this caused constipation but regardless, that has improved  Also losing weight  Encouraged low glycemic diet and add some strength training when able On statin  Cannot take ace Microalb utd  Needs eye exam- her doctor had to cancel her visit today so she will re schedule F/u 6 mo for annual exam       Hyperlipidemia associated with type 2 diabetes mellitus (HCC)    Disc goals for lipids and reasons to control them Rev last labs with pt Rev low sat fat diet in detail  LDL is much improved with crestor 10 mg daily  At goal - 62 HDL up also          Musculoskeletal and Integument   Intertrigo    Pt had area of candidal intertrigo under R breast  (pt is diabetic) Now improved with anti fungal topical otc  Discussed effort to keep area clean and dry  Instructed to f/u if it worsens again         Other   Constipation    This started with metformin but now improving  Reassuring exam   Discussed dietary fiber   Encouraged 64 oz of fluids daily  Exercise  Watch for pain or blood in stool  Reassuring exam today  Colonoscopy is utd  Recommend trial of miralax daily  Update/follow up if no further improvement       Obesity    Improved  5 lb wt loss so far  Metformin has helped appetite  Discussed how this problem influences overall health  and the risks it imposes  Reviewed plan for weight loss with lower calorie diet (via better food choices and also portion control or program like weight watchers) and exercise building up to or more than 30 minutes 5 days per week including some aerobic activity   Encouraged to add strength training Encouraged low glycemic exercise  Commended wt lost so far

## 2022-07-13 NOTE — Assessment & Plan Note (Signed)
This started with metformin but now improving  Reassuring exam   Discussed dietary fiber  Encouraged 64 oz of fluids daily  Exercise  Watch for pain or blood in stool  Reassuring exam today  Colonoscopy is utd  Recommend trial of miralax daily  Update/follow up if no further improvement

## 2022-07-13 NOTE — Assessment & Plan Note (Signed)
Pt had area of candidal intertrigo under R breast  (pt is diabetic) Now improved with anti fungal topical otc  Discussed effort to keep area clean and dry  Instructed to f/u if it worsens again

## 2022-07-13 NOTE — Assessment & Plan Note (Signed)
Improved Lab Results  Component Value Date   HGBA1C 6.7 (H) 07/06/2022   Taking metformin xr 500 mg daily -  doubt this caused constipation but regardless, that has improved  Also losing weight  Encouraged low glycemic diet and add some strength training when able On statin  Cannot take ace Microalb utd  Needs eye exam- her doctor had to cancel her visit today so she will re schedule F/u 6 mo for annual exam

## 2022-07-13 NOTE — Assessment & Plan Note (Signed)
Disc goals for lipids and reasons to control them Rev last labs with pt Rev low sat fat diet in detail  LDL is much improved with crestor 10 mg daily  At goal - 62 HDL up also

## 2022-07-13 NOTE — Patient Instructions (Addendum)
Drink lots of fluids   at least 64 oz per day  Try to get most of your carbohydrates from produce (with the exception of white potatoes)  Eat less bread/pasta/rice/snack foods/cereals/sweets and other items from the middle of the grocery store (processed carbs)  Please work on your protein intake  Get some lean protein with every meal    Miralax is my favorite thing for constipation (look for the store brand)   Re schedule your eye exam when you can  Tell them you need diabetic eye exam   Keep walking  Add some strength training to your routine, this is important for bone and brain health and can reduce your risk of falls and help your body use insulin properly and regulate weight  Light weights, exercise bands , and internet videos are a good way to start  Yoga (chair or regular), machines , floor exercises or a gym with machines are also good options    Cholesterol is improved  A1c is better at 6.7

## 2022-07-26 ENCOUNTER — Other Ambulatory Visit: Payer: Self-pay | Admitting: Family Medicine

## 2022-07-26 DIAGNOSIS — Z1231 Encounter for screening mammogram for malignant neoplasm of breast: Secondary | ICD-10-CM

## 2022-08-10 ENCOUNTER — Other Ambulatory Visit: Payer: Self-pay | Admitting: Family Medicine

## 2022-08-28 ENCOUNTER — Other Ambulatory Visit: Payer: Self-pay | Admitting: Family Medicine

## 2022-09-02 ENCOUNTER — Ambulatory Visit
Admission: RE | Admit: 2022-09-02 | Discharge: 2022-09-02 | Disposition: A | Discharge status: Home / Self Care | Payer: No Typology Code available for payment source | Source: Ambulatory Visit | Attending: Family Medicine | Admitting: Family Medicine

## 2022-09-02 DIAGNOSIS — Z1231 Encounter for screening mammogram for malignant neoplasm of breast: Secondary | ICD-10-CM

## 2022-10-28 ENCOUNTER — Encounter: Payer: Self-pay | Admitting: Family Medicine

## 2022-10-28 ENCOUNTER — Ambulatory Visit: Payer: No Typology Code available for payment source | Admitting: Family Medicine

## 2022-10-28 VITALS — BP 159/88 | HR 66 | Temp 97.6°F | Ht 67.0 in | Wt 211.2 lb

## 2022-10-28 DIAGNOSIS — Z792 Long term (current) use of antibiotics: Secondary | ICD-10-CM

## 2022-10-28 DIAGNOSIS — I10 Essential (primary) hypertension: Secondary | ICD-10-CM | POA: Diagnosis not present

## 2022-10-28 DIAGNOSIS — H9202 Otalgia, left ear: Secondary | ICD-10-CM

## 2022-10-28 DIAGNOSIS — K0889 Other specified disorders of teeth and supporting structures: Secondary | ICD-10-CM | POA: Insufficient documentation

## 2022-10-28 MED ORDER — AMOXICILLIN 500 MG PO CAPS: ORAL_CAPSULE | ORAL | 1 refills | Status: DC

## 2022-10-28 NOTE — Assessment & Plan Note (Signed)
Blood pressure is up today likely due to dental pain  BP: (!) 159/88   Taking amlodipine 5 mg daily  Instructed to check at home when more relaxed and call if not improved so we can adj therapy

## 2022-10-28 NOTE — Assessment & Plan Note (Signed)
I think this is referred pain from left sided molar infection/ decay (unsure of actual diagnosis)   Ear exam is normal No facial swelling or fever   Instructed to return to dentist (also get est with new dentist next week) If blood pressure comes down at home could try small dose of nsaid with caution

## 2022-10-28 NOTE — Progress Notes (Signed)
Subjective:    Patient ID: Crystal Tucker, female    DOB: May 12, 1960, 62 y.o.   MRN: [id number]  HPI  Wt Readings from Last 3 Encounters:  10/28/22 211 lb 4 oz (95.8 kg)  07/13/22 213 lb (96.6 kg)  04/14/22 218 lb 4 oz (99 kg)   33.09 kg/m  Vitals:   10/28/22 0754 10/28/22 0823  BP: (!) 164/98 (!) 159/88  Pulse: 66   Temp: 97.6 F (36.4 C)   SpO2: 99%     Pt presents for c/o ear pain   Saw dentist for left sided teeth/jaw pain  Crystal Tucker)  Found infection in 2 molars  Affecting sinuses    Was prescription clindamycin -did not help her symptoms much  She could not get response from them when she called   Has a new denist next week Crystal Tucker  - dentist [phone] in [place]  Thus aug 29th   Some post nasal drip  Some allergies this time of year  No colored mucous   Pain in ear is very sharp Comes and goes  Worse if she lies on that side   Over the counter  Tylenol  Used an old hydrocodone pills left over - cutting in 1/2   Nsaids upset stomach   HTN bp is stable today  No cp or palpitations or headaches or edema  No side effects to medicines  BP Readings from Last 3 Encounters:  10/28/22 (!) 159/88  07/13/22 121/75  04/14/22 136/78    Alodipine 5 mg daily   Patient Active Problem List   Diagnosis Date Noted   Pain, dental 10/28/2022   Need for antibiotic prophylaxis for dental procedure 10/28/2022   Intertrigo 07/13/2022   Constipation 07/13/2022   Left ear pain 01/12/2022   DM (diabetes mellitus), type 2 (HCC) 01/12/2022   Routine general medical examination at a health care facility 10/12/2021   OA (osteoarthritis) of knee 01/07/2021   Vitamin B12 deficiency 07/29/2020   Vitamin D deficiency 07/29/2020   Colon cancer screening 07/29/2020   Current use of proton pump inhibitor 07/16/2020   Chronic pain of left thumb 04/10/2020   Flatus 10/31/2019   Dizziness 09/24/2019   History of heart surgery 09/24/2019   Pain of left  hip joint 02/22/2019   Hemorrhoids 12/11/2018   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 07/05/2017   Hyperlipidemia associated with type 2 diabetes mellitus (HCC) 11/04/2016   Encounter for screening for HIV 11/03/2016   Encounter for hepatitis C screening test for low risk patient 11/03/2016   Encounter for screening mammogram for breast cancer 11/03/2015   Obesity 11/10/2011   Encounter for general adult medical examination with abnormal findings 11/01/2011   Allergic rhinitis 07/16/2011   OSA (obstructive sleep apnea) 07/16/2011   DEPENDENT EDEMA, LEGS, BILATERAL 10/11/2008   Essential hypertension 10/26/2006   Diverticulosis of large intestine 10/26/2006   COLONIC POLYPS, HX OF 10/26/2006   Past Medical History:  Diagnosis Date   Allergy    Anemia    NOS   Arthritis    Cardiac valve prolapse    leaking valve with surgery 1998   Contact with or exposure to venereal diseases    Diverticulosis of colon (without mention of hemorrhage)    Edema    GERD (gastroesophageal reflux disease)    H/O: pneumonia    Hypertension    Pneumonia    Pruritus of genital organs    Rash and other nonspecific skin eruption  Sleep apnea    Tubular adenoma of colon    Unspecified symptom associated with female genital organs    Past Surgical History:  Procedure Laterality Date   CARDIAC VALVE SURGERY  1998   COLONOSCOPY  7/03   polyps and tics   DIAGNOSTIC LAPAROSCOPY     KNEE ARTHROSCOPY     right   KNEE SURGERY  10/04   LAPAROSCOPY N/A 11/12/2014   Procedure: LAPAROSCOPY DIAGNOSTIC;  Surgeon: Richarda Overlie, MD;  Location: [facility];  Service: Gynecology;  Laterality: N/A;   PARTIAL HYSTERECTOMY     POLYPECTOMY     RECTAL POLYPECTOMY  8/07   ROTATOR CUFF REPAIR Right    2   SALPINGOOPHORECTOMY Right 11/12/2014   Procedure: SALPINGO OOPHORECTOMY;  Surgeon: Richarda Overlie, MD;  Location: [facility];  Service: Gynecology;  Laterality: Right;   WRIST SURGERY Right     x 2   Social History   Tobacco Use   Smoking status: Former    Current packs/day: 0.00    Average packs/day: 0.8 packs/day for 30.0 years (24.0 ttl pk-yrs)    Types: Cigarettes    Start date: 03/01/1972    Quit date: 03/01/2002    Years since quitting: 20.6   Smokeless tobacco: Never  Vaping Use   Vaping status: Never Used  Substance Use Topics   Alcohol use: Not Currently   Drug use: No   Family History  Problem Relation Age of Onset   Colon polyps Mother    Diabetes Mother    Hypertension Mother    Diabetes Maternal Aunt    Heart disease Maternal Aunt    Heart disease Maternal Uncle    Cancer Paternal Aunt        type unknown   Cerebral aneurysm Paternal Aunt    Cancer Paternal Aunt        x 2 , types unknown   Heart disease Maternal Grandmother    Cerebral aneurysm Cousin    Colon cancer Neg Hx    Esophageal cancer Neg Hx    Rectal cancer Neg Hx    Stomach cancer Neg Hx    Pancreatic cancer Neg Hx    Breast cancer Neg Hx    Allergies  Allergen Reactions   Lisinopril     cough   Citrus Rash   Current Outpatient Medications on File Prior to Visit  Medication Sig Dispense Refill   Acetaminophen (TYLENOL ARTHRITIS EXT RELIEF PO) Take 2 tablets by mouth daily.     albuterol (PROAIR HFA) 108 (90 Base) MCG/ACT inhaler Inhale 2 puffs into the lungs every 6 (six) hours as needed for wheezing or shortness of breath. 18 g 5   amLODipine (NORVASC) 5 MG tablet Take 1 tablet by mouth once daily 90 tablet 0   cholecalciferol (VITAMIN D3) 25 MCG (1000 UNIT) tablet Take 2,000 Units by mouth daily.     KLOR-CON M20 20 MEQ tablet Take 1 tablet by mouth once daily 90 tablet 0   metFORMIN (GLUCOPHAGE-XR) 500 MG 24 hr tablet Take 1 tablet (500 mg total) by mouth daily with breakfast. 90 tablet 3   mometasone (NASONEX) 50 MCG/ACT nasal spray Place 2 sprays into the nose daily. 1 each 12   omeprazole (PRILOSEC) 20 MG capsule Take 1 capsule by mouth once daily 90 capsule 1    rosuvastatin (CRESTOR) 10 MG tablet Take 1 tablet (10 mg total) by mouth daily. 90 tablet 3   vitamin B-12 (CYANOCOBALAMIN) 500 MCG tablet Take 500 mcg  by mouth daily.     No current facility-administered medications on file prior to visit.    Review of Systems  Constitutional:  Negative for activity change, appetite change, fatigue, fever and unexpected weight change.  HENT:  Positive for dental problem, ear pain and postnasal drip. Negative for congestion, ear discharge, facial swelling, hearing loss, rhinorrhea, sore throat and trouble swallowing.   Eyes:  Negative for pain, redness, itching and visual disturbance.  Respiratory:  Negative for cough, chest tightness, shortness of breath and wheezing.   Cardiovascular:  Negative for chest pain and palpitations.  Gastrointestinal:  Negative for abdominal pain, blood in stool, constipation, diarrhea and nausea.  Endocrine: Negative for cold intolerance, heat intolerance, polydipsia and polyuria.  Genitourinary:  Negative for difficulty urinating, dysuria, frequency and urgency.  Musculoskeletal:  Negative for arthralgias, joint swelling and myalgias.  Skin:  Negative for pallor and rash.  Neurological:  Negative for dizziness, tremors, weakness, numbness and headaches.  Hematological:  Negative for adenopathy. Does not bruise/bleed easily.  Psychiatric/Behavioral:  Negative for decreased concentration and dysphoric mood. The patient is not nervous/anxious.        Objective:   Physical Exam Constitutional:      General: She is not in acute distress.    Appearance: Normal appearance. She is obese. She is not ill-appearing or diaphoretic.  HENT:     Head: Normocephalic and atraumatic.     Comments: No facial swelling  Some soreness of left upper jaw area     Right Ear: Tympanic membrane, ear canal and external ear normal. There is no impacted cerumen.     Left Ear: Tympanic membrane, ear canal and external ear normal. There is no  impacted cerumen.     Nose:     Comments: Boggy nares     Mouth/Throat:     Mouth: Mucous membranes are moist.     Pharynx: Oropharynx is clear.     Comments: No tenderness of left upper molars when tapped with tongue blade  Slight diffuse swelling of gums No masses or lesions  Eyes:     General:        Right eye: No discharge.        Left eye: No discharge.     Conjunctiva/sclera: Conjunctivae normal.     Pupils: Pupils are equal, round, and reactive to light.  Cardiovascular:     Rate and Rhythm: Normal rate and regular rhythm.     Heart sounds: Murmur heard.  Pulmonary:     Effort: Pulmonary effort is normal. No respiratory distress.     Breath sounds: Normal breath sounds. No wheezing.  Musculoskeletal:     Cervical back: Neck supple.  Lymphadenopathy:     Cervical: No cervical adenopathy.  Skin:    General: Skin is warm and dry.     Findings: No erythema or rash.  Neurological:     Mental Status: She is alert.     Cranial Nerves: No cranial nerve deficit.  Psychiatric:        Mood and Affect: Mood normal.           Assessment & Plan:   Problem List Items Addressed This Visit       Cardiovascular and Mediastinum   Essential hypertension    Blood pressure is up today likely due to dental pain  BP: (!) 159/88   Taking amlodipine 5 mg daily  Instructed to check at home when more relaxed and call if not improved so we can  adj therapy         Other   Left ear pain - Primary    I think this is referred pain from left sided molar infection/ decay (unsure of actual diagnosis)   Ear exam is normal No facial swelling or fever   Instructed to return to dentist (also get est with new dentist next week) If blood pressure comes down at home could try small dose of nsaid with caution      Need for antibiotic prophylaxis for dental procedure    Amox 2 g 30-60 mg prior to any dental procedure needed for endo proph due to past valve surgery  Prescription sent  today      Pain, dental    Left upper molars (one of them with a crown) Finished course of clindamycin without much improvement and is waiting on another dental opiniton next week   ? If abscess or other  Slight gum swelling noted No facial swelling This is causing ear pain   Did prescription amox 2 g for any upcoming cleaning /procedure due to prior heart valve surgery  She will check in with first dentist to day in person (Not responding ot calls) and let us know her status   May need extraction/ root canal or other surgery (? Don't know yet) If blood pressure comes down to baseline can try small dose of nsaid with caution (see AVS)   Call back and Er precautions noted in detail today

## 2022-10-28 NOTE — Assessment & Plan Note (Signed)
Left upper molars (one of them with a crown) Finished course of clindamycin without much improvement and is waiting on another dental opiniton next week   ? If abscess or other  Slight gum swelling noted No facial swelling This is causing ear pain   Did prescription amox 2 g for any upcoming cleaning /procedure due to prior heart valve surgery  She will check in with first dentist to day in person (Not responding ot calls) and let us know her status   May need extraction/ root canal or other surgery (? Don't know yet) If blood pressure comes down to baseline can try small dose of nsaid with caution (see AVS)   Call back and Er precautions noted in detail today

## 2022-10-28 NOTE — Patient Instructions (Addendum)
Go to the dentist today and let them know what is going on   Check your blood pressure at home when you are not here when more relaxed  If it is under 140 top and 90 on the bottom it would be safe to try an anti inflammatory    Try aleve (naproxen) 1 pill with a full meal (make sure stomach is full)  If you do ok - can take 1 pill every 12 hours   For the upcoming dental cleaning-take the amoxicillin prior (I sent it in)

## 2022-10-28 NOTE — Assessment & Plan Note (Signed)
Amox 2 g 30-60 mg prior to any dental procedure needed for endo proph due to past valve surgery  Prescription sent today

## 2022-11-04 ENCOUNTER — Telehealth: Payer: Self-pay | Admitting: Family Medicine

## 2022-11-04 MED ORDER — FLUCONAZOLE 150 MG PO TABS: 150.0000 mg | ORAL_TABLET | Freq: Once | ORAL | 0 refills | Status: AC

## 2022-11-04 NOTE — Addendum Note (Signed)
Addended by: Roxy Manns A on: 11/04/2022 12:29 PM   Modules accepted: Orders

## 2022-11-04 NOTE — Telephone Encounter (Signed)
Pt notified Rx sent to pharmacy and advise of PCP's comments  

## 2022-11-04 NOTE — Telephone Encounter (Signed)
Diflucan sent to [facility] Follow up for visit if not improving

## 2022-11-04 NOTE — Telephone Encounter (Signed)
Patient called in and stated that she prescribed an antibiotic for her dental work. She stated that she has a yeast infection now and was wanting to know if something could be sent in for her. Thank you!

## 2022-11-06 ENCOUNTER — Other Ambulatory Visit: Payer: Self-pay | Admitting: Family Medicine

## 2022-11-08 ENCOUNTER — Telehealth: Payer: Self-pay | Admitting: Family Medicine

## 2022-11-08 DIAGNOSIS — E559 Vitamin D deficiency, unspecified: Secondary | ICD-10-CM

## 2022-11-08 DIAGNOSIS — E538 Deficiency of other specified B group vitamins: Secondary | ICD-10-CM

## 2022-11-08 DIAGNOSIS — I10 Essential (primary) hypertension: Secondary | ICD-10-CM

## 2022-11-08 DIAGNOSIS — E1169 Type 2 diabetes mellitus with other specified complication: Secondary | ICD-10-CM

## 2022-11-08 DIAGNOSIS — E119 Type 2 diabetes mellitus without complications: Secondary | ICD-10-CM

## 2022-11-08 DIAGNOSIS — E785 Hyperlipidemia, unspecified: Secondary | ICD-10-CM

## 2022-11-08 NOTE — Telephone Encounter (Signed)
-----   Message from Lovena Neighbours sent at 10/25/2022 11:41 AM EDT ----- Regarding: 9.3.24 Please put fasting physical lab orders in future. Thank you, Denny Peon

## 2022-11-09 ENCOUNTER — Other Ambulatory Visit: Payer: No Typology Code available for payment source

## 2022-11-12 ENCOUNTER — Other Ambulatory Visit: Payer: No Typology Code available for payment source

## 2022-11-12 DIAGNOSIS — E119 Type 2 diabetes mellitus without complications: Secondary | ICD-10-CM

## 2022-11-12 DIAGNOSIS — E538 Deficiency of other specified B group vitamins: Secondary | ICD-10-CM

## 2022-11-12 DIAGNOSIS — I10 Essential (primary) hypertension: Secondary | ICD-10-CM

## 2022-11-12 DIAGNOSIS — E1169 Type 2 diabetes mellitus with other specified complication: Secondary | ICD-10-CM | POA: Diagnosis not present

## 2022-11-12 DIAGNOSIS — E785 Hyperlipidemia, unspecified: Secondary | ICD-10-CM | POA: Diagnosis not present

## 2022-11-12 DIAGNOSIS — E559 Vitamin D deficiency, unspecified: Secondary | ICD-10-CM

## 2022-11-12 LAB — CBC WITH DIFFERENTIAL/PLATELET
Basophils Absolute: 0.1 10*3/uL (ref 0.0–0.1)
Basophils Relative: 1.3 % (ref 0.0–3.0)
Eosinophils Absolute: 0.1 10*3/uL (ref 0.0–0.7)
Eosinophils Relative: 2.7 % (ref 0.0–5.0)
HCT: 39.1 % (ref 36.0–46.0)
Hemoglobin: 12.3 g/dL (ref 12.0–15.0)
Lymphocytes Relative: 42.5 % (ref 12.0–46.0)
Lymphs Abs: 2.4 10*3/uL (ref 0.7–4.0)
MCHC: 31.4 g/dL (ref 30.0–36.0)
MCV: 82.3 fl (ref 78.0–100.0)
Monocytes Absolute: 0.4 10*3/uL (ref 0.1–1.0)
Monocytes Relative: 6.6 % (ref 3.0–12.0)
Neutro Abs: 2.6 10*3/uL (ref 1.4–7.7)
Neutrophils Relative %: 46.9 % (ref 43.0–77.0)
Platelets: 267 10*3/uL (ref 150.0–400.0)
RBC: 4.75 Mil/uL (ref 3.87–5.11)
RDW: 14.1 % (ref 11.5–15.5)
WBC: 5.6 10*3/uL (ref 4.0–10.5)

## 2022-11-12 LAB — COMPREHENSIVE METABOLIC PANEL
ALT: 12 U/L (ref 0–35)
AST: 12 U/L (ref 0–37)
Albumin: 4.2 g/dL (ref 3.5–5.2)
Alkaline Phosphatase: 53 U/L (ref 39–117)
BUN: 15 mg/dL (ref 6–23)
CO2: 28 meq/L (ref 19–32)
Calcium: 9.2 mg/dL (ref 8.4–10.5)
Chloride: 105 meq/L (ref 96–112)
Creatinine, Ser: 0.82 mg/dL (ref 0.40–1.20)
GFR: 76.56 mL/min (ref >60.00)
Glucose, Bld: 106 mg/dL — ABNORMAL HIGH (ref 70–99)
Potassium: 4.2 meq/L (ref 3.5–5.1)
Sodium: 141 meq/L (ref 135–145)
Total Bilirubin: 0.5 mg/dL (ref 0.2–1.2)
Total Protein: 7.1 g/dL (ref 6.0–8.3)

## 2022-11-12 LAB — TSH: TSH: 0.8 u[IU]/mL (ref 0.35–5.50)

## 2022-11-12 LAB — LIPID PANEL
Cholesterol: 147 mg/dL (ref 0–200)
HDL: 69.8 mg/dL (ref >39.00)
LDL Cholesterol: 66 mg/dL (ref 0–99)
NonHDL: 77.49
Total CHOL/HDL Ratio: 2
Triglycerides: 55 mg/dL (ref 0.0–149.0)
VLDL: 11 mg/dL (ref 0.0–40.0)

## 2022-11-12 LAB — HEMOGLOBIN A1C: Hgb A1c MFr Bld: 6.6 % — ABNORMAL HIGH (ref 4.6–6.5)

## 2022-11-12 LAB — MICROALBUMIN / CREATININE URINE RATIO
Creatinine,U: 453.3 mg/dL
Microalb Creat Ratio: 0.7 mg/g (ref 0.0–30.0)
Microalb, Ur: 3.3 mg/dL — ABNORMAL HIGH (ref 0.0–1.9)

## 2022-11-12 LAB — VITAMIN D 25 HYDROXY (VIT D DEFICIENCY, FRACTURES): VITD: 42.19 ng/mL (ref 30.00–100.00)

## 2022-11-12 LAB — VITAMIN B12: Vitamin B-12: 692 pg/mL (ref 211–911)

## 2022-11-15 ENCOUNTER — Encounter: Payer: Self-pay | Admitting: Family Medicine

## 2022-11-15 ENCOUNTER — Ambulatory Visit (INDEPENDENT_AMBULATORY_CARE_PROVIDER_SITE_OTHER): Payer: No Typology Code available for payment source | Admitting: Family Medicine

## 2022-11-15 VITALS — BP 129/70 | HR 63 | Temp 97.9°F | Ht 66.75 in | Wt 209.5 lb

## 2022-11-15 DIAGNOSIS — E1169 Type 2 diabetes mellitus with other specified complication: Secondary | ICD-10-CM | POA: Diagnosis not present

## 2022-11-15 DIAGNOSIS — Z792 Long term (current) use of antibiotics: Secondary | ICD-10-CM

## 2022-11-15 DIAGNOSIS — Z79899 Other long term (current) drug therapy: Secondary | ICD-10-CM | POA: Diagnosis not present

## 2022-11-15 DIAGNOSIS — Z0000 Encounter for general adult medical examination without abnormal findings: Secondary | ICD-10-CM

## 2022-11-15 DIAGNOSIS — Z23 Encounter for immunization: Secondary | ICD-10-CM | POA: Diagnosis not present

## 2022-11-15 DIAGNOSIS — E119 Type 2 diabetes mellitus without complications: Secondary | ICD-10-CM

## 2022-11-15 DIAGNOSIS — E559 Vitamin D deficiency, unspecified: Secondary | ICD-10-CM

## 2022-11-15 DIAGNOSIS — E6609 Other obesity due to excess calories: Secondary | ICD-10-CM

## 2022-11-15 DIAGNOSIS — K0889 Other specified disorders of teeth and supporting structures: Secondary | ICD-10-CM

## 2022-11-15 DIAGNOSIS — I10 Essential (primary) hypertension: Secondary | ICD-10-CM | POA: Diagnosis not present

## 2022-11-15 DIAGNOSIS — E538 Deficiency of other specified B group vitamins: Secondary | ICD-10-CM

## 2022-11-15 DIAGNOSIS — Z7984 Long term (current) use of oral hypoglycemic drugs: Secondary | ICD-10-CM

## 2022-11-15 DIAGNOSIS — E785 Hyperlipidemia, unspecified: Secondary | ICD-10-CM

## 2022-11-15 MED ORDER — FLUCONAZOLE 150 MG PO TABS: 150.0000 mg | ORAL_TABLET | Freq: Once | ORAL | 1 refills | Status: AC | PRN

## 2022-11-15 MED ORDER — AMOXICILLIN 500 MG PO CAPS: ORAL_CAPSULE | ORAL | 3 refills | Status: DC

## 2022-11-15 NOTE — Patient Instructions (Addendum)
Td shot today   Keep walking  Add some strength training to your routine, this is important for bone and brain health and can reduce your risk of falls and help your body use insulin properly and regulate weight  Light weights, exercise bands , and internet videos are a good way to start  Yoga (chair or regular), machines , floor exercises or a gym with machines are also good options   Take care of yourself   Try to get most of your carbohydrates from produce (with the exception of white potatoes) and whole grains Eat less bread/pasta/rice/snack foods/cereals/sweets and other items from the middle of the grocery store (processed carbs)  I sent in amoxicillin and diflucan for your upcoming dental work   Tetanus shot today

## 2022-11-15 NOTE — Assessment & Plan Note (Signed)
Disc goals for lipids and reasons to control them Rev last labs with pt Rev low sat fat diet in detail LDL of 66 Crestor 10 mg daily /will continue this and diet

## 2022-11-15 NOTE — Assessment & Plan Note (Signed)
Amox sent in for upcoming appts  Has history of heart surgery and requires this  Diflucan sent in case yeast from antibiotic

## 2022-11-15 NOTE — Assessment & Plan Note (Signed)
Reviewed health habits including diet and exercise and skin cancer prevention Reviewed appropriate screening tests for age  Also reviewed health mt list, fam hx and immunization status , as well as social and family history   See HPI Labs reviewed and ordered Eye exam upcoming  Td given  Mammogram utd 08/2022 Colonoscopy 2022 with 5 y recall  Discussed bone health  PHQ 0

## 2022-11-15 NOTE — Assessment & Plan Note (Signed)
Much iproved after tooth ext  Feels much better

## 2022-11-15 NOTE — Assessment & Plan Note (Signed)
Lab Results  Component Value Date   VITAMINB12 692 11/12/2022   Last vitamin D Lab Results  Component Value Date   VD25OH 42.19 11/12/2022   Watching renal status Encouraged fluds

## 2022-11-15 NOTE — Assessment & Plan Note (Signed)
Improved Lab Results  Component Value Date   HGBA1C 6.6 (H) 11/12/2022   Taking metformin xr 500 mg daily  Also losing weight  Encouraged low glycemic diet and add some strength training when able On statin  Cannot take ace Microalb utd  Needs eye exam- is scheduled a week from today

## 2022-11-15 NOTE — Assessment & Plan Note (Signed)
Last vitamin D Lab Results  Component Value Date   VD25OH 42.19 11/12/2022    Enc her to continue 2000 iu D3 daily  For bone and overall healt

## 2022-11-15 NOTE — Assessment & Plan Note (Signed)
 Discussed how this problem influences overall health and the risks it imposes  Reviewed plan for weight loss with lower calorie diet (via better food choices (lower glycemic and portion control) along with exercise building up to or more than 30 minutes 5 days per week including some aerobic activity and strength training

## 2022-11-15 NOTE — Assessment & Plan Note (Signed)
Lab Results  Component Value Date   VITAMINB12 692 11/12/2022   Better on vit B12 500 mcg daily

## 2022-11-15 NOTE — Progress Notes (Signed)
Subjective:    Patient ID: Crystal Tucker, female    DOB: [birth year], 62 y.o.   MRN: [id number]  HPI  Here for health maintenance exam and to review chronic medical problems   Wt Readings from Last 3 Encounters:  11/15/22 209 lb 8 oz (95 kg)  10/28/22 211 lb 4 oz (95.8 kg)  07/13/22 213 lb (96.6 kg)   33.06 kg/m  Vitals:   11/15/22 1041 11/15/22 1110  BP: (!) 146/74 129/70  Pulse: 63   Temp: 97.9 F (36.6 C)   SpO2: 100%     Immunization History  Administered Date(s) Administered   Influenza Whole 02/08/2005   Influenza, Seasonal, Injecte, Preservative Fre 01/07/2022   Influenza,inj,Quad PF,6+ Mos 02/20/2013   Influenza-Unspecified 11/24/2018, 11/07/2020   Moderna Sars-Covid-2 Vaccination 03/05/2019, 04/02/2019   Td 11/15/2022   Tdap 11/10/2011   Zoster Recombinant(Shingrix) 07/30/2020, 10/15/2020    Health Maintenance Due  Topic Date Due   OPHTHALMOLOGY EXAM  Never done    Needs antibiotic proph for dental work (and diflucan for yeast that it may cause)  Has history of heart surgery in past and this was recommended Has 3 dental proceedures coming up  Had extraction (feels so much better-pain is gone)  Then cleaning    Eye exam: - is scheduled a week from today   Tetanus shot - due / will get today   Flu shot - will get at work    Mammogram 08/2022  Self breast exam-no lumps   Gyn health Hysterectomy in the past    Colon cancer screening -colonoscopy 01/2021 with 5 y recall Takes fiber and probiotic yogurt  Managing some constipation   Bone health   Falls-none  Fractures-none  Supplements  Last vitamin D Lab Results  Component Value Date   VD25OH 42.19 11/12/2022    Exercise : walking regularly    Mood    11/15/2022   10:51 AM 10/28/2022    8:02 AM 04/14/2022    3:35 PM 03/26/2022    3:17 PM 10/12/2021    3:21 PM  Depression screen PHQ 2/9  Decreased Interest 0 0 0 0 0  Down, Depressed, Hopeless 0 0 0 0 0  PHQ - 2 Score 0 0 0 0 0   Altered sleeping 0 0 0 0   Tired, decreased energy 0 0 0 0   Change in appetite 2 1 0 0   Feeling bad or failure about yourself  0 0 0 0   Trouble concentrating 0 0 0 0   Moving slowly or fidgety/restless 0 0 0 0   Suicidal thoughts 0 0 0 0   PHQ-9 Score 2 1 0 0   Difficult doing work/chores Not difficult at all Not difficult at all Not difficult at all Not difficult at all   Appetite change is due to metformin    HTN bp is stable today  No cp or palpitations or headaches or edema  No side effects to medicines  BP Readings from Last 3 Encounters:  11/15/22 129/70  10/28/22 (!) 159/88  07/13/22 121/75    Pulse Readings from Last 3 Encounters:  11/15/22 63  10/28/22 66  07/13/22 65   Amlodipine 5 mg daily  No issues at home   Has cpap for OSA  DM2 Lab Results  Component Value Date   HGBA1C 6.6 (H) 11/12/2022   Down from 6.7  Metformin xr 500 mg daily  Eating less in general / smaller portions  Making better  choices     Lab Results  Component Value Date   MICROALBUR 3.3 (H) 11/12/2022   MICROALBUR 2.0 (H) 10/12/2021    Hyperlipidemia Lab Results  Component Value Date   CHOL 147 11/12/2022   CHOL 138 07/06/2022   CHOL 186 04/07/2022   Lab Results  Component Value Date   HDL 69.80 11/12/2022   HDL 64.60 07/06/2022   HDL 51.10 04/07/2022   Lab Results  Component Value Date   LDLCALC 66 11/12/2022   LDLCALC 62 07/06/2022   LDLCALC 118 (H) 04/07/2022   Lab Results  Component Value Date   TRIG 55.0 11/12/2022   TRIG 58.0 07/06/2022   TRIG 85.0 04/07/2022   Lab Results  Component Value Date   CHOLHDL 2 11/12/2022   CHOLHDL 2 07/06/2022   CHOLHDL 4 04/07/2022   No results found for: "LDLDIRECT" Crestor 10 mg daily  LDL at goal    GERD Omeprazole 20 mg daily   Lab Results  Component Value Date   VITAMINB12 692 11/12/2022   Lab Results  Component Value Date   NA 141 11/12/2022   K 4.2 11/12/2022   CO2 28 11/12/2022   GLUCOSE 106  (H) 11/12/2022   BUN 15 11/12/2022   CREATININE 0.82 11/12/2022   CALCIUM 9.2 11/12/2022   GFR 76.56 11/12/2022   GFRNONAA >60 09/14/2021   Lab Results  Component Value Date   ALT 12 11/12/2022   AST 12 11/12/2022   ALKPHOS 53 11/12/2022   BILITOT 0.5 11/12/2022    Lab Results  Component Value Date   WBC 5.6 11/12/2022   HGB 12.3 11/12/2022   HCT 39.1 11/12/2022   MCV 82.3 11/12/2022   PLT 267.0 11/12/2022   Lab Results  Component Value Date   TSH 0.80 11/12/2022       Patient Active Problem List   Diagnosis Date Noted   Need for antibiotic prophylaxis for dental procedure 10/28/2022   Intertrigo 07/13/2022   Constipation 07/13/2022   Left ear pain 01/12/2022   Diabetes mellitus treated with oral medication (HCC) 01/12/2022   Routine general medical examination at a health care facility 10/12/2021   OA (osteoarthritis) of knee 01/07/2021   Vitamin B12 deficiency 07/29/2020   Vitamin D deficiency 07/29/2020   Colon cancer screening 07/29/2020   Current use of proton pump inhibitor 07/16/2020   Chronic pain of left thumb 04/10/2020   Flatus 10/31/2019   Dizziness 09/24/2019   History of heart surgery 09/24/2019   Pain of left hip joint 02/22/2019   Hemorrhoids 12/11/2018   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 07/05/2017   Hyperlipidemia associated with type 2 diabetes mellitus (HCC) 11/04/2016   Encounter for screening for HIV 11/03/2016   Encounter for hepatitis C screening test for low risk patient 11/03/2016   Encounter for screening mammogram for breast cancer 11/03/2015   Obesity 11/10/2011   Encounter for general adult medical examination with abnormal findings 11/01/2011   Allergic rhinitis 07/16/2011   OSA (obstructive sleep apnea) 07/16/2011   DEPENDENT EDEMA, LEGS, BILATERAL 10/11/2008   Essential hypertension 10/26/2006   Diverticulosis of large intestine 10/26/2006   COLONIC POLYPS, HX OF 10/26/2006   Past Medical  History:  Diagnosis Date   Allergy    Anemia    NOS   Arthritis    Cardiac valve prolapse    leaking valve with surgery 1998   Contact with or exposure to venereal diseases    Diverticulosis of colon (without mention of hemorrhage)  Edema    GERD (gastroesophageal reflux disease)    H/O: pneumonia    Hypertension    Pneumonia    Pruritus of genital organs    Rash and other nonspecific skin eruption    Sleep apnea    Tubular adenoma of colon    Unspecified symptom associated with female genital organs    Past Surgical History:  Procedure Laterality Date   CARDIAC VALVE SURGERY  1998   COLONOSCOPY  7/03   polyps and tics   DIAGNOSTIC LAPAROSCOPY     KNEE ARTHROSCOPY     right   KNEE SURGERY  10/04   LAPAROSCOPY N/A 11/12/2014   Procedure: LAPAROSCOPY DIAGNOSTIC;  Surgeon: Richarda Overlie, MD;  Location: [facility];  Service: Gynecology;  Laterality: N/A;   PARTIAL HYSTERECTOMY     POLYPECTOMY     RECTAL POLYPECTOMY  8/07   ROTATOR CUFF REPAIR Right    2   SALPINGOOPHORECTOMY Right 11/12/2014   Procedure: SALPINGO OOPHORECTOMY;  Surgeon: Richarda Overlie, MD;  Location: [facility];  Service: Gynecology;  Laterality: Right;   WRIST SURGERY Right    x 2   Social History   Tobacco Use   Smoking status: Former    Current packs/day: 0.00    Average packs/day: 0.8 packs/day for 30.0 years (24.0 ttl pk-yrs)    Types: Cigarettes    Start date: 03/01/1972    Quit date: 03/01/2002    Years since quitting: 20.7   Smokeless tobacco: Never  Vaping Use   Vaping status: Never Used  Substance Use Topics   Alcohol use: Not Currently   Drug use: No   Family History  Problem Relation Age of Onset   Colon polyps Mother    Diabetes Mother    Hypertension Mother    Diabetes Maternal Aunt    Heart disease Maternal Aunt    Heart disease Maternal Uncle    Cancer Paternal Aunt        type unknown   Cerebral aneurysm Paternal Aunt    Cancer Paternal Aunt        x 2 , types unknown    Heart disease Maternal Grandmother    Cerebral aneurysm Cousin    Colon cancer Neg Hx    Esophageal cancer Neg Hx    Rectal cancer Neg Hx    Stomach cancer Neg Hx    Pancreatic cancer Neg Hx    Breast cancer Neg Hx    Allergies  Allergen Reactions   Lisinopril     cough   Citrus Rash   Current Outpatient Medications on File Prior to Visit  Medication Sig Dispense Refill   Acetaminophen (TYLENOL ARTHRITIS EXT RELIEF PO) Take 2 tablets by mouth daily.     albuterol (PROAIR HFA) 108 (90 Base) MCG/ACT inhaler Inhale 2 puffs into the lungs every 6 (six) hours as needed for wheezing or shortness of breath. 18 g 5   amLODipine (NORVASC) 5 MG tablet Take 1 tablet by mouth once daily 90 tablet 0   cholecalciferol (VITAMIN D3) 25 MCG (1000 UNIT) tablet Take 2,000 Units by mouth daily.     metFORMIN (GLUCOPHAGE-XR) 500 MG 24 hr tablet Take 1 tablet (500 mg total) by mouth daily with breakfast. 90 tablet 3   mometasone (NASONEX) 50 MCG/ACT nasal spray Place 2 sprays into the nose daily. 1 each 12   omeprazole (PRILOSEC) 20 MG capsule Take 1 capsule by mouth once daily 90 capsule 1   potassium chloride SA (KLOR-CON M20) 20  MEQ tablet Take 1 tablet by mouth once daily 90 tablet 1   rosuvastatin (CRESTOR) 10 MG tablet Take 1 tablet (10 mg total) by mouth daily. 90 tablet 3   vitamin B-12 (CYANOCOBALAMIN) 500 MCG tablet Take 500 mcg by mouth daily.     No current facility-administered medications on file prior to visit.    Review of Systems  Constitutional:  Negative for activity change, appetite change, fatigue, fever and unexpected weight change.  HENT:  Negative for congestion, ear pain, rhinorrhea, sinus pressure and sore throat.   Eyes:  Negative for pain, redness and visual disturbance.  Respiratory:  Negative for cough, shortness of breath and wheezing.   Cardiovascular:  Negative for chest pain and palpitations.  Gastrointestinal:  Negative for abdominal pain, blood in stool,  constipation and diarrhea.  Endocrine: Negative for polydipsia and polyuria.  Genitourinary:  Negative for dysuria, frequency and urgency.  Musculoskeletal:  Negative for arthralgias, back pain and myalgias.  Skin:  Negative for pallor and rash.  Allergic/Immunologic: Negative for environmental allergies.  Neurological:  Negative for dizziness, syncope and headaches.  Hematological:  Negative for adenopathy. Does not bruise/bleed easily.  Psychiatric/Behavioral:  Negative for decreased concentration and dysphoric mood. The patient is not nervous/anxious.        Objective:   Physical Exam Constitutional:      General: She is not in acute distress.    Appearance: Normal appearance. She is well-developed. She is obese. She is not ill-appearing or diaphoretic.  HENT:     Head: Normocephalic and atraumatic.     Right Ear: Tympanic membrane, ear canal and external ear normal.     Left Ear: Tympanic membrane, ear canal and external ear normal.     Nose: Nose normal. No congestion.     Mouth/Throat:     Mouth: Mucous membranes are moist.     Pharynx: Oropharynx is clear. No posterior oropharyngeal erythema.  Eyes:     General: No scleral icterus.    Extraocular Movements: Extraocular movements intact.     Conjunctiva/sclera: Conjunctivae normal.     Pupils: Pupils are equal, round, and reactive to light.  Neck:     Thyroid: No thyromegaly.     Vascular: No carotid bruit or JVD.  Cardiovascular:     Rate and Rhythm: Normal rate and regular rhythm.     Pulses: Normal pulses.     Heart sounds: Normal heart sounds.     No gallop.  Pulmonary:     Effort: Pulmonary effort is normal. No respiratory distress.     Breath sounds: Normal breath sounds. No wheezing.     Comments: Good air exch Chest:     Chest wall: No tenderness.  Abdominal:     General: Bowel sounds are normal. There is no distension or abdominal bruit.     Palpations: Abdomen is soft. There is no mass.     Tenderness:  There is no abdominal tenderness.     Hernia: No hernia is present.  Genitourinary:    Comments: Breast exam: No mass, nodules, thickening, tenderness, bulging, retraction, inflamation, nipple discharge or skin changes noted.  No axillary or clavicular LA.     Musculoskeletal:        General: No tenderness. Normal range of motion.     Cervical back: Normal range of motion and neck supple. No rigidity. No muscular tenderness.     Right lower leg: No edema.     Left lower leg: No edema.  Comments: No kyphosis   Lymphadenopathy:     Cervical: No cervical adenopathy.  Skin:    General: Skin is warm and dry.     Coloration: Skin is not pale.     Findings: No erythema or rash.     Comments: Few lentigines and skin tags   Neurological:     Mental Status: She is alert. Mental status is at baseline.     Cranial Nerves: No cranial nerve deficit.     Motor: No abnormal muscle tone.     Coordination: Coordination normal.     Gait: Gait normal.     Deep Tendon Reflexes: Reflexes are normal and symmetric. Reflexes normal.  Psychiatric:        Mood and Affect: Mood normal.        Cognition and Memory: Cognition and memory normal.           Assessment & Plan:   Problem List Items Addressed This Visit       Cardiovascular and Mediastinum   Essential hypertension    Blood pressure is up today likely due to dental pain  BP: 129/70   Taking amlodipine 5 mg daily  Instructed to check at home when more relaxed and call if not improved so we can adj therapy         Endocrine   Diabetes mellitus treated with oral medication (HCC)    Improved Lab Results  Component Value Date   HGBA1C 6.6 (H) 11/12/2022   Taking metformin xr 500 mg daily  Also losing weight  Encouraged low glycemic diet and add some strength training when able On statin  Cannot take ace Microalb utd  Needs eye exam- is scheduled a week from today       Hyperlipidemia associated with type 2 diabetes mellitus  (HCC)    Disc goals for lipids and reasons to control them Rev last labs with pt Rev low sat fat diet in detail LDL of 66 Crestor 10 mg daily /will continue this and diet         Other   Current use of proton pump inhibitor    Lab Results  Component Value Date   VITAMINB12 692 11/12/2022   Last vitamin D Lab Results  Component Value Date   VD25OH 42.19 11/12/2022   Watching renal status Encouraged fluds       Need for antibiotic prophylaxis for dental procedure    Amox sent in for upcoming appts  Has history of heart surgery and requires this  Diflucan sent in case yeast from antibiotic       Obesity    Discussed how this problem influences overall health and the risks it imposes  Reviewed plan for weight loss with lower calorie diet (via better food choices (lower glycemic and portion control) along with exercise building up to or more than 30 minutes 5 days per week including some aerobic activity and strength training         RESOLVED: Pain, dental    Much iproved after tooth ext  Feels much better       Routine general medical examination at a health care facility - Primary    Reviewed health habits including diet and exercise and skin cancer prevention Reviewed appropriate screening tests for age  Also reviewed health mt list, fam hx and immunization status , as well as social and family history   See HPI Labs reviewed and ordered Eye exam upcoming  Td given  Mammogram utd  08/2022 Colonoscopy 2022 with 5 y recall  Discussed bone health  PHQ 0        Vitamin B12 deficiency    Lab Results  Component Value Date   VITAMINB12 692 11/12/2022   Better on vit B12 500 mcg daily      Vitamin D deficiency    Last vitamin D Lab Results  Component Value Date   VD25OH 42.19 11/12/2022    Enc her to continue 2000 iu D3 daily  For bone and overall healt      Other Visit Diagnoses     Need for Td vaccine       Relevant Orders   Td :  Tetanus/diphtheria >7yo Preservative  free (Completed)

## 2022-11-15 NOTE — Assessment & Plan Note (Signed)
Blood pressure is up today likely due to dental pain  BP: 129/70   Taking amlodipine 5 mg daily  Instructed to check at home when more relaxed and call if not improved so we can adj therapy

## 2022-11-17 ENCOUNTER — Other Ambulatory Visit: Payer: Self-pay | Admitting: Family Medicine

## 2022-11-30 ENCOUNTER — Other Ambulatory Visit: Payer: Self-pay | Admitting: Family Medicine

## 2022-12-20 ENCOUNTER — Ambulatory Visit (INDEPENDENT_AMBULATORY_CARE_PROVIDER_SITE_OTHER): Payer: No Typology Code available for payment source

## 2022-12-20 DIAGNOSIS — Z23 Encounter for immunization: Secondary | ICD-10-CM

## 2023-01-08 ENCOUNTER — Other Ambulatory Visit: Payer: Self-pay | Admitting: Family Medicine

## 2023-02-14 ENCOUNTER — Other Ambulatory Visit: Payer: Self-pay | Admitting: Family Medicine

## 2023-04-03 ENCOUNTER — Other Ambulatory Visit: Payer: Self-pay | Admitting: Family Medicine

## 2023-05-14 ENCOUNTER — Other Ambulatory Visit: Payer: Self-pay | Admitting: Family Medicine

## 2023-07-22 ENCOUNTER — Other Ambulatory Visit: Payer: Self-pay | Admitting: Family Medicine

## 2023-07-24 ENCOUNTER — Other Ambulatory Visit: Payer: Self-pay | Admitting: Family Medicine

## 2023-08-28 ENCOUNTER — Other Ambulatory Visit: Payer: Self-pay | Admitting: Family Medicine

## 2023-11-10 ENCOUNTER — Other Ambulatory Visit: Payer: Self-pay | Admitting: Family Medicine

## 2023-11-10 NOTE — Telephone Encounter (Signed)
 Pt is due for CPE (labs prior) on or after 11/16/23, please schedule then route to me to refill

## 2023-11-11 ENCOUNTER — Other Ambulatory Visit: Payer: Self-pay | Admitting: Family Medicine

## 2023-11-11 NOTE — Telephone Encounter (Signed)
 I called patient and she has been scheduled.

## 2023-11-14 ENCOUNTER — Other Ambulatory Visit: Payer: Self-pay | Admitting: Family Medicine

## 2023-11-14 NOTE — Telephone Encounter (Unsigned)
 Copied from CRM #[id number]. Topic: Clinical - Medication Refill >> Nov 14, 2023  9:10 AM Dawna HERO wrote: Medication: potassium chloride  SA (KLOR-CON  M20) 20 MEQ tablet amLODipine  (NORVASC ) 5 MG tablet rosuvastatin  (CRESTOR ) 10 MG tablet omeprazole  (PRILOSEC) 20 MG capsule  Has the patient contacted their pharmacy? Yes,told to contact provider (Agent: If no, request that the patient contact the pharmacy for the refill. If patient does not wish to contact the pharmacy document the reason why and proceed with request.) (Agent: If yes, when and what did the pharmacy advise?)  This is the patient's preferred pharmacy:  [facility] [address] [place] - [address] [place] [address] Phone: [phone] Fax: [phone]   Is this the correct pharmacy for this prescription? Yes If no, delete pharmacy and type the correct one.   Has the prescription been filled recently? No  Is the patient out of the medication? Yes  Has the patient been seen for an appointment in the last year OR does the patient have an upcoming appointment? Yes  Can we respond through MyChart? No  Agent: Please be advised that Rx refills may take up to 3 business days. We ask that you follow-up with your pharmacy.

## 2023-11-15 MED ORDER — OMEPRAZOLE 20 MG PO CPDR: 20.0000 mg | DELAYED_RELEASE_CAPSULE | Freq: Every day | ORAL | 0 refills | Status: DC

## 2023-11-15 MED ORDER — ROSUVASTATIN CALCIUM 10 MG PO TABS: 10.0000 mg | ORAL_TABLET | Freq: Every day | ORAL | 0 refills | Status: AC

## 2023-11-15 MED ORDER — AMLODIPINE BESYLATE 5 MG PO TABS: 5.0000 mg | ORAL_TABLET | Freq: Every day | ORAL | 0 refills | Status: DC

## 2023-11-15 MED ORDER — POTASSIUM CHLORIDE CRYS ER 20 MEQ PO TBCR: 20.0000 meq | EXTENDED_RELEASE_TABLET | Freq: Every day | ORAL | 0 refills | Status: DC

## 2023-12-11 ENCOUNTER — Telehealth: Payer: Self-pay | Admitting: Family Medicine

## 2023-12-11 DIAGNOSIS — E119 Type 2 diabetes mellitus without complications: Secondary | ICD-10-CM

## 2023-12-11 DIAGNOSIS — E559 Vitamin D deficiency, unspecified: Secondary | ICD-10-CM

## 2023-12-11 DIAGNOSIS — E538 Deficiency of other specified B group vitamins: Secondary | ICD-10-CM

## 2023-12-11 DIAGNOSIS — I10 Essential (primary) hypertension: Secondary | ICD-10-CM

## 2023-12-11 DIAGNOSIS — E1169 Type 2 diabetes mellitus with other specified complication: Secondary | ICD-10-CM

## 2023-12-11 NOTE — Telephone Encounter (Signed)
-----   Message from [name] Crystal Tucker sent at 12/01/2023  2:41 PM EDT ----- Regarding: Lab orders for Tue, 10.7.25 Patient is scheduled for CPX labs, please order future labs, Thanks , [name]

## 2023-12-13 ENCOUNTER — Ambulatory Visit: Payer: Self-pay | Admitting: Family Medicine

## 2023-12-13 ENCOUNTER — Other Ambulatory Visit: Payer: Self-pay

## 2023-12-13 DIAGNOSIS — I10 Essential (primary) hypertension: Secondary | ICD-10-CM

## 2023-12-13 DIAGNOSIS — E538 Deficiency of other specified B group vitamins: Secondary | ICD-10-CM

## 2023-12-13 DIAGNOSIS — E119 Type 2 diabetes mellitus without complications: Secondary | ICD-10-CM

## 2023-12-13 DIAGNOSIS — E559 Vitamin D deficiency, unspecified: Secondary | ICD-10-CM

## 2023-12-13 DIAGNOSIS — E785 Hyperlipidemia, unspecified: Secondary | ICD-10-CM

## 2023-12-13 DIAGNOSIS — E1169 Type 2 diabetes mellitus with other specified complication: Secondary | ICD-10-CM

## 2023-12-13 DIAGNOSIS — Z7984 Long term (current) use of oral hypoglycemic drugs: Secondary | ICD-10-CM

## 2023-12-13 LAB — VITAMIN B12: Vitamin B-12: 648 pg/mL (ref 211–911)

## 2023-12-13 LAB — COMPREHENSIVE METABOLIC PANEL WITH GFR
ALT: 11 U/L (ref 0–35)
AST: 12 U/L (ref 0–37)
Albumin: 4.3 g/dL (ref 3.5–5.2)
Alkaline Phosphatase: 48 U/L (ref 39–117)
BUN: 17 mg/dL (ref 6–23)
CO2: 31 meq/L (ref 19–32)
Calcium: 9.5 mg/dL (ref 8.4–10.5)
Chloride: 103 meq/L (ref 96–112)
Creatinine, Ser: 0.76 mg/dL (ref 0.40–1.20)
GFR: 83.23 mL/min (ref >60.00)
Glucose, Bld: 100 mg/dL — ABNORMAL HIGH (ref 70–99)
Potassium: 4.1 meq/L (ref 3.5–5.1)
Sodium: 141 meq/L (ref 135–145)
Total Bilirubin: 0.4 mg/dL (ref 0.2–1.2)
Total Protein: 6.9 g/dL (ref 6.0–8.3)

## 2023-12-13 LAB — CBC WITH DIFFERENTIAL/PLATELET
Basophils Absolute: 0 K/uL (ref 0.0–0.1)
Basophils Relative: 0.8 % (ref 0.0–3.0)
Eosinophils Absolute: 0.1 K/uL (ref 0.0–0.7)
Eosinophils Relative: 1.8 % (ref 0.0–5.0)
HCT: 38.5 % (ref 36.0–46.0)
Hemoglobin: 12.4 g/dL (ref 12.0–15.0)
Lymphocytes Relative: 42.8 % (ref 12.0–46.0)
Lymphs Abs: 2.2 K/uL (ref 0.7–4.0)
MCHC: 32.2 g/dL (ref 30.0–36.0)
MCV: 81.2 fl (ref 78.0–100.0)
Monocytes Absolute: 0.3 K/uL (ref 0.1–1.0)
Monocytes Relative: 6.6 % (ref 3.0–12.0)
Neutro Abs: 2.5 K/uL (ref 1.4–7.7)
Neutrophils Relative %: 48 % (ref 43.0–77.0)
Platelets: 275 K/uL (ref 150.0–400.0)
RBC: 4.74 Mil/uL (ref 3.87–5.11)
RDW: 14.1 % (ref 11.5–15.5)
WBC: 5.2 K/uL (ref 4.0–10.5)

## 2023-12-13 LAB — HEMOGLOBIN A1C: Hgb A1c MFr Bld: 6.7 % — ABNORMAL HIGH (ref 4.6–6.5)

## 2023-12-13 LAB — LIPID PANEL
Cholesterol: 151 mg/dL (ref 0–200)
HDL: 63.5 mg/dL (ref >39.00)
LDL Cholesterol: 73 mg/dL (ref 0–99)
NonHDL: 87.84
Total CHOL/HDL Ratio: 2
Triglycerides: 73 mg/dL (ref 0.0–149.0)
VLDL: 14.6 mg/dL (ref 0.0–40.0)

## 2023-12-13 LAB — VITAMIN D 25 HYDROXY (VIT D DEFICIENCY, FRACTURES): VITD: 30.68 ng/mL (ref 30.00–100.00)

## 2023-12-13 LAB — TSH: TSH: 1.03 u[IU]/mL (ref 0.35–5.50)

## 2023-12-13 NOTE — Addendum Note (Signed)
 Addended by: LORELLE ROCKY BRAVO on: 12/13/2023 08:40 AM   Modules accepted: Orders

## 2023-12-14 ENCOUNTER — Other Ambulatory Visit: Payer: Self-pay

## 2023-12-14 DIAGNOSIS — E119 Type 2 diabetes mellitus without complications: Secondary | ICD-10-CM

## 2023-12-14 DIAGNOSIS — E1169 Type 2 diabetes mellitus with other specified complication: Secondary | ICD-10-CM

## 2023-12-14 DIAGNOSIS — E538 Deficiency of other specified B group vitamins: Secondary | ICD-10-CM

## 2023-12-14 DIAGNOSIS — E559 Vitamin D deficiency, unspecified: Secondary | ICD-10-CM

## 2023-12-14 DIAGNOSIS — I10 Essential (primary) hypertension: Secondary | ICD-10-CM

## 2023-12-15 ENCOUNTER — Ambulatory Visit: Payer: Self-pay | Admitting: Family Medicine

## 2023-12-15 LAB — MICROALBUMIN / CREATININE URINE RATIO
Creatinine,U: 246 mg/dL
Microalb Creat Ratio: 10.9 mg/g (ref 0.0–30.0)
Microalb, Ur: 2.7 mg/dL — ABNORMAL HIGH (ref 0.0–1.9)

## 2023-12-20 ENCOUNTER — Encounter: Payer: Self-pay | Admitting: Family Medicine

## 2023-12-20 ENCOUNTER — Ambulatory Visit: Payer: Self-pay | Admitting: Family Medicine

## 2023-12-20 VITALS — BP 131/80 | HR 71 | Temp 98.3°F | Ht 66.75 in | Wt 219.5 lb

## 2023-12-20 DIAGNOSIS — Z7984 Long term (current) use of oral hypoglycemic drugs: Secondary | ICD-10-CM

## 2023-12-20 DIAGNOSIS — E119 Type 2 diabetes mellitus without complications: Secondary | ICD-10-CM

## 2023-12-20 DIAGNOSIS — Z1231 Encounter for screening mammogram for malignant neoplasm of breast: Secondary | ICD-10-CM

## 2023-12-20 DIAGNOSIS — E559 Vitamin D deficiency, unspecified: Secondary | ICD-10-CM

## 2023-12-20 DIAGNOSIS — E6609 Other obesity due to excess calories: Secondary | ICD-10-CM

## 2023-12-20 DIAGNOSIS — E538 Deficiency of other specified B group vitamins: Secondary | ICD-10-CM

## 2023-12-20 DIAGNOSIS — Z79899 Other long term (current) drug therapy: Secondary | ICD-10-CM

## 2023-12-20 DIAGNOSIS — Z0000 Encounter for general adult medical examination without abnormal findings: Secondary | ICD-10-CM

## 2023-12-20 DIAGNOSIS — I10 Essential (primary) hypertension: Secondary | ICD-10-CM

## 2023-12-20 DIAGNOSIS — Z1211 Encounter for screening for malignant neoplasm of colon: Secondary | ICD-10-CM

## 2023-12-20 DIAGNOSIS — E785 Hyperlipidemia, unspecified: Secondary | ICD-10-CM

## 2023-12-20 DIAGNOSIS — E1169 Type 2 diabetes mellitus with other specified complication: Secondary | ICD-10-CM

## 2023-12-20 DIAGNOSIS — E66811 Obesity, class 1: Secondary | ICD-10-CM

## 2023-12-20 MED ORDER — AMOXICILLIN 500 MG PO CAPS: ORAL_CAPSULE | ORAL | 3 refills | Status: AC

## 2023-12-20 NOTE — Assessment & Plan Note (Signed)
 Lab Results  Component Value Date   VITAMINB12 648 12/13/2023   Better on vit B12 500 mcg daily

## 2023-12-20 NOTE — Assessment & Plan Note (Signed)
 Reviewed health habits including diet and exercise and skin cancer prevention Reviewed appropriate screening tests for age  Also reviewed health mt list, fam hx and immunization status , as well as social and family history   See HPI Labs reviewed and ordered Health Maintenance  Topic Date Due   Eye exam for diabetics  Never done   Pneumococcal Vaccine for age over 75 (1 of 2 - PCV) Never done   Breast Cancer Screening  09/02/2023   Complete foot exam   11/15/2023   Flu Shot  06/05/2024*   COVID-19 Vaccine (3 - Moderna risk series) 01/04/2025*   Hemoglobin A1C  06/12/2024   Yearly kidney function blood test for diabetes  12/12/2024   Yearly kidney health urinalysis for diabetes  12/13/2024   Colon Cancer Screening  01/22/2026   DTaP/Tdap/Td vaccine (3 - Td or Tdap) 11/14/2032   Hepatitis C Screening  Completed   HIV Screening  Completed   Zoster (Shingles) Vaccine  Completed   Hepatitis B Vaccine  Aged Out   HPV Vaccine  Aged Out   Meningitis B Vaccine  Aged Out  *Topic was postponed. The date shown is not the original due date.    Will get lower cost flu shot at pharm or health dept  Filled out form for low cost mammogram Declines std screening  Discussed fall prevention, supplements and exercise for bone density  PHQ 2 (had to leave job due to health problems)  Sent for eye exam report

## 2023-12-20 NOTE — Assessment & Plan Note (Signed)
 Disc goals for lipids and reasons to control them Rev last labs with pt Rev low sat fat diet in detail  Crestor  10 mg daily  LDL of 73

## 2023-12-20 NOTE — Assessment & Plan Note (Signed)
 Omeprazole  20   Lab Results  Component Value Date   VITAMINB12 648 12/13/2023   Last vitamin D  Lab Results  Component Value Date   VD25OH 30.68 12/13/2023   addressed

## 2023-12-20 NOTE — Patient Instructions (Addendum)
 Get your flu shot at the pharmacy or health dept  If you want STD screening-go to the health dept   For bone health  Go up on your vitamin D3 to 4000 international units daily (currently on 2000)   Look into getting a pedaler (seated) for exercise  A lot less expensive than a bike  Also  Add some strength training to your routine, this is important for bone and brain health and can reduce your risk of falls and help your body use insulin properly and regulate weight  Light weights, exercise bands , and internet videos are a good way to start  Yoga (chair or regular), machines , floor exercises or a gym with machines are also good options    Check your blood pressure at home and call/let us  know how they are New goal is under 130/80 for diabetics  I sent in amoxicillin  for your dental procedure

## 2023-12-20 NOTE — Assessment & Plan Note (Signed)
 Given form to fill out for the mammogram scholarship program with [facility] to see if reduced cost mammo is option for pt w/o ins

## 2023-12-20 NOTE — Assessment & Plan Note (Signed)
 Last vitamin D  Lab Results  Component Value Date   VD25OH 30.68 12/13/2023   Instructed to increase supplement from 2000 to 4000 international units daily

## 2023-12-20 NOTE — Assessment & Plan Note (Signed)
 Discussed how this problem influences overall health and the risks it imposes  Reviewed plan for weight loss with lower calorie diet (via better food choices (lower glycemic and portion control) along with exercise building up to or more than 30 minutes 5 days per week including some aerobic activity and strength training

## 2023-12-20 NOTE — Progress Notes (Signed)
 Subjective:    Patient ID: Crystal Tucker, female    DOB: 08-23-1960, 63 y.o.   MRN: [id number]  HPI  Here for health maintenance exam and to review chronic medical problems   Wt Readings from Last 3 Encounters:  12/20/23 219 lb 8 oz (99.6 kg)  11/15/22 209 lb 8 oz (95 kg)  10/28/22 211 lb 4 oz (95.8 kg)   34.64 kg/m  Vitals:   12/20/23 1125 12/20/23 1157  BP: (!) 156/92 131/80  Pulse: 71   Temp: 98.3 F (36.8 C)   SpO2: 97%     Immunization History  Administered Date(s) Administered   Influenza Whole 02/08/2005   Influenza, Seasonal, Injecte, Preservative Fre 01/07/2022, 12/20/2022   Influenza,inj,Quad PF,6+ Mos 02/20/2013   Influenza-Unspecified 11/24/2018, 11/07/2020   Moderna Sars-Covid-2 Vaccination 03/05/2019, 04/02/2019   Td 11/15/2022   Tdap 11/10/2011   Zoster Recombinant(Shingrix) 07/30/2020, 10/15/2020    Health Maintenance Due  Topic Date Due   OPHTHALMOLOGY EXAM  Never done   Pneumococcal Vaccine: 50+ Years (1 of 2 - PCV) Never done   Mammogram  09/02/2023   FOOT EXAM  11/15/2023   Retired -part time  Hands and ortho issues - could not do full time   Flu shot (no insurance)   Mammogram-wants but needs low cost option  Self breast exam-no lumps   Gyn health Hysterectomy  Has new boyfriend -declines std screen    Colon cancer screening - colonoscopy 01/2021 with 5 y recall   Bone health   Falls-none  Fractures-none  Supplements  Last vitamin D  Lab Results  Component Value Date   VD25OH 30.68 12/13/2023    Exercise  Walking a little  Arthritis keeps her from this (right knee several surgeries)   Gained 10 lb  Eats well most of the time  Has no appetite at times  Drinks a lot of water   Hands really help  Using voltaren   Had surgery on right wrist and shoulder ([facility])    Mood    11/15/2022   10:51 AM 10/28/2022    8:02 AM 04/14/2022    3:35 PM 03/26/2022    3:17 PM 10/12/2021    3:21 PM  Depression screen PHQ  2/9  Decreased Interest 0 0 0 0 0  Down, Depressed, Hopeless 0 0 0 0 0  PHQ - 2 Score 0 0 0 0 0  Altered sleeping 0 0 0 0   Tired, decreased energy 0 0 0 0   Change in appetite 2 1 0 0   Feeling bad or failure about yourself  0 0 0 0   Trouble concentrating 0 0 0 0   Moving slowly or fidgety/restless 0 0 0 0   Suicidal thoughts 0 0 0 0   PHQ-9 Score 2 1 0 0   Difficult doing work/chores Not difficult at all Not difficult at all Not difficult at all Not difficult at all    HTN bp is stable today  No cp or palpitations or headaches or edema  No side effects to medicines  BP Readings from Last 3 Encounters:  12/20/23 131/80  11/15/22 129/70  10/28/22 (!) 159/88    Pulse Readings from Last 3 Encounters:  12/20/23 71  11/15/22 63  10/28/22 66   Has not checked at home recently    Lab Results  Component Value Date   NA 141 12/13/2023   K 4.1 12/13/2023   CO2 31 12/13/2023   GLUCOSE 100 (H)  12/13/2023   BUN 17 12/13/2023   CREATININE 0.76 12/13/2023   CALCIUM  9.5 12/13/2023   GFR 83.23 12/13/2023   GFRNONAA >60 09/14/2021   Amlodipine  5 mg daily   DM2 Lab Results  Component Value Date   HGBA1C 6.7 (H) 12/13/2023   HGBA1C 6.6 (H) 11/12/2022   HGBA1C 6.7 (H) 07/06/2022   Metfomrin xr 500 mg daily  On statin  Cannot take ace   Eye exam - had it recently - sending for note  No retinopathy   Lab Results  Component Value Date   MICROALBUR 2.7 (H) 12/14/2023  Ratio is normal   B12 def Lab Results  Component Value Date   VITAMINB12 648 12/13/2023   Hyperlipidemia Lab Results  Component Value Date   CHOL 151 12/13/2023   CHOL 147 11/12/2022   CHOL 138 07/06/2022   Lab Results  Component Value Date   HDL 63.50 12/13/2023   HDL 69.80 11/12/2022   HDL 64.60 07/06/2022   Lab Results  Component Value Date   LDLCALC 73 12/13/2023   LDLCALC 66 11/12/2022   LDLCALC 62 07/06/2022   Lab Results  Component Value Date   TRIG 73.0 12/13/2023   TRIG 55.0  11/12/2022   TRIG 58.0 07/06/2022   Lab Results  Component Value Date   CHOLHDL 2 12/13/2023   CHOLHDL 2 11/12/2022   CHOLHDL 2 07/06/2022   No results found for: LDLDIRECT Crestor  10 mg daily  LDL very close to goal  Diet is pretty good Lab Results  Component Value Date   ALT 11 12/13/2023   AST 12 12/13/2023   ALKPHOS 48 12/13/2023   BILITOT 0.4 12/13/2023       Patient Active Problem List   Diagnosis Date Noted   Need for antibiotic prophylaxis for dental procedure 10/28/2022   Intertrigo 07/13/2022   Constipation 07/13/2022   Left ear pain 01/12/2022   Diabetes mellitus treated with oral medication (HCC) 01/12/2022   Routine general medical examination at a health care facility 10/12/2021   OA (osteoarthritis) of knee 01/07/2021   Vitamin B12 deficiency 07/29/2020   Vitamin D  deficiency 07/29/2020   Colon cancer screening 07/29/2020   Current use of proton pump inhibitor 07/16/2020   Chronic pain of left thumb 04/10/2020   Flatus 10/31/2019   Dizziness 09/24/2019   History of heart surgery 09/24/2019   Pain of left hip joint 02/22/2019   Hemorrhoids 12/11/2018   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 07/05/2017   Hyperlipidemia associated with type 2 diabetes mellitus (HCC) 11/04/2016   Encounter for screening for HIV 11/03/2016   Encounter for hepatitis C screening test for low risk patient 11/03/2016   Encounter for screening mammogram for breast cancer 11/03/2015   Obesity 11/10/2011   Encounter for general adult medical examination with abnormal findings 11/01/2011   Allergic rhinitis 07/16/2011   OSA (obstructive sleep apnea) 07/16/2011   DEPENDENT EDEMA, LEGS, BILATERAL 10/11/2008   Essential hypertension 10/26/2006   Diverticulosis of large intestine 10/26/2006   History of colonic polyps 10/26/2006   Past Medical History:  Diagnosis Date   Allergy    Anemia    NOS   Arthritis    Cardiac valve prolapse    leaking  valve with surgery 1998   Contact with or exposure to venereal diseases    Diverticulosis of colon (without mention of hemorrhage)    Edema    GERD (gastroesophageal reflux disease)    H/O: pneumonia    Hypertension  Pneumonia    Pruritus of genital organs    Rash and other nonspecific skin eruption    Sleep apnea    Tubular adenoma of colon    Unspecified symptom associated with female genital organs    Past Surgical History:  Procedure Laterality Date   CARDIAC VALVE SURGERY  1998   COLONOSCOPY  7/03   polyps and tics   DIAGNOSTIC LAPAROSCOPY     KNEE ARTHROSCOPY     right   KNEE SURGERY  10/04   LAPAROSCOPY N/A 11/12/2014   Procedure: LAPAROSCOPY DIAGNOSTIC;  Surgeon: Charlie Croak, MD;  Location: [facility];  Service: Gynecology;  Laterality: N/A;   PARTIAL HYSTERECTOMY     POLYPECTOMY     RECTAL POLYPECTOMY  8/07   ROTATOR CUFF REPAIR Right    2   SALPINGOOPHORECTOMY Right 11/12/2014   Procedure: SALPINGO OOPHORECTOMY;  Surgeon: Charlie Croak, MD;  Location: [facility];  Service: Gynecology;  Laterality: Right;   WRIST SURGERY Right    x 2   Social History   Tobacco Use   Smoking status: Former    Current packs/day: 0.00    Average packs/day: 0.8 packs/day for 30.0 years (24.0 ttl pk-yrs)    Types: Cigarettes    Start date: 03/01/1972    Quit date: 03/01/2002    Years since quitting: 21.8   Smokeless tobacco: Never  Vaping Use   Vaping status: Never Used  Substance Use Topics   Alcohol use: Not Currently   Drug use: No   Family History  Problem Relation Age of Onset   Colon polyps Mother    Diabetes Mother    Hypertension Mother    Diabetes Maternal Aunt    Heart disease Maternal Aunt    Heart disease Maternal Uncle    Cancer Paternal Aunt        type unknown   Cerebral aneurysm Paternal Aunt    Cancer Paternal Aunt        x 2 , types unknown   Heart disease Maternal Grandmother    Cerebral aneurysm Cousin    Colon cancer Neg Hx    Esophageal cancer  Neg Hx    Rectal cancer Neg Hx    Stomach cancer Neg Hx    Pancreatic cancer Neg Hx    Breast cancer Neg Hx    Allergies  Allergen Reactions   Lisinopril      cough   Citrus Rash   Current Outpatient Medications on File Prior to Visit  Medication Sig Dispense Refill   Acetaminophen  (TYLENOL  ARTHRITIS EXT RELIEF PO) Take 2 tablets by mouth daily.     albuterol  (PROAIR  HFA) 108 (90 Base) MCG/ACT inhaler Inhale 2 puffs into the lungs every 6 (six) hours as needed for wheezing or shortness of breath. 18 g 5   amLODipine  (NORVASC ) 5 MG tablet Take 1 tablet (5 mg total) by mouth daily. 90 tablet 0   cholecalciferol (VITAMIN D3) 25 MCG (1000 UNIT) tablet Take 2,000 Units by mouth daily.     fluconazole  (DIFLUCAN ) 150 MG tablet Take 1 tablet (150 mg total) by mouth Once PRN (yeast infection). 3 tablet 1   metFORMIN  (GLUCOPHAGE -XR) 500 MG 24 hr tablet Take 1 tablet by mouth once daily with breakfast 90 tablet 2   mometasone  (NASONEX ) 50 MCG/ACT nasal spray Place 2 sprays into the nose daily. 1 each 12   omeprazole  (PRILOSEC) 20 MG capsule Take 1 capsule (20 mg total) by mouth daily. 90 capsule 0   potassium chloride   SA (KLOR-CON  M20) 20 MEQ tablet Take 1 tablet (20 mEq total) by mouth daily. 90 tablet 0   rosuvastatin  (CRESTOR ) 10 MG tablet Take 1 tablet (10 mg total) by mouth daily. 90 tablet 0   vitamin B-12 (CYANOCOBALAMIN ) 500 MCG tablet Take 500 mcg by mouth daily.     No current facility-administered medications on file prior to visit.    Review of Systems  Constitutional:  Negative for activity change, appetite change, fatigue, fever and unexpected weight change.  HENT:  Positive for dental problem. Negative for congestion, ear pain, rhinorrhea, sinus pressure and sore throat.   Eyes:  Negative for pain, redness and visual disturbance.  Respiratory:  Negative for cough, shortness of breath and wheezing.   Cardiovascular:  Negative for chest pain and palpitations.  Gastrointestinal:   Negative for abdominal pain, blood in stool, constipation and diarrhea.  Endocrine: Negative for polydipsia and polyuria.  Genitourinary:  Negative for dysuria, frequency and urgency.  Musculoskeletal:  Positive for arthralgias. Negative for back pain and myalgias.       Wrist and hand pain bilateral   Skin:  Negative for pallor and rash.  Allergic/Immunologic: Negative for environmental allergies.  Neurological:  Negative for dizziness, syncope and headaches.  Hematological:  Negative for adenopathy. Does not bruise/bleed easily.  Psychiatric/Behavioral:  Negative for decreased concentration and dysphoric mood. The patient is not nervous/anxious.        Objective:   Physical Exam Constitutional:      General: She is not in acute distress.    Appearance: Normal appearance. She is well-developed. She is obese. She is not ill-appearing or diaphoretic.  HENT:     Head: Normocephalic and atraumatic.     Right Ear: Tympanic membrane, ear canal and external ear normal.     Left Ear: Tympanic membrane, ear canal and external ear normal.     Nose: Nose normal. No congestion.     Mouth/Throat:     Mouth: Mucous membranes are moist.     Pharynx: Oropharynx is clear. No posterior oropharyngeal erythema.  Eyes:     General: No scleral icterus.    Extraocular Movements: Extraocular movements intact.     Conjunctiva/sclera: Conjunctivae normal.     Pupils: Pupils are equal, round, and reactive to light.  Neck:     Thyroid : No thyromegaly.     Vascular: No carotid bruit or JVD.  Cardiovascular:     Rate and Rhythm: Normal rate and regular rhythm.     Pulses: Normal pulses.     Heart sounds: Murmur heard.     No gallop.  Pulmonary:     Effort: Pulmonary effort is normal. No respiratory distress.     Breath sounds: Normal breath sounds. No wheezing.     Comments: Good air exch Chest:     Chest wall: No tenderness.  Abdominal:     General: Bowel sounds are normal. There is no distension  or abdominal bruit.     Palpations: Abdomen is soft. There is no mass.     Tenderness: There is no abdominal tenderness.     Hernia: No hernia is present.  Genitourinary:    Comments: Breast exam: No mass, nodules, thickening, tenderness, bulging, retraction, inflamation, nipple discharge or skin changes noted.  No axillary or clavicular LA.     Musculoskeletal:        General: No tenderness. Normal range of motion.     Cervical back: Normal range of motion and neck supple. No rigidity. No  muscular tenderness.     Right lower leg: No edema.     Left lower leg: No edema.     Comments: No kyphosis   No joint deformities in hands  Lymphadenopathy:     Cervical: No cervical adenopathy.  Skin:    General: Skin is warm and dry.     Coloration: Skin is not pale.     Findings: No erythema or rash.  Neurological:     Mental Status: She is alert. Mental status is at baseline.     Cranial Nerves: No cranial nerve deficit.     Motor: No abnormal muscle tone.     Coordination: Coordination normal.     Gait: Gait normal.     Deep Tendon Reflexes: Reflexes are normal and symmetric. Reflexes normal.  Psychiatric:        Mood and Affect: Mood normal.        Cognition and Memory: Cognition and memory normal.           Assessment & Plan:   Problem List Items Addressed This Visit       Cardiovascular and Mediastinum   Essential hypertension   bp in fair control at this time  BP Readings from Last 1 Encounters:  12/20/23 131/80   No changes needed Most recent labs reviewed  Disc lifstyle change with low sodium diet and exercise  Continues amlodipine  5 mg daily   Will check at home and report readings Goal below 130 over 80        Endocrine   Hyperlipidemia associated with type 2 diabetes mellitus (HCC)   Disc goals for lipids and reasons to control them Rev last labs with pt Rev low sat fat diet in detail  Crestor  10 mg daily  LDL of 73        Diabetes mellitus treated  with oral medication (HCC)   Lab Results  Component Value Date   HGBA1C 6.7 (H) 12/13/2023   HGBA1C 6.6 (H) 11/12/2022   HGBA1C 6.7 (H) 07/06/2022   Microalb utd  Eye exam recent-sent for report  Metforimin xr 500 mg daily  Doing well with diet  Encouraged more exercise as tolerated         Other   Vitamin D  deficiency   Last vitamin D  Lab Results  Component Value Date   VD25OH 30.68 12/13/2023   Instructed to increase supplement from 2000 to 4000 international units daily       Vitamin B12 deficiency   Lab Results  Component Value Date   VITAMINB12 648 12/13/2023   Better on vit B12 500 mcg daily      Routine general medical examination at a health care facility - Primary   Reviewed health habits including diet and exercise and skin cancer prevention Reviewed appropriate screening tests for age  Also reviewed health mt list, fam hx and immunization status , as well as social and family history   See HPI Labs reviewed and ordered Health Maintenance  Topic Date Due   Eye exam for diabetics  Never done   Pneumococcal Vaccine for age over 49 (1 of 2 - PCV) Never done   Breast Cancer Screening  09/02/2023   Complete foot exam   11/15/2023   Flu Shot  06/05/2024*   COVID-19 Vaccine (3 - Moderna risk series) 01/04/2025*   Hemoglobin A1C  06/12/2024   Yearly kidney function blood test for diabetes  12/12/2024   Yearly kidney health urinalysis for diabetes  12/13/2024  Colon Cancer Screening  01/22/2026   DTaP/Tdap/Td vaccine (3 - Td or Tdap) 11/14/2032   Hepatitis C Screening  Completed   HIV Screening  Completed   Zoster (Shingles) Vaccine  Completed   Hepatitis B Vaccine  Aged Out   HPV Vaccine  Aged Out   Meningitis B Vaccine  Aged Out  *Topic was postponed. The date shown is not the original due date.    Will get lower cost flu shot at pharm or health dept  Filled out form for low cost mammogram Declines std screening  Discussed fall prevention,  supplements and exercise for bone density  PHQ 2 (had to leave job due to health problems)  Sent for eye exam report        Obesity   Discussed how this problem influences overall health and the risks it imposes  Reviewed plan for weight loss with lower calorie diet (via better food choices (lower glycemic and portion control) along with exercise building up to or more than 30 minutes 5 days per week including some aerobic activity and strength training         Encounter for screening mammogram for breast cancer   Given form to fill out for [facility] scholarship [facility] with [facility] to see if reduced cost mammo is option for pt w/o ins       Current use of proton pump inhibitor   Omeprazole  20   Lab Results  Component Value Date   VITAMINB12 648 12/13/2023   Last vitamin D  Lab Results  Component Value Date   VD25OH 30.68 12/13/2023   addressed      Colon cancer screening   Colonoscopy 01/2021 with 5 y recall

## 2023-12-20 NOTE — Assessment & Plan Note (Signed)
 Lab Results  Component Value Date   HGBA1C 6.7 (H) 12/13/2023   HGBA1C 6.6 (H) 11/12/2022   HGBA1C 6.7 (H) 07/06/2022   Microalb utd  Eye exam recent-sent for report  Metforimin xr 500 mg daily  Doing well with diet  Encouraged more exercise as tolerated

## 2023-12-20 NOTE — Assessment & Plan Note (Signed)
 Colonoscopy 01/2021 with 5 y recall

## 2023-12-20 NOTE — Assessment & Plan Note (Signed)
 bp in fair control at this time  BP Readings from Last 1 Encounters:  12/20/23 131/80   No changes needed Most recent labs reviewed  Disc lifstyle change with low sodium diet and exercise  Continues amlodipine  5 mg daily   Will check at home and report readings Goal below 130 over 80

## 2023-12-26 ENCOUNTER — Other Ambulatory Visit: Payer: Self-pay | Admitting: Family Medicine

## 2024-01-02 ENCOUNTER — Other Ambulatory Visit: Payer: Self-pay | Admitting: Family Medicine

## 2024-01-02 ENCOUNTER — Telehealth: Payer: Self-pay

## 2024-01-02 DIAGNOSIS — Z1231 Encounter for screening mammogram for malignant neoplasm of breast: Secondary | ICD-10-CM

## 2024-01-02 NOTE — Telephone Encounter (Signed)
 Telephoned patient at mobile number. Left a voice message with [facility] (scholarship) scheduling contact information.

## 2024-02-09 ENCOUNTER — Inpatient Hospital Stay
Admission: RE | Admit: 2024-02-09 | Discharge: 2024-02-09 | Disposition: A | Payer: Self-pay | Source: Ambulatory Visit | Attending: Family Medicine | Admitting: Family Medicine

## 2024-02-09 DIAGNOSIS — Z1231 Encounter for screening mammogram for malignant neoplasm of breast: Secondary | ICD-10-CM

## 2024-02-19 ENCOUNTER — Ambulatory Visit: Payer: Self-pay | Admitting: Family Medicine

## 2024-02-20 ENCOUNTER — Other Ambulatory Visit: Payer: Self-pay | Admitting: Family Medicine

## 2024-02-20 NOTE — Telephone Encounter (Unsigned)
 Copied from CRM #[id number]. Topic: Clinical - Medication Refill >> Feb 20, 2024  8:11 AM Ahlexyia S wrote: Medication:  potassium chloride  SA (KLOR-CON  M20) 20 MEQ tablet amLODipine  (NORVASC ) 5 MG tablet omeprazole  (PRILOSEC) 20 MG capsule  Has the patient contacted their pharmacy? No, pt decided contacted clinic before pharmacy. (Agent: If no, request that the patient contact the pharmacy for the refill. If patient does not wish to contact the pharmacy document the reason why and proceed with request.) (Agent: If yes, when and what did the pharmacy advise?)  This is the patient's preferred pharmacy:  [facility] [address], [place] - [address] [place] [address] Phone: [phone] Fax: [phone]  Is this the correct pharmacy for this prescription? Yes If no, delete pharmacy and type the correct one.   Has the prescription been filled recently? No  Is the patient out of the medication? Yes  Has the patient been seen for an appointment in the last year OR does the patient have an upcoming appointment? Yes  Can we respond through MyChart? No, prefers phone calls.  Agent: Please be advised that Rx refills may take up to 3 business days. We ask that you follow-up with your pharmacy.

## 2024-02-21 MED ORDER — OMEPRAZOLE 20 MG PO CPDR: 20.0000 mg | DELAYED_RELEASE_CAPSULE | Freq: Every day | ORAL | 2 refills | Status: AC

## 2024-02-21 MED ORDER — AMLODIPINE BESYLATE 5 MG PO TABS: 5.0000 mg | ORAL_TABLET | Freq: Every day | ORAL | 2 refills | Status: AC

## 2024-02-21 MED ORDER — POTASSIUM CHLORIDE CRYS ER 20 MEQ PO TBCR: 20.0000 meq | EXTENDED_RELEASE_TABLET | Freq: Every day | ORAL | 2 refills | Status: AC
